# Patient Record
Sex: Female | Born: 1955 | Race: White | Hispanic: No | Marital: Married | Smoking: Current every day smoker
Health system: Southern US, Community
[De-identification: ages and names within clinical notes are randomized; demographics above are authoritative.]

## PROBLEM LIST (undated history)

## (undated) DIAGNOSIS — E860 Dehydration: Secondary | ICD-10-CM

## (undated) DIAGNOSIS — I10 Essential (primary) hypertension: Secondary | ICD-10-CM

## (undated) DIAGNOSIS — M5126 Other intervertebral disc displacement, lumbar region: Secondary | ICD-10-CM

## (undated) DIAGNOSIS — F32A Depression, unspecified: Secondary | ICD-10-CM

## (undated) DIAGNOSIS — G894 Chronic pain syndrome: Secondary | ICD-10-CM

## (undated) DIAGNOSIS — M545 Low back pain, unspecified: Secondary | ICD-10-CM

## (undated) DIAGNOSIS — I82409 Acute embolism and thrombosis of unspecified deep veins of unspecified lower extremity: Secondary | ICD-10-CM

## (undated) DIAGNOSIS — R252 Cramp and spasm: Secondary | ICD-10-CM

## (undated) DIAGNOSIS — F419 Anxiety disorder, unspecified: Secondary | ICD-10-CM

## (undated) DIAGNOSIS — Z8719 Personal history of other diseases of the digestive system: Secondary | ICD-10-CM

## (undated) DIAGNOSIS — N183 Chronic kidney disease, stage 3 (moderate): Secondary | ICD-10-CM

## (undated) DIAGNOSIS — F329 Major depressive disorder, single episode, unspecified: Secondary | ICD-10-CM

## (undated) HISTORY — DX: Other intervertebral disc displacement, lumbar region: M51.26

## (undated) HISTORY — DX: Low back pain: M54.5

## (undated) HISTORY — DX: Low back pain, unspecified: M54.50

## (undated) HISTORY — DX: Major depressive disorder, single episode, unspecified: F32.9

## (undated) HISTORY — DX: Chronic kidney disease, stage 3 (moderate): N18.3

## (undated) HISTORY — DX: Depression, unspecified: F32.A

## (undated) HISTORY — PX: LUMBAR LAMINECTOMY: SHX95

## (undated) HISTORY — DX: Personal history of other diseases of the digestive system: Z87.19

## (undated) HISTORY — DX: Chronic pain syndrome: G89.4

## (undated) HISTORY — DX: Cramp and spasm: R25.2

## (undated) HISTORY — DX: Anxiety disorder, unspecified: F41.9

## (undated) HISTORY — DX: Essential (primary) hypertension: I10

## (undated) HISTORY — DX: Acute embolism and thrombosis of unspecified deep veins of unspecified lower extremity: I82.409

## (undated) HISTORY — DX: Dehydration: E86.0

---

## 1999-06-15 ENCOUNTER — Ambulatory Visit (HOSPITAL_COMMUNITY): Admission: RE | Admit: 1999-06-15 | Discharge: 1999-06-15 | Payer: Self-pay | Admitting: Neurosurgery

## 1999-06-15 ENCOUNTER — Encounter: Payer: Self-pay | Admitting: Neurosurgery

## 1999-08-12 ENCOUNTER — Encounter: Payer: Self-pay | Admitting: Neurosurgery

## 1999-08-14 ENCOUNTER — Encounter: Payer: Self-pay | Admitting: Neurosurgery

## 1999-08-14 ENCOUNTER — Inpatient Hospital Stay (HOSPITAL_COMMUNITY): Admission: RE | Admit: 1999-08-14 | Discharge: 1999-08-17 | Payer: Self-pay | Admitting: Neurosurgery

## 1999-12-02 ENCOUNTER — Encounter: Payer: Self-pay | Admitting: Neurosurgery

## 1999-12-02 ENCOUNTER — Encounter: Admission: RE | Admit: 1999-12-02 | Discharge: 1999-12-02 | Payer: Self-pay | Admitting: Neurosurgery

## 2000-02-01 ENCOUNTER — Encounter: Admission: RE | Admit: 2000-02-01 | Discharge: 2000-02-01 | Payer: Self-pay | Admitting: Neurosurgery

## 2000-02-01 ENCOUNTER — Encounter: Payer: Self-pay | Admitting: Neurosurgery

## 2000-02-28 ENCOUNTER — Encounter: Payer: Self-pay | Admitting: Neurosurgery

## 2000-02-28 ENCOUNTER — Ambulatory Visit (HOSPITAL_COMMUNITY): Admission: RE | Admit: 2000-02-28 | Discharge: 2000-02-28 | Payer: Self-pay | Admitting: Neurosurgery

## 2000-06-17 ENCOUNTER — Encounter: Admission: RE | Admit: 2000-06-17 | Discharge: 2000-09-15 | Payer: Self-pay | Admitting: Anesthesiology

## 2000-10-06 ENCOUNTER — Encounter: Admission: RE | Admit: 2000-10-06 | Discharge: 2000-12-24 | Payer: Self-pay | Admitting: Anesthesiology

## 2004-10-30 ENCOUNTER — Emergency Department (HOSPITAL_COMMUNITY): Admission: EM | Admit: 2004-10-30 | Discharge: 2004-10-30 | Payer: Self-pay | Admitting: Family Medicine

## 2005-05-11 ENCOUNTER — Emergency Department (HOSPITAL_COMMUNITY): Admission: EM | Admit: 2005-05-11 | Discharge: 2005-05-11 | Payer: Self-pay | Admitting: Family Medicine

## 2005-08-17 ENCOUNTER — Emergency Department (HOSPITAL_COMMUNITY): Admission: EM | Admit: 2005-08-17 | Discharge: 2005-08-17 | Payer: Self-pay | Admitting: Family Medicine

## 2005-09-02 ENCOUNTER — Emergency Department (HOSPITAL_COMMUNITY): Admission: EM | Admit: 2005-09-02 | Discharge: 2005-09-02 | Payer: Self-pay | Admitting: Family Medicine

## 2005-09-03 ENCOUNTER — Emergency Department (HOSPITAL_COMMUNITY): Admission: EM | Admit: 2005-09-03 | Discharge: 2005-09-03 | Payer: Self-pay | Admitting: Family Medicine

## 2005-09-21 ENCOUNTER — Ambulatory Visit: Payer: Self-pay | Admitting: Internal Medicine

## 2005-09-23 ENCOUNTER — Ambulatory Visit (HOSPITAL_COMMUNITY): Admission: RE | Admit: 2005-09-23 | Discharge: 2005-09-23 | Payer: Self-pay | Admitting: Internal Medicine

## 2005-11-24 ENCOUNTER — Emergency Department (HOSPITAL_COMMUNITY): Admission: EM | Admit: 2005-11-24 | Discharge: 2005-11-24 | Payer: Self-pay | Admitting: Family Medicine

## 2006-06-17 ENCOUNTER — Emergency Department (HOSPITAL_COMMUNITY): Admission: EM | Admit: 2006-06-17 | Discharge: 2006-06-17 | Payer: Self-pay | Admitting: Family Medicine

## 2006-08-05 ENCOUNTER — Emergency Department (HOSPITAL_COMMUNITY): Admission: EM | Admit: 2006-08-05 | Discharge: 2006-08-05 | Payer: Self-pay | Admitting: Emergency Medicine

## 2007-10-27 ENCOUNTER — Emergency Department (HOSPITAL_COMMUNITY): Admission: EM | Admit: 2007-10-27 | Discharge: 2007-10-27 | Payer: Self-pay | Admitting: Emergency Medicine

## 2009-06-03 ENCOUNTER — Emergency Department (HOSPITAL_COMMUNITY): Admission: EM | Admit: 2009-06-03 | Discharge: 2009-06-03 | Payer: Self-pay | Admitting: Family Medicine

## 2009-07-16 ENCOUNTER — Encounter: Payer: Self-pay | Admitting: Internal Medicine

## 2009-07-16 ENCOUNTER — Ambulatory Visit: Payer: Self-pay | Admitting: Internal Medicine

## 2009-07-16 ENCOUNTER — Telehealth: Payer: Self-pay | Admitting: Internal Medicine

## 2009-07-16 DIAGNOSIS — I10 Essential (primary) hypertension: Secondary | ICD-10-CM | POA: Insufficient documentation

## 2009-07-16 DIAGNOSIS — M5126 Other intervertebral disc displacement, lumbar region: Secondary | ICD-10-CM

## 2009-07-16 DIAGNOSIS — F418 Other specified anxiety disorders: Secondary | ICD-10-CM

## 2009-07-16 DIAGNOSIS — F329 Major depressive disorder, single episode, unspecified: Secondary | ICD-10-CM

## 2009-07-16 DIAGNOSIS — N189 Chronic kidney disease, unspecified: Secondary | ICD-10-CM

## 2009-07-16 DIAGNOSIS — F32A Depression, unspecified: Secondary | ICD-10-CM | POA: Insufficient documentation

## 2009-07-16 HISTORY — DX: Other intervertebral disc displacement, lumbar region: M51.26

## 2009-07-17 ENCOUNTER — Telehealth: Payer: Self-pay | Admitting: Internal Medicine

## 2009-07-17 LAB — CONVERTED CEMR LAB
ALT: 10 units/L (ref 0–35)
AST: 11 units/L (ref 0–37)
Barbiturate Quant, Ur: NEGATIVE
Benzodiazepines.: POSITIVE — AB
Chloride: 102 meq/L (ref 96–112)
Creatinine,U: 41.5 mg/dL
Glucose, Bld: 124 mg/dL — ABNORMAL HIGH (ref 70–99)
Methadone: NEGATIVE
Opiates: POSITIVE — AB
Sodium: 137 meq/L (ref 135–145)

## 2009-10-07 ENCOUNTER — Ambulatory Visit (HOSPITAL_COMMUNITY): Admission: RE | Admit: 2009-10-07 | Discharge: 2009-10-07 | Payer: Self-pay | Admitting: Internal Medicine

## 2009-10-07 ENCOUNTER — Ambulatory Visit: Payer: Self-pay | Admitting: Internal Medicine

## 2009-10-16 ENCOUNTER — Ambulatory Visit (HOSPITAL_COMMUNITY): Admission: RE | Admit: 2009-10-16 | Discharge: 2009-10-16 | Payer: Self-pay | Admitting: Internal Medicine

## 2009-10-17 ENCOUNTER — Emergency Department (HOSPITAL_COMMUNITY): Admission: EM | Admit: 2009-10-17 | Discharge: 2009-10-17 | Payer: Self-pay | Admitting: Family Medicine

## 2009-10-22 ENCOUNTER — Telehealth: Payer: Self-pay | Admitting: *Deleted

## 2009-10-28 ENCOUNTER — Telehealth: Payer: Self-pay | Admitting: *Deleted

## 2009-11-06 ENCOUNTER — Ambulatory Visit: Payer: Self-pay | Admitting: Internal Medicine

## 2009-11-10 ENCOUNTER — Encounter: Payer: Self-pay | Admitting: Internal Medicine

## 2009-11-13 ENCOUNTER — Telehealth: Payer: Self-pay | Admitting: *Deleted

## 2009-11-14 ENCOUNTER — Encounter: Payer: Self-pay | Admitting: Internal Medicine

## 2009-11-21 ENCOUNTER — Telehealth: Payer: Self-pay | Admitting: *Deleted

## 2009-11-27 ENCOUNTER — Telehealth: Payer: Self-pay | Admitting: Internal Medicine

## 2009-12-03 ENCOUNTER — Telehealth: Payer: Self-pay | Admitting: *Deleted

## 2010-01-08 ENCOUNTER — Telehealth: Payer: Self-pay | Admitting: Internal Medicine

## 2010-01-29 ENCOUNTER — Telehealth: Payer: Self-pay | Admitting: Internal Medicine

## 2010-02-09 ENCOUNTER — Ambulatory Visit: Payer: Self-pay | Admitting: Internal Medicine

## 2010-02-09 DIAGNOSIS — F17200 Nicotine dependence, unspecified, uncomplicated: Secondary | ICD-10-CM

## 2010-02-18 ENCOUNTER — Encounter
Admission: RE | Admit: 2010-02-18 | Discharge: 2010-03-02 | Payer: Self-pay | Source: Home / Self Care | Attending: Physical Medicine & Rehabilitation | Admitting: Physical Medicine & Rehabilitation

## 2010-02-20 ENCOUNTER — Telehealth: Payer: Self-pay | Admitting: Licensed Clinical Social Worker

## 2010-02-23 ENCOUNTER — Encounter: Payer: Self-pay | Admitting: Internal Medicine

## 2010-02-23 ENCOUNTER — Telehealth (INDEPENDENT_AMBULATORY_CARE_PROVIDER_SITE_OTHER): Payer: Self-pay | Admitting: *Deleted

## 2010-02-23 ENCOUNTER — Ambulatory Visit: Payer: Self-pay | Admitting: Internal Medicine

## 2010-02-23 ENCOUNTER — Ambulatory Visit (HOSPITAL_COMMUNITY): Admission: RE | Admit: 2010-02-23 | Discharge: 2010-02-23 | Payer: Self-pay | Admitting: Internal Medicine

## 2010-02-23 LAB — CONVERTED CEMR LAB
CO2: 25 meq/L (ref 19–32)
Chloride: 102 meq/L (ref 96–112)
Glucose, Bld: 113 mg/dL — ABNORMAL HIGH (ref 70–99)
MCHC: 33.5 g/dL (ref 30.0–36.0)
MCV: 90.4 fL (ref >=78.0)
Potassium: 4.1 meq/L (ref 3.5–5.3)
RDW: 12.5 % (ref 11.5–15.5)
Sodium: 136 meq/L (ref 135–145)
Total Bilirubin: 0.4 mg/dL (ref 0.3–1.2)
VLDL: 24 mg/dL (ref 0–40)
WBC: 9.5 10*3/uL (ref 4.0–10.5)

## 2010-03-02 ENCOUNTER — Ambulatory Visit: Payer: Self-pay | Admitting: Physical Medicine & Rehabilitation

## 2010-03-06 ENCOUNTER — Telehealth: Payer: Self-pay | Admitting: Internal Medicine

## 2010-03-12 ENCOUNTER — Telehealth: Payer: Self-pay | Admitting: Internal Medicine

## 2010-03-16 ENCOUNTER — Encounter: Payer: Self-pay | Admitting: Internal Medicine

## 2010-03-16 ENCOUNTER — Ambulatory Visit: Payer: Self-pay | Admitting: Internal Medicine

## 2010-03-18 LAB — CONVERTED CEMR LAB
Amphetamine Screen, Ur: NEGATIVE
Benzodiazepines.: POSITIVE — AB
Methadone: NEGATIVE

## 2010-03-23 ENCOUNTER — Encounter: Payer: Self-pay | Admitting: Internal Medicine

## 2010-03-23 ENCOUNTER — Encounter: Payer: Self-pay | Admitting: Licensed Clinical Social Worker

## 2010-04-06 ENCOUNTER — Ambulatory Visit: Payer: Self-pay | Admitting: Family Medicine

## 2010-04-13 ENCOUNTER — Telehealth: Payer: Self-pay | Admitting: *Deleted

## 2010-04-15 ENCOUNTER — Encounter: Payer: Self-pay | Admitting: Internal Medicine

## 2010-04-15 ENCOUNTER — Ambulatory Visit: Payer: Self-pay | Admitting: Internal Medicine

## 2010-04-15 LAB — CONVERTED CEMR LAB
Amphetamine Screen, Ur: NEGATIVE
Barbiturate Quant, Ur: NEGATIVE
Benzodiazepines.: NEGATIVE
Cocaine Metabolites: NEGATIVE
Marijuana Metabolite: NEGATIVE
Opiates: POSITIVE — AB
Phencyclidine (PCP): NEGATIVE
Propoxyphene: NEGATIVE

## 2010-05-13 ENCOUNTER — Telehealth: Payer: Self-pay | Admitting: *Deleted

## 2010-05-13 ENCOUNTER — Encounter: Payer: Self-pay | Admitting: Internal Medicine

## 2010-05-13 ENCOUNTER — Telehealth: Payer: Self-pay | Admitting: Internal Medicine

## 2010-05-17 ENCOUNTER — Encounter: Payer: Self-pay | Admitting: Internal Medicine

## 2010-05-26 NOTE — Letter (Signed)
Summary: BACK PAIN [facility]  BACK PAIN [facility]   Imported By: Margie Billet 03/23/2010 15:42:57  _____________________________________________________________________  External Attachment:    Type:   Image     Comment:   External Document

## 2010-05-26 NOTE — Assessment & Plan Note (Signed)
Summary: EST-2 WEEK RECHECK/CH   Vital Signs:  Patient profile:   55 year old female Height:      66 inches (167.64 cm) Weight:      195.0 pounds (88.64 kg) BMI:     31.59 Temp:     9806 degrees F (5430 degrees C) oral Pulse rate:   71 / minute BP sitting:   124 / 83  (right arm) Cuff size:   regular  Vitals Entered By: Theotis Barrio NT II (November 06, 2009 4:08 PM) CC: COMPLAIN OF NECK-BACK-KNEES-LEFT ELBOW- PAIN /  2 WEEKK FOLLOW UP APPT,  Is Patient Diabetic? No Pain Assessment Patient in pain? yes     Location: NECK,BACK,KNEE, ELBOW Intensity:  8  /   7 Onset of pain  Chronic Nutritional Status BMI of > 30 = obese  Have you ever been in a relationship where you felt threatened, hurt or afraid?No   Does patient need assistance? Functional Status Self care Ambulation Normal   Primary Care Provider:  Lars Mage MD  CC:  COMPLAIN OF NECK-BACK-KNEES-LEFT ELBOW- PAIN /  2 WEEKK FOLLOW UP APPT and .  History of Present Illness: 55 year old woman with PMH of chronic back and neck pain, history of positive UDS with opioids and benzodiazepines without presciption, and insomnia presents for a visit with following complaints:  Neck pain: Shooting, stabbing pain in C7 to T2 region. Feels like ice picks on both sides. Has been worsened for the last 10 days to 2 weeks. Pain comes and goes (sharp pain). Pain in C7 has been constant for at least 2 months. Certain movements exacerbate the pain. Flexion of neck and lateral motion worsen the pain. Extension of neck helps the pain some, but does not take it away completely Heat and cold do not help it. Vibration does not help. At least 6/10, at worst 10/10. She take 2-3 tabs of Morphine 30mg  from her friend and also takes Benzos(Xanax)  occasionally again borrowed from her friend.  Went to urgent care center 3 weeks ago for left elbow pain and wearing brace today. She said that she was advised to go to an orthopedician.  Lower back pain s/p  lumbar laminectomy: R side near hip - sharp and burning pain. When not sharp and burning, aching, feels like a throbbing toothache like pain. Has had the pain evaluated in the past, in same area again today. Has been ongoing at more severe level of intensity for the 10-14 days. At least 6/10, at worst 10/10  Insomnia: Feels worn out - has been sleeping less and less. 3-4 hours of sleep per night. Insomnia has been ongoing for "quite a while now". Has gotten worse over the past 3-4 months. Has not tried anything. As for medications taken in the past, xanax seemed to help in the past. Had trouble after surgery in the past with sleep. Tried neurontin, topamax and seroquel - had problems with first two, but did well with seroquel. Agreed to try seroquel today.  Smokes 1/2 to 1 ppd and not reday to quit. Says that she quit 7 years ago and will try again. Does not need help for quitting.  Depression History:      The patient denies a depressed mood most of the day and a diminished interest in her usual daily activities.         Preventive Screening-Counseling & Management  Alcohol-Tobacco     Smoking Status: current     Smoking Cessation Counseling: yes  Packs/Day: 0.75  Caffeine-Diet-Exercise     Does Patient Exercise: yes     Type of exercise: WALKING DOGS 3  Problems Prior to Update: 1)  Insomnia Unspecified  (ICD-780.52) 2)  Chronic Kidney Disease Unspecified  (ICD-585.9) 3)  Low Back Pain  (ICD-724.2) 4)  Hypertension  (ICD-401.9) 5)  Depression  (ICD-311) 6)  Anxiety  (ICD-300.00) 7)  Abscess, Tooth  (ICD-522.5) 8)  Inadequate Material Resources  (ICD-V60.2) 9)  Neck Pain, Chronic  (ICD-723.1) 10)  Depression, Hx of  (ICD-V11.8) 11)  Back Pain, Chronic  (ICD-724.5)  Medications Prior to Update: 1)  Lisinopril 20 Mg Tabs (Lisinopril) .... Take 1 Tablet By Mouth Once A Day 2)  Trazodone Hcl 150 Mg Tabs (Trazodone Hcl) .... Take 1 Tab By Mouth At Bedtime As Needed For  Insomnia  Current Medications (verified): 1)  Lisinopril 20 Mg Tabs (Lisinopril) .... Take 1 Tablet By Mouth Once A Day 2)  Seroquel 100 Mg Tabs (Quetiapine Fumarate) .... Take 1 Tab By Mouth Once Daily 3)  Cyclobenzaprine Hcl 5 Mg Tabs (Cyclobenzaprine Hcl) .... .1-2 Tabs By Mouth Every 8-12 Hours As Needed For Back Pain.  Allergies (verified): 1)  ! Steroids 2)  ! Ambien (Zolpidem Tartrate) 3)  ! Sulfa 4)  ! Neurontin (Gabapentin) 5)  ! Amoxicillin 6)  ! Ultram  Past History:  Past Medical History: Last updated: 07/16/2009 Anxiety Depression Hypertension Low back pain  Past Surgical History: Last updated: 07/16/2009 Lumbar laminectomy  Social History: Last updated: 10/07/2009 Former hairdresser, currently unemployed. Live at home with husband and 3 dogs Smokes 1 ppd Does not drink alcohol Denies any other drug abuse  Risk Factors: Exercise: yes (11/06/2009)  Risk Factors: Smoking Status: current (11/06/2009) Packs/Day: 0.75 (11/06/2009)  Social History: Does Patient Exercise:  yes  Review of Systems      See HPI  Physical Exam  Additional Exam:  Gen: AOx3, in no acute distress Eyes: PERRL, EOMI ENT:MMM, No erythema noted in posterior pharynx Neck: No JVD, No LAP Chest: CTAB with  good respiratory effort CVS: regular rhythmic rate, NO M/R/G, S1 S2 normal Abdo: soft,ND, BS+x4, Non tender and No hepatosplenomegaly EXT: No odema noted Neuro: Non focal, using cane for assistance in walking Skin: no rashes noted.    Impression & Recommendations:  Problem # 1:  INSOMNIA UNSPECIFIED (ICD-780.52) Assessment Deteriorated I will prescribe her seroquel that she was on before and it use to help. She asidthat she used 100 mg tabs and cut them into quarters as its cheaper for her. I will prescribe her 30 tabs of 100 mg today. Discussed sleep hygiene.   Problem # 2:  HYPERTENSION (ICD-401.9) Assessment: Unchanged Well controlled on current meds. Bmet with  in last 6 months. Her updated medication list for this problem includes:    Lisinopril 20 Mg Tabs (Lisinopril) .Marland Kitchen... Take 1 tablet by mouth once a day  BP today: 124/83 Prior BP: 120/80 (10/07/2009)  Labs Reviewed: K+: 4.2 (07/16/2009) Creat: : 1.42 (07/16/2009)     Problem # 3:  DEPRESSION (ICD-311) Assessment: Comment Only She was prescribed Trazodone but she never refilled the presription because she thinks that it will not work with her. I will prescribe her seroquel today and will follow up. She is not suicidal or homicidal at this time. She will need a psych evaluation in future as I feel she will benefit from behaviour modification and psychotherapy.   The following medications were removed from the medication list:    Trazodone Hcl  150 Mg Tabs (Trazodone hcl) .Marland Kitchen... Take 1 tab by mouth at bedtime as needed for insomnia  Discussed treatment options, including trial of antidpressant medication. Will refer to behavioral health. Follow-up call in in 24-48 hours and recheck in 2 weeks, sooner as needed. Patient agrees to call if any worsening of symptoms or thoughts of doing harm arise. Verified that the patient has no suicidal ideation at this time.   Problem # 4:  NECK PAIN, CHRONIC (ICD-723.1) Assessment: Comment Only MRI reviewed and I discused the results with her. Will refer her to Pain clinic today. Discussed with Dr Aundria Rud. She is taking non prescribed narcotics and Xanax. I advised her to stop using non prescribed meds but she says that she need something to control her pains.  Her updated medication list for this problem includes:    Cyclobenzaprine Hcl 5 Mg Tabs (Cyclobenzaprine hcl) ..... .1-2 tabs by mouth every 8-12 hours as needed for back pain.  Orders: Pain Clinic Referral (Pain)  Discussed exercises and use of moist heat or cold and medication.   Problem # 5:  Preventive Health Care (ICD-V70.0) Assessment: Comment Only Tetanus and hemmoccults cards given  today.  Complete Medication List: 1)  Lisinopril 20 Mg Tabs (Lisinopril) .... Take 1 tablet by mouth once a day 2)  Seroquel 100 Mg Tabs (Quetiapine fumarate) .... Take 1 tab by mouth once daily 3)  Cyclobenzaprine Hcl 5 Mg Tabs (Cyclobenzaprine hcl) .... .1-2 tabs by mouth every 8-12 hours as needed for back pain.  Other Orders: T-Hemoccult Card-Multiple (take home) ([id number]) Tdap => 83yrs IM (19147) Prescriptions: CYCLOBENZAPRINE HCL 5 MG TABS (CYCLOBENZAPRINE HCL) .1-2 tabs by mouth every 8-12 hours as needed for back pain.  #60 x 0   Entered and Authorized by:   Lars Mage MD   Signed by:   Lars Mage MD on 11/06/2009   Method used:   Print then Give to Patient   RxID:   [id number] SEROQUEL 100 MG TABS (QUETIAPINE FUMARATE) take 1 tab by mouth once daily  #30 x 0   Entered and Authorized by:   Lars Mage MD   Signed by:   Lars Mage MD on 11/06/2009   Method used:   Print then Give to Patient   RxID:   [id number]   Prevention & Chronic Care Immunizations   Influenza vaccine: Not documented   Influenza vaccine deferral: Deferred  (11/06/2009)    Tetanus booster: 11/06/2009: Tdap   Td booster deferral: Deferred  (07/16/2009)    Pneumococcal vaccine: Not documented  Colorectal Screening   Hemoccult: Not documented   Hemoccult action/deferral: Ordered  (11/06/2009)    Colonoscopy: Not documented   Colonoscopy action/deferral: Refused  (11/06/2009)  Other Screening   Pap smear: Not documented   Pap smear action/deferral: GYN Referral  (10/07/2009)    Mammogram: Not documented   Mammogram action/deferral: Ordered  (10/07/2009)   Smoking status: current  (11/06/2009)   Smoking cessation counseling: yes  (11/06/2009)  Lipids   Total Cholesterol: Not documented   Lipid panel action/deferral: Deferred   LDL: Not documented   LDL Direct: Not documented   HDL: Not documented   Triglycerides: Not documented  Hypertension   Last Blood Pressure: 124 / 83   (11/06/2009)   Serum creatinine: 1.42  (07/16/2009)   Serum potassium 4.2  (07/16/2009)    Hypertension flowsheet reviewed?: Yes   Progress toward BP goal: At goal  Self-Management Support :   Personal Goals (by the next clinic visit) :  Personal blood pressure goal: 140/90  (07/16/2009)   Patient will work on the following items until the next clinic visit to reach self-care goals:     Medications and monitoring: take my medicines every day, bring all of my medications to every visit  (11/06/2009)     Eating: drink diet soda or water instead of juice or soda, eat more vegetables, use fresh or frozen vegetables, eat foods that are low in salt, eat baked foods instead of fried foods, limit or avoid alcohol  (11/06/2009)    Hypertension self-management support: Written self-care plan, Resources for patients handout  (11/06/2009)   Hypertension self-care plan printed.    Self-management comments: PATIET STATES SHE WALKS THE DOGS A SHORT DISTANCE      Resource handout printed.   Nursing Instructions: Give tetanus booster today Provide Hemoccult cards with instructions (see order)     Tetanus/Td Vaccine    Vaccine Type: Tdap    Site: left deltoid    Mfr: [employer]    Dose: 0.5 ml    Route: IM    Given by: Stanton Kidney Ditzler RN    Exp. Date: 10/24/2011    Lot #: [id number]    VIS given: 03/14/07 version given November 06, 2009.

## 2010-05-26 NOTE — Assessment & Plan Note (Signed)
Summary: NEW PT TO CLINIC;INFO IN ECHART/DS/HTN   Vital Signs:  Patient profile:   55 year old female Height:      66 inches (167.64 cm) Weight:      197.8 pounds (89.91 kg) BMI:     32.04 Temp:     99.3 degrees F (37.39 degrees C) oral Pulse rate:   77 / minute BP sitting:   107 / 73  (right arm) Cuff size:   large  Vitals Entered By: [name]) (July 16, 2009 10:42 AM) CC: new to clinic, c/o tension HAs, back pain x "years" Is Patient Diabetic? No Pain Assessment Patient in pain? 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 Functional Status Self care Ambulation Impaired:Risk for fall Comments cane   Primary Care Provider:  Lars Mage MD  CC:  new to clinic, c/o tension HAs, and back pain x "years".  History of Present Illness: Marie Willis previously known as Marie Willis is here today to [facility] care with [place]. She has past medicical history most notable for severe back pain 2/2 herniated lumbar disc s/p op in 2001 followed by Dr Ricka Burdock for a while,. apparaently there were complications to the procedure and she had to be bed bound alomost 6-8 months. She was chronic pain meds untill 2007 is oxycintin 40 mg bid  and stopped following with them due to lack of resources.  She was following with Dr Jorge Ny at [facility] who was the last orthopedics who saw her in 2008.   She comes in today with this neck pain, she says that she has ahd that for almost 15 years now and it comes and goes in bouts. The latest bout started about 2 months ago when she started having pain in the neck region, sharp, radiating to left shoulder, 8/10 at its worse, no aggravating factors and relieved by taking narcotics borrowed  from her friends. No tingling or numbness in her left arm. The pain has been persistent and progressively getting worse.  Smokes 1/2 a pack a day and not ready to quit at this time.  Preventive Screening-Counseling & Management  Alcohol-Tobacco     Smoking Status: current     Smoking Cessation Counseling: yes     Packs/Day: 0.75  Problems Prior to Update: None  Medications Prior to Update: 1)  None  Current Medications (verified): 1)  Lisinopril 20 Mg Tabs (Lisinopril) .... Take 1 Tablet By Mouth Once A Day 2)  Cyclobenzaprine Hcl 5 Mg Tabs (Cyclobenzaprine Hcl) .... .take 1-2 Tabs Every 8 Hours For Muscle Relaxation As Needed 3)  Clindamycin Hcl 300 Mg Caps (Clindamycin Hcl) .... Take 2 Tabs Every 8 Hours For 7 Days 4)  Tramadol Hcl 50 Mg Tabs (Tramadol Hcl) .... Take 1 Tab Every 6 Hours As Needed For Pain Control  Allergies (verified): 1)  ! Steroids 2)  ! Ambien (Zolpidem Tartrate) 3)  ! Sulfa 4)  ! Neurontin (Gabapentin) 5)  ! Amoxicillin  Past History:  Past Medical History: Anxiety Depression Hypertension Low back pain  Past Surgical History: Lumbar laminectomy  Social History: Smoking Status:  current Packs/Day:  0.75  Review of Systems      See HPI  Physical Exam  Additional Exam:  Gen: AOx3, in some acute distress, uses cane for walking Eyes: PERRL, EOMI ENT: MMM, No erythema noted in posterior pharynx Neck: No JVD, No LAP,  Chest: CTAB with  good respiratory effort CVS: regular rhythmic rate, NO M/R/G, S1 S2 normal Abdo: soft,ND, BS+x4, Non tender and No hepatosplenomegaly EXT: No odema noted Neuro: Non focal, gait is altered due to back pain Skin: no rashes noted.  back: She has lower lumbar tenderness along with paravertebral muscle tightness                        Cervical tenderness along with paraspinal muscle tenderness.   Impression & Recommendations:  Problem # 1:  LOW BACK PAIN (ICD-724.2) Assessment Unchanged Patient ahs had a  very stormy course of illness and her back pain has been a big issue in her life during last 10 years. 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 She was on oxycontin 40mg  Bid for a long period of time untill she ran out of money and could not afford pain Clinic. I have asked for records from pain clinic, ortho office, neurology clinic and headache clinic today. She says that she has been taking narcotics pain meds from her meds every now and then for last 3 years.  Her updated medication list for this problem includes:    Cyclobenzaprine Hcl 5 Mg Tabs (Cyclobenzaprine hcl) ..... Marland Kitchentake 1-2 tabs every 8 hours for muscle relaxation as needed    Tramadol Hcl 50 Mg Tabs (Tramadol hcl) .Marland Kitchen... Take 1 tab every 6 hours as needed for pain control  Discussed use of moist heat or ice, modified activities, medications, and stretching/strengthening exercises. Back care instructions given. To be seen in 2 weeks if no improvement; sooner if worsening of symptoms.   Problem # 2:  NECK PAIN, CHRONIC (ICD-723.1) Assessment: Deteriorated We do not have any record of her neck pain and there are no images in the echart. I discussed that she may require an MRI for fresh evaluation of her neck. She does have objective evidence of tenderness in C7 area and muscle spasm/stiffness all around. I will prescribe her flexeril for 2-3 weaks along with some tramadol. I discussed warm compresses and hot messages as some of the things which might help with the spasm. The decision was made not to start any Narcotics at this time since we know very little about her.  We ran her name through Narcotic database and she did recieve few narcotics in Feb fro dental abscess but she hasnt been on any narcotics for atleast last 6 months and we would like to maintain her like that. We will get a UDS today. Her updated medication list for this problem includes:    Cyclobenzaprine Hcl 5 Mg Tabs  (Cyclobenzaprine hcl) ..... Marland Kitchentake 1-2 tabs every 8 hours for muscle relaxation as needed    Tramadol Hcl 50 Mg Tabs (Tramadol hcl) .Marland Kitchen... Take 1 tab every 6 hours as needed for pain control  Problem # 3:  HYPERTENSION (ICD-401.9) Assessment: Improved Well controlled. Will refill her meds and obtain Cmet for K and Crt. Her updated medication list for this problem includes:    Lisinopril 20 Mg Tabs (Lisinopril) .Marland Kitchen... Take 1 tablet by mouth once a day  BP today: 107/73  Problem # 4:  ABSCESS, TOOTH (ICD-522.5) Assessment: Deteriorated She does complain of soreness in her left lower tooth  region along with fevers. Her temp is 99.6. I will give her prescription of Clindamycin for 7 days at this time for treatment of possible abscess. Orders: T-HIV Antibody  (Reflex) ([id number])  Problem # 5:  INADEQUATE MATERIAL RESOURCES (ICD-V60.2) Assessment: Comment Only Patrecia Pace e debra hill for assistance.  Problem # 6:  Preventive Health Care (ICD-V70.0) Assessment: Comment Only Not discussed during this visit.  Complete Medication List: 1)  Lisinopril 20 Mg Tabs (Lisinopril) .... Take 1 tablet by mouth once a day 2)  Cyclobenzaprine Hcl 5 Mg Tabs (Cyclobenzaprine hcl) .... .take 1-2 tabs every 8 hours for muscle relaxation as needed 3)  Clindamycin Hcl 300 Mg Caps (Clindamycin hcl) .... Take 2 tabs every 8 hours for 7 days 4)  Tramadol Hcl 50 Mg Tabs (Tramadol hcl) .... Take 1 tab every 6 hours as needed for pain control  Other Orders: T-Comprehensive Metabolic Panel ([id number]) T-Drug Screen-Urine, (single) ([id number])  Patient Instructions: 1)  Please schedule a follow-up appointment as needed. 2)  Limit your Sodium (Salt). 3)  Tobacco is very bad for your health and your loved ones! You Should stop smoking!. 4)  Stop Smoking Tips: Choose a Quit date. Cut down before the Quit date. decide what you will do as a substitute when you feel the urge to smoke(gum,toothpick,exercise). 5)  You  need to lose weight. Consider a lower calorie diet and regular exercise.  6)  Schedule your mammogram. 7)  Schedule a colonoscopy/sigmoidoscopy to help detect colon cancer. 8)  You need to have a Pap Smear to prevent cervical cancer. 9)  Check your Blood Pressure regularly. If it is above: you should make an appointment. 10)  Take your antibiotic as prescribed until ALL of it is gone, but stop if you develop a rash or swelling and contact our office as soon as possible. Prescriptions: CLINDAMYCIN HCL 300 MG CAPS (CLINDAMYCIN HCL) take 2 tabs every 6 hours for 7 days  #42 x 0   Entered and Authorized by:   Lars Mage MD   Signed by:   Lars Mage MD on 07/16/2009   Method used:   Print then Give to Patient   RxID:   [id number] TRAMADOL HCL 50 MG TABS (TRAMADOL HCL) take 1 tab every 6 hours as needed for pain control  #60 x 0   Entered and Authorized by:   Lars Mage MD   Signed by:   Lars Mage MD on 07/16/2009   Method used:   Print then Give to Patient   RxID:   [id number] CLINDAMYCIN HCL 300 MG CAPS (CLINDAMYCIN HCL) take 2 tabs every 6 hours for 7 days  #56 x 0   Entered and Authorized by:   Lars Mage MD   Signed by:   Lars Mage MD on 07/16/2009   Method used:   Print then Give to Patient   RxID:   [id number] CYCLOBENZAPRINE HCL 5 MG TABS (CYCLOBENZAPRINE HCL) .take 1-2 tabs every 8 hours for muscle relaxation as needed  #60 x 1   Entered and Authorized by:   Lars Mage MD   Signed by:   Lars Mage MD on 07/16/2009   Method used:   Print then Give to Patient   RxID:   [id number] LISINOPRIL 20 MG TABS (LISINOPRIL) Take 1 tablet by mouth once a day  #30 x 11   Entered and Authorized by:   Lars Mage MD   Signed by:   Lars Mage MD on 07/16/2009   Method used:  Print then Give to Patient   RxID:   [id number]  Process Orders Check Orders Results:     [facility]: ABN not required for this insurance Tests Sent for requisitioning (July 16, 2009 1:35 PM):     07/16/2009: [facility] -- T-Comprehensive Metabolic Panel [[id number]] (signed)     07/16/2009: [facility] -- T-HIV Antibody  (Reflex) [[id number]] (signed)     07/16/2009: [facility] -- T-Drug Screen-Urine, (single) [[id number]] (signed)    Prevention & Chronic Care Immunizations   Influenza vaccine: Not documented   Influenza vaccine deferral: Deferred  (07/16/2009)    Tetanus booster: Not documented   Td booster deferral: Deferred  (07/16/2009)    Pneumococcal vaccine: Not documented  Colorectal Screening   Hemoccult: Not documented   Hemoccult action/deferral: Deferred  (07/16/2009)    Colonoscopy: Not documented   Colonoscopy action/deferral: Deferred  (07/16/2009)  Other Screening   Pap smear: Not documented   Pap smear action/deferral: Deferred  (07/16/2009)    Mammogram: Not documented   Mammogram action/deferral: Deferred  (07/16/2009)   Smoking status: current  (07/16/2009)   Smoking cessation counseling: yes  (07/16/2009)  Lipids   Total Cholesterol: Not documented   Lipid panel action/deferral: Deferred   LDL: Not documented   LDL Direct: Not documented   HDL: Not documented   Triglycerides: Not documented  Hypertension   Last Blood Pressure: 107 / 73  (07/16/2009)   Serum creatinine: Not documented   Serum potassium Not documented CMP ordered     Hypertension flowsheet reviewed?: Yes   Progress toward BP goal: At goal  Self-Management Support :   Personal Goals (by the next clinic visit) :      Personal blood pressure goal: 140/90  (07/16/2009)   Patient will work on the following items until the next clinic visit to reach self-care goals:     Medications and monitoring: take my medicines every day  (07/16/2009)     Eating: eat foods that are low in salt, eat baked foods instead of fried foods  (07/16/2009)    Hypertension self-management support: Pre-printed  educational material, Education handout, Resources for patients handout  (07/16/2009)   Hypertension education handout printed      Resource handout printed.    Appended Document: Orders Update    Clinical Lists Changes  Problems: Added new problem of SPECIAL SCREENING FOR MALIGNANT NEOPLASMS COLON (ICD-V76.51) Added new problem of CHRONIC KIDNEY DISEASE UNSPECIFIED (ICD-585.9) Orders: Added new Test order of Mammogram (Screening) (Mammo) - Signed Added new Referral order of Gynecologic Referral (Gyn) - Signed Added new Referral order of Gastroenterology Referral (GI) - Signed Observations: Added new observation of MAMMRECACT: Ordered (07/16/2009 15:19) Added new observation of NURSEINSTR: GI referral for screening colonoscopy (see order) Schedule screening mammogram (see order)  (07/16/2009 15:19) Added new observation of COLONRECACT: GI referral (07/16/2009 15:19) Added new observation of DM PROGRESS: N/A (07/16/2009 15:19) Added new observation of DM FSREVIEW: N/A (07/16/2009 15:19) Added new observation of LIPID PROGRS: N/A (07/16/2009 15:19) Added new observation of LIPID FSREVW: N/A (07/16/2009 15:19)          Prevention & Chronic Care Immunizations   Influenza vaccine: Not documented   Influenza vaccine deferral: Deferred  (07/16/2009)    Tetanus booster: Not documented   Td booster deferral: Deferred  (07/16/2009)    Pneumococcal vaccine: Not documented  Colorectal Screening   Hemoccult: Not documented   Hemoccult action/deferral: Deferred  (07/16/2009)    Colonoscopy: Not documented  Colonoscopy action/deferral: GI referral  (07/16/2009)  Other Screening   Pap smear: Not documented   Pap smear action/deferral: Deferred  (07/16/2009)    Mammogram: Not documented   Mammogram action/deferral: Ordered  (07/16/2009)   Smoking status: current  (07/16/2009)   Smoking cessation counseling: yes  (07/16/2009)  Lipids   Total Cholesterol: Not  documented   Lipid panel action/deferral: Deferred   LDL: Not documented   LDL Direct: Not documented   HDL: Not documented   Triglycerides: Not documented  Hypertension   Last Blood Pressure: 107 / 73  (07/16/2009)   Serum creatinine: Not documented   Serum potassium Not documented  Self-Management Support :   Personal Goals (by the next clinic visit) :      Personal blood pressure goal: 140/90  (07/16/2009)   Hypertension self-management support: Pre-printed educational material, Education handout, Resources for patients handout  (07/16/2009)   Nursing Instructions: GI referral for screening colonoscopy (see order) Schedule screening mammogram (see order)

## 2010-05-26 NOTE — Assessment & Plan Note (Signed)
Summary: EST-1 MONTH F/U VISIT/CH   Vital Signs:  Patient profile:   55 year old female Height:      66 inches (167.64 cm) Weight:      205.9 pounds (93.59 kg) BMI:     33.35 Temp:     99.5 degrees F (37.50 degrees C) oral Pulse rate:   103 / minute BP sitting:   132 / 88  (right arm)  Vitals Entered By: [name] Duncan Dull) (March 16, 2010 2:45 PM) CC: f/u anxiety Is Patient Diabetic? No Pain Assessment Patient in pain? no      Nutritional Status BMI of > 30 = obese  Have you ever been in a relationship where you felt threatened, hurt or afraid?No   Does patient need assistance? Functional Status Self care Ambulation Normal   Primary Care Provider:  Lars Mage MD  CC:  f/u anxiety.  History of Present Illness: Patient is here today for a follow up appointment for back pain.  Back pain: She recently saw Dr Doroteo Bradford for pain management of her back and he discussed various treatment options.  She complains of severe back pain, worse with leaning forward and better with standing straight. Radiation to left leg and buttuck sometimes but never below knee joint. She takes morphine from her friend for pain control.  Smoking: 1 PPD and will try to quit.  Anxiety: Klonipin helped her and she is compliant with paxil.  Obesity: Gained 10 pounds over last 4 months. Doscissed weight loss options.   Shee denies any new sicknesses or hospitalizations, no chest pain episodes, no fevers, no chills, no abdominal or urinary concerns. No recent changes in appetite, weight.     Depression History:      The patient denies a depressed mood most of the day and a diminished interest in her usual daily activities.        Comments:  anxiety.   Preventive Screening-Counseling & Management  Alcohol-Tobacco     Smoking Status: current     Smoking Cessation Counseling: yes     Packs/Day: 1.0  Problems Prior to Update: 1)  Elbow Pain, Left  (ICD-719.42) 2)  Tobacco  User  (ICD-305.1) 3)  Insomnia Unspecified  (ICD-780.52) 4)  Chronic Kidney Disease Unspecified  (ICD-585.9) 5)  Low Back Pain  (ICD-724.2) 6)  Hypertension  (ICD-401.9) 7)  Depression  (ICD-311) 8)  Anxiety  (ICD-300.00) 9)  Abscess, Tooth  (ICD-522.5) 10)  Inadequate Material Resources  (ICD-V60.2) 11)  Neck Pain, Chronic  (ICD-723.1) 12)  Depression, Hx of  (ICD-V11.8) 13)  Back Pain, Chronic  (ICD-724.5)  Medications Prior to Update: 1)  Lisinopril 20 Mg Tabs (Lisinopril) .... Take 1 Tablet By Mouth Once A Day 2)  Cyclobenzaprine Hcl 5 Mg Tabs (Cyclobenzaprine Hcl) .... .1-2 Tabs By Mouth Every 8-12 Hours As Needed For Back Pain. 3)  Klonopin 0.5 Mg Tabs (Clonazepam) .... Take 1 Tablet By Mouth Two Times A Day As Needed For Anxiety. 4)  Paroxetine Hcl 20 Mg Tabs (Paroxetine Hcl) .... Take 2  Tablet By Mouth Once A Day  Current Medications (verified): 1)  Lisinopril 20 Mg Tabs (Lisinopril) .... Take 1 Tablet By Mouth Once A Day 2)  Paroxetine Hcl 20 Mg Tabs (Paroxetine Hcl) .... Take 2  Tablet By Mouth Once A Day At Bedtime 3)  Morphine Sulfate Cr 30 Mg Xr12h-Tab (Morphine Sulfate) .... Take 1 Tablet By Mouth Two Times A Day 4)  Morphine Sulfate 30 Mg Tabs (Morphine Sulfate) .Marland KitchenMarland KitchenMarland Kitchen  Take 1/2 Tab Every 6 Hours For Breakthrough Pain Relief  Allergies: 1)  ! Steroids 2)  ! Ambien (Zolpidem Tartrate) 3)  ! Sulfa 4)  ! Neurontin (Gabapentin) 5)  ! Amoxicillin 6)  ! Ultram  Past History:  Past Medical History: Last updated: 07/16/2009 Anxiety Depression Hypertension Low back pain  Past Surgical History: Last updated: 07/16/2009 Lumbar laminectomy  Social History: Last updated: 10/07/2009 Former hairdresser, currently unemployed. Live at home with husband and 3 dogs Smokes 1 ppd Does not drink alcohol Denies any other drug abuse  Risk Factors: Exercise: yes (02/23/2010)  Risk Factors: Smoking Status: current (03/16/2010) Packs/Day: 1.0 (03/16/2010)  Social  History: Reviewed history from 10/07/2009 and no changes required. Former Interior and spatial designer, currently unemployed. Live at home with husband and 3 dogs Smokes 1 ppd Does not drink alcohol Denies any other drug abuse  Review of Systems      See HPI  Physical Exam  Additional Exam:  Gen: AOx3, in no acute distress Eyes: PERRL, EOMI ENT:MMM, No erythema noted in posterior pharynx Neck: No JVD, No LAP Chest: CTAB with  good respiratory effort CVS: regular rhythmic rate, NO M/R/G, S1 S2 normal Abdo: soft,ND, BS+x4, Non tender and No hepatosplenomegaly EXT: No odema noted Neuro: Gait is normal MSK: Pain on bending forward or backward. Skin: no rashes noted.    Impression & Recommendations:  Problem # 1:  LOW BACK PAIN (ICD-724.2) Assessment Unchanged Given her chronic back pain >6 months and MRI c/w facet hypertrophy and disk buldging, I am inclined towarsd starting her on Narcotic meds. We have tried conservative management in last 1 year which hasnt helped and patient is still taking morphine 30mg  2-3 tabs from her friend.  The plan from here on will be: Follow up with Dr Doroteo Bradford for medial nerve blocks. Pain contract and UDS today for 90 tabs of MS IR 30mg . NO benzodiazepenes will be prescribed and she will stop taking them. Work on weight loss. Smoking cessation counselling. Follow up in a month.  There is a concern that she is using her husband's pain med supply and I will review that with him when I see him next.  The following medications were removed from the medication list:    Cyclobenzaprine Hcl 5 Mg Tabs (Cyclobenzaprine hcl) ..... .1-2 tabs by mouth every 8-12 hours as needed for back pain. Her updated medication list for this problem includes:    Morphine Sulfate Cr 30 Mg Xr12h-tab (Morphine sulfate) .Marland Kitchen... Take 1 tablet by mouth two times a day    Morphine Sulfate 30 Mg Tabs (Morphine sulfate) .Marland Kitchen... Take 1/2 tab every 6 hours for breakthrough pain  relief  Orders: T-Drug Screen-Urine, (single) [phone])  Problem # 2:  TOBACCO USER (ICD-305.1) Assessment: Unchanged Patient was counseled on smoking cessation strategies including medications and behavior modification options.  Orders: Social Work Referral (Social )  Problem # 3:  ANXIETY (ICD-300.00) Assessment: Improved Recently started on paxil 40 mg a day. Plan to avoid benzos completely. Go up on SSRI's dose as needed.  The following medications were removed from the medication list:    Klonopin 0.5 Mg Tabs (Clonazepam) .Marland Kitchen... Take 1 tablet by mouth two times a day as needed for anxiety. Her updated medication list for this problem includes:    Paroxetine Hcl 20 Mg Tabs (Paroxetine hcl) .Marland Kitchen... Take 2  tablet by mouth once a day at bedtime  Discussed medication use and relaxation techniques.   Problem # 4:  INADEQUATE MATERIAL RESOURCES (ICD-V60.2) Assessment:  Comment Only Patient talks to Lupita Leash regularly and will discuss about it as and when required.  Complete Medication List: 1)  Lisinopril 20 Mg Tabs (Lisinopril) .... Take 1 tablet by mouth once a day 2)  Paroxetine Hcl 20 Mg Tabs (Paroxetine hcl) .... Take 2  tablet by mouth once a day at bedtime 3)  Morphine Sulfate Cr 30 Mg Xr12h-tab (Morphine sulfate) .... Take 1 tablet by mouth two times a day 4)  Morphine Sulfate 30 Mg Tabs (Morphine sulfate) .... Take 1/2 tab every 6 hours for breakthrough pain relief  Patient Instructions: 1)  Please schedule a follow-up appointment in 1 month. 2)  Tobacco is very bad for your health and your loved ones! You Should stop smoking!. 3)  Stop Smoking Tips: Choose a Quit date. Cut down before the Quit date. decide what you will do as a substitute when you feel the urge to smoke(gum,toothpick,exercise). Prescriptions: LISINOPRIL 20 MG TABS (LISINOPRIL) Take 1 tablet by mouth once a day  #30 x 12   Entered and Authorized by:   Lars Mage MD   Signed by:   Lars Mage MD on  03/16/2010   Method used:   Print then Give to Patient   RxID:   [id number] MORPHINE SULFATE 30 MG TABS (MORPHINE SULFATE) take 1/2 tab every 6 hours for breakthrough pain relief  #15 x 0   Entered and Authorized by:   Lars Mage MD   Signed by:   Lars Mage MD on 03/16/2010   Method used:   Print then Give to Patient   RxID:   [id number] MORPHINE SULFATE CR 30 MG XR12H-TAB (MORPHINE SULFATE) Take 1 tablet by mouth two times a day  #60 x 0   Entered and Authorized by:   Lars Mage MD   Signed by:   Lars Mage MD on 03/16/2010   Method used:   Print then Give to Patient   RxID:   [id number] PAROXETINE HCL 20 MG TABS (PAROXETINE HCL) Take 2  tablet by mouth once a day at bedtime  #60 x 12   Entered and Authorized by:   Lars Mage MD   Signed by:   Lars Mage MD on 03/16/2010   Method used:   Electronically to        [facility]  [phone]* (retail)       [address]       [place], [place]  [address]       Ph: [phone] or [phone]       Fax: [phone]   RxID:   [id number]    Orders Added: 1)  Social Work Referral Lelon Mast ] 2)  Est. Patient Level IV [phone] 3)  T-Drug Screen-Urine, (single) [[id number]]   Process Orders Check Orders Results:     [facility]: ABN not required for this insurance Order queued for requisitioning for [facility]: March 16, 2010 3:54 PM Tests Sent for requisitioning (March 16, 2010 4:12 PM):     03/16/2010: [facility] -- T-Drug Screen-Urine, (single) [phone] (signed)     Prevention & Chronic Care Immunizations   Influenza vaccine: Fluvax 3+  (02/09/2010)   Influenza vaccine deferral: Deferred  (11/06/2009)    Tetanus booster: 11/06/2009: Tdap   Td booster deferral: Deferred  (07/16/2009)    Pneumococcal vaccine: Not documented  Colorectal Screening   Hemoccult: Not documented   Hemoccult action/deferral: Deferred   (02/09/2010)    Colonoscopy: Not documented  Colonoscopy action/deferral: Deferred  (02/09/2010)  Other Screening   Pap smear: Not documented   Pap smear action/deferral: Deferred  (02/09/2010)    Mammogram: Not documented   Mammogram action/deferral: Deferred  (02/09/2010)   Smoking status: current  (03/16/2010)   Smoking cessation counseling: yes  (03/16/2010)  Lipids   Total Cholesterol: 179  (02/23/2010)   Lipid panel action/deferral: Deferred   LDL: 117  (02/23/2010)   LDL Direct: Not documented   HDL: 38  (02/23/2010)   Triglycerides: 119  (02/23/2010)  Hypertension   Last Blood Pressure: 132 / 88  (03/16/2010)   Serum creatinine: 0.98  (02/23/2010)   Serum potassium 4.1  (02/23/2010)  Self-Management Support :   Personal Goals (by the next clinic visit) :      Personal blood pressure goal: 140/90  (07/16/2009)   Patient will work on the following items until the next clinic visit to reach self-care goals:     Medications and monitoring: take my medicines every day  (03/16/2010)     Eating: eat foods that are low in salt, eat baked foods instead of fried foods  (03/16/2010)     Activity: take a 30 minute walk every day  (02/23/2010)    Hypertension self-management support: Written self-care plan  (03/16/2010)   Hypertension self-care plan printed.   Process Orders Check Orders Results:     [facility]: ABN not required for this insurance Order queued for requisitioning for [facility]: March 16, 2010 3:54 PM Tests Sent for requisitioning (March 16, 2010 4:12 PM):     03/16/2010: [facility] -- T-Drug Screen-Urine, (single) [[id number]] (signed)

## 2010-05-26 NOTE — Progress Notes (Signed)
Summary: [facility]AT/ hla  Phone Note Call from Patient   Summary of Call: pt calls to say she brought [facility] application in to be completed by physician and it was faxed to [facility] with nothing but what she, the pt had filled out, she states this needs to be done ASAP or she will have to wait for services for several months. i called pt back, obtained [facility] worker's name and ph# and will call her fri 7/22. hilda  [phone], [phone] in at 0900 Initial call taken by: Marin Dutch RN,  November 13, 2009 5:11 PM  Follow-up for Phone Call        Letter completed and given to Lela along with the [facility] form. Follow-up by: Lars Mage MD,  November 14, 2009 3:47 PM

## 2010-05-26 NOTE — Letter (Signed)
Summary: Smoking Cessation  [facility]  [address]   [place], [place] [address]   Phone: [phone]  Fax: [phone]    03/23/2010     Marie Willis [address] [place], [place]  [address]  Dear Ms. Cislo,  Your doctor would like for you to participate in a session of smoking cessation counseling.   I tried reaching you by phone but was unable to make contact with you.  Please call me at your earliest convenience at [phone] so we can set up an appointment to discuss a plan and resources for quitting smoking.   Sincerely,    Dorothe Pea [facility] Smoking Cessation Leader

## 2010-05-26 NOTE — Letter (Signed)
Summary: [facility]  [facility]   Imported By: Shon Hough 11/28/2009 14:05:51  _____________________________________________________________________  External Attachment:    Type:   Image     Comment:   External Document

## 2010-05-26 NOTE — Progress Notes (Signed)
  Phone Note Outgoing Call   Call placed by: Theotis Barrio NT II,  October 22, 2009 4:36 PM Call placed to: Patient Details for Reason: TO HAVE LAB WORK Summary of Call: CALLED AND LEFT VOICE MESSAGE FOR PATIENT TO CALL SO WE CAN MAKE APPT FOR HER TO COME IN FOR LAB TO BE DRAWN. TO CALL [facility] AT [phone]. Guillermo Nehring NT II  October 22, 2009 4:40 PM

## 2010-05-26 NOTE — Progress Notes (Signed)
Summary: Reffill/gh  Phone Note Refill Request Message from:  Fax from Pharmacy on January 08, 2010 2:07 PM  Refills Requested: Medication #1:  CYCLOBENZAPRINE HCL 5 MG TABS .1-2 tabs by mouth every 8-12 hours as needed for back pain..   Dosage confirmed as above?Dosage Confirmed   Brand Name Necessary? No   Supply Requested: 3 months   Last Refilled: 12/06/2009  Method Requested: Electronic Initial call taken by: Angelina Ok RN,  January 08, 2010 2:07 PM  Follow-up for Phone Call        Refill approved-nurse to complete Follow-up by: Lars Mage MD,  January 10, 2010 11:32 AM    Prescriptions: CYCLOBENZAPRINE HCL 5 MG TABS (CYCLOBENZAPRINE HCL) .1-2 tabs by mouth every 8-12 hours as needed for back pain.  #60 x 1   Entered and Authorized by:   Lars Mage MD   Signed by:   Lars Mage MD on 01/10/2010   Method used:   Electronically to        [facility]  [phone]* (retail)       [address]       [place], [place]  [address]       Ph: [phone] or [phone]       Fax: [phone]   RxID:   [id number]

## 2010-05-26 NOTE — Progress Notes (Signed)
Summary: med refill/gp  Phone Note Refill Request Message from:  Patient on January 29, 2010 9:51 AM  Refills Requested: Medication #1:  CYCLOBENZAPRINE HCL 5 MG TABS .1-2 tabs by mouth every 8-12 hours as needed for back pain..   Dosage confirmed as above?Dosage Confirmed   Brand Name Necessary? No   Supply Requested: 1 month   Last Refilled: 01/10/2010 Pt. has an appt. Oct. 17 with you.   Method Requested: Electronic Initial call taken by: Chinita Pester RN,  January 29, 2010 9:52 AM  Follow-up for Phone Call        Refill approved-nurse to complete Follow-up by: Lars Mage MD,  January 30, 2010 12:29 PM  Additional Follow-up for Phone Call Additional follow up Details #1::        Pt. was called about Rx refil. Additional Follow-up by: Chinita Pester RN,  January 30, 2010 4:39 PM    Prescriptions: CYCLOBENZAPRINE HCL 5 MG TABS (CYCLOBENZAPRINE HCL) .1-2 tabs by mouth every 8-12 hours as needed for back pain.  #60 x 1   Entered and Authorized by:   Lars Mage MD   Signed by:   Lars Mage MD on 01/30/2010   Method used:   Electronically to        [facility]  [phone]* (retail)       [address]       [place], [place]  [address]       Ph: [phone] or [phone]       Fax: [phone]   RxID:   [id number]

## 2010-05-26 NOTE — Progress Notes (Signed)
  Phone Note Outgoing Call   Call placed by: Theotis Barrio NT II,  October 28, 2009 11:19 AM Call placed to: Patient Details for Reason: NEEDS APPT TO COME INL FOR LAB Summary of Call: CALLED PATIENT AND LEFT VOICE MESSAGE FOR HER TO CALL AND MAKE APPT FOR BLOOD WORK. NEEDS TO CALL [phone]. Lela Sturdivant NT II  October 28, 2009 11:20 AM

## 2010-05-26 NOTE — Progress Notes (Signed)
Summary: wak-in/gp  Phone Note Call from Patient   Action Taken: Patient advised to go to ER Summary of Call: Walk-in;here to see [facility]. c/o heart palpitations; pt. states hx. related to stress. Also states unable to "get a full   breath".  States no c/o chest pain. States palpitations started last night w/ sweats,but states hx  of night sweats. Took an ASA this morning. O2 sat 100. HR- 67. BP - 114/80. States it does not feel like reflux. I will tak to the Attending. Initial call taken by: Chinita Pester RN,  February 23, 2010 3:00 PM  Follow-up for Phone Call        Can be worked in to schedule today in Dr.Magick's or Dr. Rosemarie Ax open slots. Follow-up by: Mariea Stable MD,  February 23, 2010 3:18 PM

## 2010-05-26 NOTE — Letter (Signed)
Summary: PATIENT RX HISTORY REPORT  PATIENT RX HISTORY REPORT   Imported By: Margie Billet 07/17/2009 10:57:42  _____________________________________________________________________  External Attachment:    Type:   Image     Comment:   External Document

## 2010-05-26 NOTE — Progress Notes (Signed)
Summary: med refill/gp  Phone Note Refill Request Message from:  Fax from Pharmacy on November 27, 2009 11:23 AM  Refills Requested: Medication #1:  CYCLOBENZAPRINE HCL 5 MG TABS .1-2 tabs by mouth every 8-12 hours as needed for back pain..   Dosage confirmed as above?Dosage Confirmed   Brand Name Necessary? No   Supply Requested: 1 month   Last Refilled: 11/07/2009 Last appt. July 14.   Method Requested: Electronic Initial call taken by: Chinita Pester RN,  November 27, 2009 11:23 AM  Follow-up for Phone Call        Refill approved-nurse to complete Follow-up by: Lars Mage MD,  November 27, 2009 11:40 AM    Prescriptions: CYCLOBENZAPRINE HCL 5 MG TABS (CYCLOBENZAPRINE HCL) .1-2 tabs by mouth every 8-12 hours as needed for back pain.  #60 x 1   Entered and Authorized by:   Lars Mage MD   Signed by:   Lars Mage MD on 11/27/2009   Method used:   Electronically to        [facility]  [phone]* (retail)       [address]       [place], [place]  [address]       Ph: [phone] or [phone]       Fax: [phone]   RxID:   [id number]

## 2010-05-26 NOTE — Progress Notes (Signed)
Summary: ultram/ tramadol/ hla  Phone Note Call from Patient   Summary of Call: pt calls, states she and spouse realized that she is allergic to tramadol and ultram, could you please advise. in a side note pt was called and informed that we spoke and you would address 3/24, she said you rushed her so that she didn't feel she was adequately listened to. Initial call taken by: Marin Lacorte RN,  July 16, 2009 5:13 PM  Follow-up for Phone Call        Patient had various problems to discuss during the first visit including back pain, neck pain, anxiety attacks, insomnia, preventive care, headaches and did not bring her old records with her. Over 45 minutes in personal interview and 1.5 hours in total were spent with the patient. Me and Dr Sampson Goon realized during the encounter that she will be looking for chronic pain meds and were not comfortable starting narcotics at this visit. I will discuss about other measures of pain control during todays visit. Follow-up by: Lars Mage MD,  July 17, 2009 8:30 AM

## 2010-05-26 NOTE — Progress Notes (Signed)
  Phone Note Call from Patient   Caller: Patient Complaint: Breathing Problems Summary of Call: Call from pt requesting  Dr Eben Burow change her antibiotic to Erythromycin 250mg  tabs intead of the clindamycin 300mg  tabs he wrote for at last visit which costs a lot more.  Patient at pharmacy now,  I will speak to Dr Eben Burow and contact pt back.Marie Willis)  July 17, 2009 3:05 PM   Follow-up for Phone Call        D/W Dr Eben Burow, rx changed to Erythromycin 250mg  one tab four times a day for 10 days # 40 with no refills..Rx called into [facility] on [address]  Follow-up by: Marie Willis),  July 17, 2009 3:36 PM  Additional Follow-up for Phone Call Additional follow up Details #1::        Patient was prescribed Clindamycin but she could not afford it as it $80 at [facility] that [facility]. Marie Willis called up [facility] and reconfirmed that the clinda was 15 dollars which was initial reason for prescription. The patient was called back with information but did not have any vehicle to reach [facility]. She insisted on getting the Erythromycin prescription that she was prescribed the last time when she was in ED. I will change the med in the med list. Additional Follow-up by: Marie Willis,  July 17, 2009 3:49 PM    New/Updated Medications: ERYTHROMYCIN BASE 250 MG TABS (ERYTHROMYCIN BASE) Take 1 tablet by mouth four times a day Prescriptions: ERYTHROMYCIN BASE 250 MG TABS (ERYTHROMYCIN BASE) Take 1 tablet by mouth four times a day  #40 x 0   Entered and Authorized by:   Marie Willis   Signed by:   Marie Willis on 07/17/2009   Method used:   Telephoned to ...         RxID:   [id number]   Appended Document: Erythromycin rx//kg    Phone Note Call from Patient   Summary of Call: Received message on recorder from patient, rx for emycin not correct.  Attempted to contact pt back, message left on recorder for pt to contact clinic tomorrow regarding erythromycin rx.     Initial call  taken by: Marie Willis),  July 17, 2009 5:26 PM   New Allergies: ! ULTRAM New Allergies: ! Marie Willis

## 2010-05-26 NOTE — Assessment & Plan Note (Signed)
Summary: SHORTNESS BREATH/ PER DR. ALVAREZ/ SB   Vital Signs:  Patient profile:   55 year old female Height:      66 inches Weight:      204.5 pounds BMI:     33.13 O2 Sat:      100 % on Room air Pulse rate:   67 / minute BP sitting:   114 / 80  (right arm) Cuff size:   large  O2 Flow:  Room air CC: Depression and palpitation Is Patient Diabetic? No Pain Assessment Patient in pain? yes     Location: back Intensity: 7-8 Type: sharp Onset of pain  years Nutritional Status BMI of > 30 = obese Nutritional Status Detail appetite good  Have you ever been in a relationship where you felt threatened, hurt or afraid?husband   Does patient need assistance? Functional Status Self care Ambulation Impaired:Risk for fall Comments Uses a cane. Under inc stress with home situation and money for past 2-4 years.   Primary Care Provider:  Lars Mage MD  CC:  Depression and palpitation.  History of Present Illness: Marie Willis is here today for an acute visit due to palpitations and stres.   pt has had palpitation since she was young, however yesterday as she was walking her dog, she felt paliptations that lasted for 2 hours, pt was on xanex in the past for her anxiety. and her feeling of anxiety/palpitation has been worked up in the past. last night she took diazepam  also complains of flushing. 2/2 menopause.   pt is now under more stress, related to job, money, husband etc..   Still smoking 1 ppd.  Depression History:      The patient denies a depressed mood most of the day and a diminished interest in her usual daily activities.         Preventive Screening-Counseling & Management  Alcohol-Tobacco     Smoking Status: current     Smoking Cessation Counseling: yes     Packs/Day: 1.0  Caffeine-Diet-Exercise     Does Patient Exercise: yes     Type of exercise: WALKING DOGS 3  Current Medications (verified): 1)  Lisinopril 20 Mg Tabs (Lisinopril) .... Take 1 Tablet By  Mouth Once A Day 2)  Cyclobenzaprine Hcl 5 Mg Tabs (Cyclobenzaprine Hcl) .... .1-2 Tabs By Mouth Every 8-12 Hours As Needed For Back Pain. 3)  Klonopin 0.5 Mg Tabs (Clonazepam) .... Take 1 Tablet By Mouth Two Times A Day As Needed For Anxiety. 4)  Paroxetine Hcl 40 Mg Tabs (Paroxetine Hcl) .... Take 1 Tablet By Mouth Once A Day  Allergies: 1)  ! Steroids 2)  ! Ambien (Zolpidem Tartrate) 3)  ! Sulfa 4)  ! Neurontin (Gabapentin) 5)  ! Amoxicillin 6)  ! Ultram  Social History: Packs/Day:  1.0  Review of Systems       negative except per HPI  Physical Exam  General:  alert, well-developed, well-nourished, and well-hydrated.   Lungs:  normal respiratory effort, normal breath sounds, no crackles, and no wheezes.   Heart:  normal rate and regular rhythm.   Abdomen:  soft, non-tender, no guarding, and no rigidity.   Neurologic:  alert & oriented X3.   Psych:  moderately anxious.  moderately anxious.     Impression & Recommendations:  Problem # 1:  ANXIETY (ICD-300.00) Assessment Comment Only will increase paroxetine to 40mg  daily. and give klonopin as needed until SSRI starts working.  This is very unlikely to be cardiac in  origin, however will check EKG and other routine labs.   Her updated medication list for this problem includes:    Klonopin 0.5 Mg Tabs (Clonazepam) .Marland Kitchen... Take 1 tablet by mouth two times a day as needed for anxiety.    Paroxetine Hcl 40 Mg Tabs (Paroxetine hcl) .Marland Kitchen... Take 1 tablet by mouth once a day  Orders: T-CBC No Diff ([id number]) 12 Lead EKG (12 Lead EKG)  Discussed medication use and relaxation techniques.   Problem # 2:  HYPERTENSION (ICD-401.9) Assessment: Comment Only Well controlled on current treatment, No new changes made today, Will continue to monitor.   Her updated medication list for this problem includes:    Lisinopril 20 Mg Tabs (Lisinopril) .Marland Kitchen... Take 1 tablet by mouth once a day  Orders: T-Comprehensive Metabolic Panel  ([id number]) T-TSH [phone]) T-Lipid Profile ([id number])  BP today: 114/80 Prior BP: 142/86 (02/09/2010)  Labs Reviewed: K+: 4.2 (07/16/2009) Creat: : 1.42 (07/16/2009)     Problem # 3:  TOBACCO USER (ICD-305.1) Assessment: Comment Only Patient was counseled on smoking cessation strategies including medications and behavior modification options. Patient said she was not ready to stop smoking at this time.    Problem # 4:  CHRONIC KIDNEY DISEASE UNSPECIFIED (ICD-585.9) Assessment: Comment Only will check CMET to assess renal function.   Orders: T-Comprehensive Metabolic Panel ([id number])  Complete Medication List: 1)  Lisinopril 20 Mg Tabs (Lisinopril) .... Take 1 tablet by mouth once a day 2)  Cyclobenzaprine Hcl 5 Mg Tabs (Cyclobenzaprine hcl) .... .1-2 tabs by mouth every 8-12 hours as needed for back pain. 3)  Klonopin 0.5 Mg Tabs (Clonazepam) .... Take 1 tablet by mouth two times a day as needed for anxiety. 4)  Paroxetine Hcl 40 Mg Tabs (Paroxetine hcl) .... Take 1 tablet by mouth once a day  Patient Instructions: 1)  Please schedule a follow-up appointment as needed. Prescriptions: PAROXETINE HCL 40 MG TABS (PAROXETINE HCL) Take 1 tablet by mouth once a day  #30 x 3   Entered and Authorized by:   Darnelle Maffucci MD   Signed by:   Darnelle Maffucci MD on 02/23/2010   Method used:   Print then Give to Patient   RxID:   [id number] KLONOPIN 0.5 MG TABS (CLONAZEPAM) Take 1 tablet by mouth two times a day as needed for anxiety.  #30 x 0   Entered and Authorized by:   Darnelle Maffucci MD   Signed by:   Darnelle Maffucci MD on 02/23/2010   Method used:   Print then Give to Patient   RxID:   [id number]    Orders Added: 1)  T-CBC No Diff [[id number]] 2)  T-Comprehensive Metabolic Panel [[id number]] 3)  T-TSH [[id number]] 4)  T-Lipid Profile [[id number]] 5)  Est. Patient Level IV [57322] 6)  12 Lead EKG [12 Lead EKG]    Prevention & Chronic  Care Immunizations   Influenza vaccine: Fluvax 3+  (02/09/2010)   Influenza vaccine deferral: Deferred  (11/06/2009)    Tetanus booster: 11/06/2009: Tdap   Td booster deferral: Deferred  (07/16/2009)    Pneumococcal vaccine: Not documented  Colorectal Screening   Hemoccult: Not documented   Hemoccult action/deferral: Deferred  (02/09/2010)    Colonoscopy: Not documented   Colonoscopy action/deferral: Deferred  (02/09/2010)  Other Screening   Pap smear: Not documented   Pap smear action/deferral: Deferred  (02/09/2010)    Mammogram: Not documented   Mammogram action/deferral: Deferred  (02/09/2010)   Smoking status: current  (02/23/2010)   Smoking cessation counseling:  yes  (02/23/2010)  Lipids   Total Cholesterol: Not documented   Lipid panel action/deferral: Deferred   LDL: Not documented   LDL Direct: Not documented   HDL: Not documented   Triglycerides: Not documented  Hypertension   Last Blood Pressure: 114 / 80  (02/23/2010)   Serum creatinine: 1.42  (07/16/2009)   Serum potassium 4.2  (07/16/2009) CMP ordered   Self-Management Support :   Personal Goals (by the next clinic visit) :      Personal blood pressure goal: 140/90  (07/16/2009)   Patient will work on the following items until the next clinic visit to reach self-care goals:     Medications and monitoring: take my medicines every day, check my blood pressure  (02/23/2010)     Eating: eat more vegetables, use fresh or frozen vegetables, eat foods that are low in salt, eat fruit for snacks and desserts, limit or avoid alcohol  (02/23/2010)     Activity: take a 30 minute walk every day  (02/23/2010)    Hypertension self-management support: Written self-care plan, Education handout, Resources for patients handout  (02/23/2010)   Hypertension self-care plan printed.   Hypertension education handout printed      Resource handout printed.  Process Orders Check Orders Results:     [facility]: ABN not required for this insurance Tests Sent for requisitioning (February 23, 2010 4:48 PM):     02/23/2010: [facility] -- T-CBC No Diff [[id number]] (signed)     02/23/2010: [facility] -- T-Comprehensive Metabolic Panel [[id number]] (signed)     02/23/2010: [facility] -- T-TSH [phone] (signed)     02/23/2010: [facility] -- T-Lipid Profile [phone] (signed)

## 2010-05-26 NOTE — Progress Notes (Signed)
  Phone Note Outgoing Call   Call placed by: Theotis Barrio NT II,  November 21, 2009 1:57 PM Call placed to: Specialist Details for Reason: [facility] Summary of Call: CALLED TO FOLLOW UP ON RECORDS THAT I REQUESTED / DATE FAXED 7/15/011 WITH CONFIRMATION OF 16:02. LEFT MESSAGE FOR OFFICEL TO GIVE ME A CALL, THE OFFICE CLOSE EARLY ON FRIDAY.

## 2010-05-26 NOTE — Letter (Signed)
Summary: Generic Letter  [facility]  [address]   [place], [place] [address]   Phone: [phone]  Fax: [phone]    11/14/2009  ELESA GARMAN [address] [place], [place]  [address]  To whom it may concern,  Ms Regla Fitzgibbon 10/17/1955 is a patient in our clinic and was last seen 2 weeks ago.  She has these problems:  Anxiety Depression Hypertension Low back pain status post hemilaminectomy in the past. migraine chronic pain sec. 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   Sincerely,   Lars Mage MD

## 2010-05-26 NOTE — Progress Notes (Signed)
  Phone Note Outgoing Call   Summary of Call: Left message.   Follow-up for Phone Call        LEft another message for Ms. Marie Willis.  Dorothe Pea  March 09, 2010 10:50 AM

## 2010-05-26 NOTE — Progress Notes (Signed)
Summary: refill/gg  Phone Note Refill Request  on March 06, 2010 4:50 PM  Refills Requested: Medication #1:  PAROXETINE HCL 40 MG TABS Take 1 tablet by mouth once a day.   Dosage confirmed as above?Dosage Confirmed   Brand Name Necessary? No   Supply Requested: 1 year Pt states 40 mg paxil is not on the $4 list at [facility].  She would like to change to paxil 20mg  take 2 a day, so it's covered on the $4 list.   Method Requested: Telephone to Pharmacy Initial call taken by: Merrie Roof RN,  March 06, 2010 4:51 PM    New/Updated Medications: PAROXETINE HCL 20 MG TABS (PAROXETINE HCL) Take 2  tablet by mouth once a day Prescriptions: PAROXETINE HCL 20 MG TABS (PAROXETINE HCL) Take 2  tablet by mouth once a day  #60 x 12   Entered and Authorized by:   Lars Mage MD   Signed by:   Lars Mage MD on 03/09/2010   Method used:   Electronically to        [facility]  [phone]* (retail)       [address]       [place], [place]  [address]       Ph: [phone] or [phone]       Fax: [phone]   RxID:   [id number] PAROXETINE HCL 40 MG TABS (PAROXETINE HCL) Take 1 tablet by mouth once a day  #30 x 12   Entered and Authorized by:   Lars Mage MD   Signed by:   Lars Mage MD on 03/09/2010   Method used:   Electronically to        [facility]  [phone]* (retail)       [address]       [place], [place]  [address]       Ph: [phone] or [phone]       Fax: [phone]   RxID:   [id number]

## 2010-05-26 NOTE — Progress Notes (Signed)
  Phone Note Other Incoming   Caller: [employer] forces logistics/support/administrative officer / [employer]    [phone] Summary of Call: OFFICE RETURNED MY CALL, THE STORAGE IS UNABL TO LOCATE THE CHART.Marland Kitchen  FLAGGED DR Eben Burow CONCERNING THIS .

## 2010-05-26 NOTE — Assessment & Plan Note (Signed)
Summary: EST-CK/FU/MEDS/CFB   Vital Signs:  Patient profile:   55 year old female Height:      66 inches (167.64 cm) Weight:      209.9 pounds (95.41 kg) BMI:     34.00 Temp:     99.6 degrees F oral Pulse rate:   83 / minute BP sitting:   142 / 86  (right arm) Cuff size:   large  Vitals Entered By: [name] Duncan Dull) (February 09, 2010 2:13 PM) CC: med refill, bilateral knee pain, back pain, bronchitis Is Patient Diabetic? No Pain Assessment Patient in pain? no      Nutritional Status BMI of > 30 = obese  Have you ever been in a relationship where you felt threatened, hurt or afraid?No   Does patient need assistance? Functional Status Self care Ambulation Impaired:Risk for fall Comments cane   Primary Care Provider:  Lars Mage MD  CC:  med refill, bilateral knee pain, back pain, and bronchitis.  History of Present Illness: Marie Willis is here today for a regular follow up and flu shot.  Patient has been having cough since last few weeks but its resolved now. No fever, increased shortness of breath or change in colour of sputum.  She has pain in her lower back, upper back and left elbow. The back pain is similar to what she always has, flexeril does help a little. No tingling, numbness, urinary incontinence. The upper back pain is new and is associated with pain while movement of her arms. The elbow pain is on the left side and she says that it started suddenly 7-8/10, non radiating, persistent,  she does not remember any injury. No swelling or bruising noted. Increased with movement and decreased with pain meds.  She feels depressed and wants something for her anxiety. She says that she used to take xanax for it. She is tearful at times while talking to me.  Still smoking 1 ppd.  Depression History:      The patient denies a depressed mood most of the day and a diminished interest in her usual daily activities.        The patient denies that she has thought  about ending her life.        Comments:  stays anxious, depressed at times.   Preventive Screening-Counseling & Management  Alcohol-Tobacco     Smoking Status: current     Smoking Cessation Counseling: yes     Packs/Day: 0.75  Problems Prior to Update: 1)  Insomnia Unspecified  (ICD-780.52) 2)  Chronic Kidney Disease Unspecified  (ICD-585.9) 3)  Low Back Pain  (ICD-724.2) 4)  Hypertension  (ICD-401.9) 5)  Depression  (ICD-311) 6)  Anxiety  (ICD-300.00) 7)  Abscess, Tooth  (ICD-522.5) 8)  Inadequate Material Resources  (ICD-V60.2) 9)  Neck Pain, Chronic  (ICD-723.1) 10)  Depression, Hx of  (ICD-V11.8) 11)  Back Pain, Chronic  (ICD-724.5)  Medications Prior to Update: 1)  Lisinopril 20 Mg Tabs (Lisinopril) .... Take 1 Tablet By Mouth Once A Day 2)  Seroquel 100 Mg Tabs (Quetiapine Fumarate) .... Take 1 Tab By Mouth Once Daily 3)  Cyclobenzaprine Hcl 5 Mg Tabs (Cyclobenzaprine Hcl) .... .1-2 Tabs By Mouth Every 8-12 Hours As Needed For Back Pain.  Current Medications (verified): 1)  Lisinopril 20 Mg Tabs (Lisinopril) .... Take 1 Tablet By Mouth Once A Day 2)  Cyclobenzaprine Hcl 5 Mg Tabs (Cyclobenzaprine Hcl) .... .1-2 Tabs By Mouth Every 8-12 Hours As Needed For Back Pain. 3)  Diazepam 2 Mg Tabs (Diazepam) .... Take 1 Tab Once A Day At Bedtime For Muscle Relaxation and Anxiety For Next 15 Days As Needed 4)  Paroxetine Hcl 20 Mg Tabs (Paroxetine Hcl) .... Take 1 Tablet By Mouth Once A Day At Night  Allergies: 1)  ! Steroids 2)  ! Ambien (Zolpidem Tartrate) 3)  ! Sulfa 4)  ! Neurontin (Gabapentin) 5)  ! Amoxicillin 6)  ! Ultram  Past History:  Past Medical History: Last updated: 07/16/2009 Anxiety Depression Hypertension Low back pain  Past Surgical History: Last updated: 07/16/2009 Lumbar laminectomy  Social History: Last updated: 10/07/2009 Former hairdresser, currently unemployed. Live at home with husband and 3 dogs Smokes 1 ppd Does not drink  alcohol Denies any other drug abuse  Risk Factors: Exercise: yes (11/06/2009)  Risk Factors: Smoking Status: current (02/09/2010) Packs/Day: 0.75 (02/09/2010)  Review of Systems      See HPI  Physical Exam  Additional Exam:  Gen: AOx3, in no acute distress, tearful at times Eyes: PERRL, EOMI ENT:MMM, No erythema noted in posterior pharynx Neck: No JVD, No LAP Chest: CTAB with  good respiratory effort CVS: regular rhythmic rate, NO M/R/G, S1 S2 normal Abdo: soft,ND, BS+x4, Non tender and No hepatosplenomegaly EXT: No odema noted Neuro: Non focal, gait is normal Skin: no rashes noted.  Back: no spinal tenderness, paraspinal tenderness on the left upper back and bilateral lower back. Elbow: No external injury noted, no bruising, range of motion full but painful.   Impression & Recommendations:  Problem # 1:  ELBOW PAIN, LEFT (ICD-719.42) Assessment New Will refer to sports medicine for evaluation of left elbow pain, she does use cane with her left hand all the time which might explain elbow pain. She might benefit from an elbow splint. Orders: Sports Medicine (Sports Med)  Problem # 2:  TOBACCO USER (ICD-305.1) Patient was counseled on smoking cessation strategies including medications and behavior modification options.   Orders: Social Work Referral (Social )  Problem # 3:  INSOMNIA UNSPECIFIED (ICD-780.52) Assessment: Deteriorated Will prescribe diazepam and start het on SSRI for anxiety. No xanax as inmcreased potential of abuse. Plan to continue diazepam for 15 days untill ssri takes effect and go up on SSRI dosage.  Problem # 4:  LOW BACK PAIN (ICD-724.2) Continue cyclobenzaprine and start diazepam. No red flags at this time. Her updated medication list for this problem includes:    Cyclobenzaprine Hcl 5 Mg Tabs (Cyclobenzaprine hcl) ..... .1-2 tabs by mouth every 8-12 hours as needed for back pain.  Discussed use of moist heat or ice, modified activities,  medications, and stretching/strengthening exercises. Back care instructions given. To be seen in 2 weeks if no improvement; sooner if worsening of symptoms.   Problem # 5:  HYPERTENSION (ICD-401.9) She does describe pain which is most likely affecting her BP at this time. Will continue to monitor. Her updated medication list for this problem includes:    Lisinopril 20 Mg Tabs (Lisinopril) .Marland Kitchen... Take 1 tablet by mouth once a day  BP today: 142/86 Prior BP: 124/83 (11/06/2009)  Labs Reviewed: K+: 4.2 (07/16/2009) Creat: : 1.42 (07/16/2009)     Problem # 6:  ANXIETY (ICD-300.00) Assessment: Deteriorated  Her updated medication list for this problem includes:    Diazepam 2 Mg Tabs (Diazepam) .Marland Kitchen... Take 1 tab once a day at bedtime for muscle relaxation and anxiety for next 15 days as needed    Paroxetine Hcl 20 Mg Tabs (Paroxetine hcl) .Marland Kitchen... Take 1 tablet by  mouth once a day at night  Discussed medication use and relaxation techniques.   Problem # 7:  DEPRESSION (ICD-311)  Her updated medication list for this problem includes:    Diazepam 2 Mg Tabs (Diazepam) .Marland Kitchen... Take 1 tab once a day at bedtime for muscle relaxation and anxiety for next 15 days as needed    Paroxetine Hcl 20 Mg Tabs (Paroxetine hcl) .Marland Kitchen... Take 1 tablet by mouth once a day at night  Discussed treatment options, including trial of antidpressant medication. Will refer to behavioral health. Follow-up call in in 24-48 hours and recheck in 2 weeks, sooner as needed. Patient agrees to call if any worsening of symptoms or thoughts of doing harm arise. Verified that the patient has no suicidal ideation at this time.   Complete Medication List: 1)  Lisinopril 20 Mg Tabs (Lisinopril) .... Take 1 tablet by mouth once a day 2)  Cyclobenzaprine Hcl 5 Mg Tabs (Cyclobenzaprine hcl) .... .1-2 tabs by mouth every 8-12 hours as needed for back pain. 3)  Diazepam 2 Mg Tabs (Diazepam) .... Take 1 tab once a day at bedtime for muscle  relaxation and anxiety for next 15 days as needed 4)  Paroxetine Hcl 20 Mg Tabs (Paroxetine hcl) .... Take 1 tablet by mouth once a day at night  Other Orders: Admin 1st Vaccine (14782) Flu Vaccine 8yrs + (95621)  Patient Instructions: 1)  Please schedule a follow-up appointment in 1 month. 2)  Tobacco is very bad for your health and your loved ones! You Should stop smoking!. 3)  Stop Smoking Tips: Choose a Quit date. Cut down before the Quit date. decide what you will do as a substitute when you feel the urge to smoke(gum,toothpick,exercise). 4)  It is important that you exercise regularly at least 20 minutes 5 times a week. If you develop chest pain, have severe difficulty breathing, or feel very tired , stop exercising immediately and seek medical attention. 5)  You need to lose weight. Consider a lower calorie diet and regular exercise.  Prescriptions: PAROXETINE HCL 20 MG TABS (PAROXETINE HCL) Take 1 tablet by mouth once a day at night  #30 x 0   Entered and Authorized by:   Lars Mage MD   Signed by:   Lars Mage MD on 02/09/2010   Method used:   Print then Give to Patient   RxID:   [id number] DIAZEPAM 2 MG TABS (DIAZEPAM) take 1 tab once a day at bedtime for muscle relaxation and anxiety for next 15 days as needed  #15 x 0   Entered and Authorized by:   Lars Mage MD   Signed by:   Lars Mage MD on 02/09/2010   Method used:   Print then Give to Patient   RxID:   [id number]    Orders Added: 1)  Social Work Referral Lelon Mast ] 2)  Sports Medicine [Sports Med] 3)  Admin 1st Vaccine [90471] 4)  Flu Vaccine 76yrs + [44034] 5)  Est. Patient Level IV [74259]    Prevention & Chronic Care Immunizations   Influenza vaccine: Fluvax 3+  (02/09/2010)   Influenza vaccine deferral: Deferred  (11/06/2009)    Tetanus booster: 11/06/2009: Tdap   Td booster deferral: Deferred  (07/16/2009)    Pneumococcal vaccine: Not documented  Colorectal Screening   Hemoccult: Not  documented   Hemoccult action/deferral: Deferred  (02/09/2010)    Colonoscopy: Not documented   Colonoscopy action/deferral: Deferred  (02/09/2010)  Other Screening   Pap smear: Not documented  Pap smear action/deferral: Deferred  (02/09/2010)    Mammogram: Not documented   Mammogram action/deferral: Deferred  (02/09/2010)   Smoking status: current  (02/09/2010)   Smoking cessation counseling: yes  (02/09/2010)  Lipids   Total Cholesterol: Not documented   Lipid panel action/deferral: Deferred   LDL: Not documented   LDL Direct: Not documented   HDL: Not documented   Triglycerides: Not documented  Hypertension   Last Blood Pressure: 142 / 86  (02/09/2010)   Serum creatinine: 1.42  (07/16/2009)   Serum potassium 4.2  (07/16/2009)    Hypertension flowsheet reviewed?: Yes   Progress toward BP goal: At goal  Self-Management Support :   Personal Goals (by the next clinic visit) :      Personal blood pressure goal: 140/90  (07/16/2009)   Hypertension self-management support: Written self-care plan  (02/09/2010)   Hypertension self-care plan printed.   Nursing Instructions: Give Flu vaccine today  Flu Vaccine Consent Questions     Do you have a history of severe allergic reactions to this vaccine? no    Any prior history of allergic reactions to egg and/or gelatin? no    Do you have a sensitivity to the preservative Thimersol? no    Do you have a past history of Guillan-Barre Syndrome? no    Do you currently have an acute febrile illness? no    Have you ever had a severe reaction to latex? no    Vaccine information given and explained to patient? yes    Are you currently pregnant? no    Lot Number:[id number]   Exp Date:10/24/2010   Manufacturer: [employer]    Site Given  Left Deltoid IM.[name] [facility])  February 09, 2010 4:45 PM      Personal blood pressure goal: 140/90  (07/16/2009)   Hypertension self-management support: Written self-care plan   (02/09/2010)   Hypertension self-care plan printed.   Nursing Instructions: Give Flu vaccine today    .opcflu

## 2010-05-26 NOTE — Miscellaneous (Signed)
Summary: HIPPA/ RESTRICTION  HIPPA/ RESTRICTION   Imported By: Margie Billet 07/17/2009 10:56:09  _____________________________________________________________________  External Attachment:    Type:   Image     Comment:   External Document

## 2010-05-26 NOTE — Assessment & Plan Note (Signed)
Summary: ACUTE-RIGHT SIDE BACK PAIN/(GARG)/CFB   Vital Signs:  Patient profile:   55 year old female Height:      66 inches Weight:      195.0 pounds BMI:     31.59 Temp:     99.4 degrees F oral Pulse rate:   63 / minute BP sitting:   120 / 80  (right arm)  Vitals Entered By: Filomena Jungling NT II (October 07, 2009 2:32 PM) CC: LOWER BACK AND NECK/ , Depression Is Patient Diabetic? No Pain Assessment Patient in pain? yes     Location: neck and lower back Intensity: 10 Type: aching Onset of pain  worse in the last 10 days Nutritional Status BMI of > 30 = obese  Have you ever been in a relationship where you felt threatened, hurt or afraid?No   Does patient need assistance? Functional Status Self care Ambulation Normal   Primary Care Provider:  Lars Mage MD  CC:  LOWER BACK AND NECK/  and Depression.  History of Present Illness: 55 year old woman with PMH of chronic back and neck pain, history of positive UDS with opioids and benzodiazepines without presciption, and insomnia presents for acute visit with following complaints:  Neck pain: Shooting, stabbing pain in C7 to T2 region. Feels like ice picks on both sides. Has been worsened for the last 10 days to 2 weeks. Pain comes and goes (sharp pain). Pain in C7 has been constant for at least 2 months. Certain movements exacerbate the pain. Flexion of neck and lateral motion worsen the pain. Extension of neck helps the pain some, but does not take it away completely Heat and cold do not help it. Vibration does not help. At least 6/10, at worst 10/10.  Lower back pain s/p lumbar laminectomy: R side near hip - sharp and burning pain. When not sharp and burning, aching, feels like a throbbing tootache like pain. Has had the pain evaluated in the past, in same area again today. Has been ongoing at more severe level of intensity for the 10-14 days. At least 6/10, at worst 10/10  Insomnia: Feels worn out - has been sleeping less and less.  3-4 hours of sleep per night. Insomnia has been ongoing for "quite a while now". Has gotten worse over the past 3-4 months. Has not tried anything. As for medications taken in the past, xanax seemed to help in the past. Had trouble after surgery in the past with sleep. Tried neurontin, topamax and seroquel - had problems with first two, but did well with seroquel.  Depression: Patient has dog with current medical problems and states, tearfully, that this has been causing her a great deal of stress and mental angst. States that her husband isn't empathetic to this issue and it bothers her.  Depression History:      The patient denies a depressed mood most of the day and a diminished interest in her usual daily activities.         Preventive Screening-Counseling & Management  Alcohol-Tobacco     Smoking Status: current     Smoking Cessation Counseling: yes     Packs/Day: 0.75  Current Medications (verified): 1)  Lisinopril 20 Mg Tabs (Lisinopril) .... Take 1 Tablet By Mouth Once A Day  Allergies (verified): 1)  ! Steroids 2)  ! Ambien (Zolpidem Tartrate) 3)  ! Sulfa 4)  ! Neurontin (Gabapentin) 5)  ! Amoxicillin 6)  ! 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 Live at home with husband and 3 dogs Smokes 1 ppd Does not drink alcohol Denies any other drug abuse  Review of Systems      See HPI  Physical Exam  General:  alert, well-developed, well-nourished, and well-hydrated.   Head:  normocephalic and atraumatic.   Eyes:  vision grossly intact.   Ears:  no external deformities.     Nose:  no external deformity, no external erythema, and no nasal discharge.   Neck:  good ROM but states there is pain with flexion and lateral motion. Patient flinches with even the slightest touch over C7 (even without any presure over the joint).   Lungs:  normal respiratory effort, normal breath sounds, no crackles, and no wheezes.   Heart:  normal rate and regular rhythm.   Abdomen:  soft, non-tender, no guarding, and no rigidity.   Msk:  Tenderness over C7 and around T2-3, no overlying warmth, edema, or erythema. Also, pain with palpation and surgical scar in midline over lower lumbar spine. More pain that at midline in the paraspinal area just to the right of the lumbar spine near levels L4-S1.  Neurologic:  alert & oriented X3, cranial nerves II-XII intact, strength normal in all extremities, and sensation intact to light touch.  Uses cane on right side  to walk. Gait appears antalgic. Patellar, biceps and brachioradialis DTRs symmetric. No clonus in feet. Skin:  turgor normal, color normal, and no rashes.   Psych:  Oriented X3, memory intact for recent and remote, normally interactive, and depressed affect with tearful description of recent events relating to her dog, mother death years ago and her somewhat strained relationship with her spouse.     Impression & Recommendations:  Problem # 1:  LOW BACK PAIN (ICD-724.2) Chronic, likely progression of prior disease. Patient states that she has already had surgery in the past. No signs or symptoms of cord compression today. Will obtain films and f/u will be based on those results. May need referral to neurosurgery if findings are significant.  The following medications were removed from the medication list:    Cyclobenzaprine Hcl 5 Mg Tabs (Cyclobenzaprine hcl) ..... Marland Kitchentake 1-2 tabs every 8 hours for muscle relaxation as needed  Orders: T-DG Lumbar Spine Complete (04540)  Problem # 2:  NECK PAIN, CHRONIC (ICD-723.1) Chronic condition as  well, will order imaging and proceed with further workup based on results. No symptoms of neurologic compromise or strength deficits.  The following medications were removed from the medication list:    Cyclobenzaprine Hcl 5 Mg Tabs (Cyclobenzaprine hcl) ..... Marland Kitchentake 1-2 tabs every 8 hours for muscle relaxation as needed  Orders: T-DG Cervical Spine Complete ([id number])  Problem # 3:  INSOMNIA UNSPECIFIED (ICD-780.52) Started on trazodone given complaints. Will reassess at followup appointment. Patient has had this issue for years, may be related to depression as well. Given chronicity, this may be difficult to control. Did not have time to discuss better sleep hygiene. Will discuss at next visit if patient is still suffering from insomnia.  Problem # 4:  HYPERTENSION (ICD-401.9) Blood pressure well controlled today. No changes in her medication necessary.  Her updated medication list for this problem includes:  Lisinopril 20 Mg Tabs (Lisinopril) .Marland Kitchen... Take 1 tablet by mouth once a day  BP today: 120/80 Prior BP: 107/73 (07/16/2009)  Labs Reviewed: K+: 4.2 (07/16/2009) Creat: : 1.42 (07/16/2009)     Problem # 5:  DEPRESSION (ICD-311) Patient has tried several medications in the past. Right now does not want to start any anti-depressant medications, but patient informed that better sleep may help. Depression may be playing some role in the patient's pain symptoms as well. May benefit from referral to [facility] in the future if depression still seems to be affecting the patient.  Her updated medication list for this problem includes:    Trazodone Hcl 150 Mg Tabs (Trazodone hcl) .Marland Kitchen... Take 1 tab by mouth at bedtime as needed for insomnia  Complete Medication List: 1)  Lisinopril 20 Mg Tabs (Lisinopril) .... Take 1 tablet by mouth once a day 2)  Trazodone Hcl 150 Mg Tabs (Trazodone hcl) .... Take 1 tab by mouth at bedtime as needed for insomnia  Other Orders: Mammogram  (Screening) (Mammo) Gynecologic Referral (Gyn)  Patient Instructions: 1)  Please schedule a follow-up appointment in 2-3 weeks. 2)  Tobacco is very bad for your health and your loved ones! You Should stop smoking!. 3)  Stop Smoking Tips: Choose a Quit date. Cut down before the Quit date. decide what you will do as a substitute when you feel the urge to smoke(gum,toothpick,exercise). 4)  Schedule your mammogram. 5)  Check your Blood Pressure regularly. If it is above 160/100 you should make an appointment. 6)  Most patients (90%) with low back pain will improve with time (2-6 weeks). Keep active but avoid activities that are painful. Apply moist heat and/or ice to lower back several times a day. 7)  Take 650-1000mg  of Tylenol every 4-6 hours as needed for relief of pain or comfort of fever AVOID taking more than 4000mg   in a 24 hour period (can cause liver damage in higher doses). Prescriptions: LISINOPRIL 20 MG TABS (LISINOPRIL) Take 1 tablet by mouth once a day  #30 x 1   Entered and Authorized by:   Nilda Riggs MD   Signed by:   Nilda Riggs MD on 10/07/2009   Method used:   Print then Give to Patient   RxID:   [id number] TRAZODONE HCL 150 MG TABS (TRAZODONE HCL) Take 1 tab by mouth at bedtime as needed for insomnia  #30 x 1   Entered and Authorized by:   Nilda Riggs MD   Signed by:   Nilda Riggs MD on 10/07/2009   Method used:   Print then Give to Patient   RxID:   [id number]   Prevention & Chronic Care Immunizations   Influenza vaccine: Not documented   Influenza vaccine deferral: Deferred  (07/16/2009)    Tetanus booster: Not documented   Td booster deferral: Deferred  (07/16/2009)    Pneumococcal vaccine: Not documented  Colorectal Screening   Hemoccult: Not documented   Hemoccult action/deferral: Deferred  (07/16/2009)    Colonoscopy: Not documented   Colonoscopy action/deferral: Refused  (10/07/2009)  Other Screening   Pap smear: Not documented    Pap smear action/deferral: GYN Referral  (10/07/2009)    Mammogram: Not documented   Mammogram action/deferral: Ordered  (10/07/2009)   Smoking status: current  (10/07/2009)   Smoking cessation counseling: yes  (10/07/2009)  Lipids   Total Cholesterol: Not documented   Lipid panel action/deferral: Deferred   LDL: Not documented   LDL Direct: Not documented  HDL: Not documented   Triglycerides: Not documented  Hypertension   Last Blood Pressure: 120 / 80  (10/07/2009)   Serum creatinine: 1.42  (07/16/2009)   Serum potassium 4.2  (07/16/2009)    Hypertension flowsheet reviewed?: Yes   Progress toward BP goal: Deteriorated  Self-Management Support :   Personal Goals (by the next clinic visit) :      Personal blood pressure goal: 140/90  (07/16/2009)   Patient will work on the following items until the next clinic visit to reach self-care goals:     Medications and monitoring: take my medicines every day, check my blood pressure  (10/07/2009)     Eating: eat more vegetables, use fresh or frozen vegetables, eat foods that are low in salt, eat baked foods instead of fried foods, eat fruit for snacks and desserts  (10/07/2009)    Hypertension self-management support: Education handout, Resources for patients handout, Written self-care plan  (10/07/2009)   Hypertension self-care plan printed.   Hypertension education handout printed      Resource handout printed.   Nursing Instructions: Schedule screening mammogram (see order) Gyn referral for screening Pap (see order)

## 2010-05-26 NOTE — Progress Notes (Signed)
  Phone Note Outgoing Call   Call placed by: Theotis Barrio NT II,  December 03, 2009 10:59 AM Call placed to: Specialist Details for Reason: PAIN CLINIC Summary of Call: SPOKE WITH APRIL AT THE [facility] / THE REFERRAL IS IN REVIEW FOR THE INDEGENT PATIENT. OFFICE WILL CONTACT [facility] WITH APPT.      Appended Document:  Received faxed confirmation of appt scheduled fro Nov 1st at 11am

## 2010-05-26 NOTE — Letter (Addendum)
Summary: [facility] RECORDS  [facility]   Imported By: Shon Hough 11/25/2009 14:56:44  _____________________________________________________________________  External Attachments:     1. Type:   Image          Comment:   External Document    2. Type:   Image          Comment:   External Document

## 2010-05-26 NOTE — Medication Information (Signed)
Summary: CONTROLLED MEDICATION TREATMENT   CONTROLLED MEDICATION TREATMENT   Imported By: Margie Billet 03/23/2010 09:20:10  _____________________________________________________________________  External Attachment:    Type:   Image     Comment:   External Document

## 2010-05-26 NOTE — Progress Notes (Signed)
Summary: refill/ hla  Phone Note Refill Request Message from:  Patient on March 12, 2010 2:57 PM  Refills Requested: Medication #1:  KLONOPIN 0.5 MG TABS Take 1 tablet by mouth two times a day as needed for anxiety.   Dosage confirmed as above?Dosage Confirmed   Last Refilled: 10/31 pt states she has been out for several days that she was instructed to take it twice daily every day  Initial call taken by: Marin Yeley RN,  March 12, 2010 2:58 PM  Follow-up for Phone Call        Patient was instruected to take it on as needed basis until the level of SSRI's build up in the system. Refill will be denied at this time.  Thanks Follow-up by: Lars Mage MD,  March 16, 2010 1:28 PM  Additional Follow-up for Phone Call Additional follow up Details #1::        Rx denial called/faxed to pharmacy Additional Follow-up by: Angelina Ok RN,  March 16, 2010 3:59 PM

## 2010-05-28 NOTE — Progress Notes (Signed)
Summary: Refill/gh  Phone Note Refill Request Message from:  Patient on April 13, 2010 11:50 AM  Refills Requested: Medication #1:  MORPHINE SULFATE CR 30 MG XR12H-TAB Take 1 tablet by mouth two times a day Pt has been getting the Morphine Sulfate instant release.  Prefers this because the extended costs to much.  Last refillof the short release was on 03/16/2010 per the pharmacist.   Method Requested: Pick up at Office Initial call taken by: Angelina Ok RN,  April 13, 2010 11:50 AM  Follow-up for Phone Call        Please call patient's pharmacy and find out her refill history for morphine sulfate extended release and morphine sulfate IR; I need to clarify which was prescribed by Dr. Eben Burow. Follow-up by: Margarito Liner MD,  April 14, 2010 9:36 AM  Additional Follow-up for Phone Call Additional follow up Details #1::        Will check database and make a decision. Additional Follow-up by: Julaine Fusi  DO,  April 14, 2010 4:39 PM    Additional Follow-up for Phone Call Additional follow up Details #2::    What quantity and what size tablet of MSIR were dispensed on 11/21 by the pharmacy. Follow-up by: Margarito Liner MD,  April 15, 2010 9:04 AM  Additional Follow-up for Phone Call Additional follow up Details #3:: Details for Additional Follow-up Action Taken: Call to [facility] on [address].  Pt received Morphine Sulfate IR 30.  Pt got # 90.   Direction 1 every 6 to 8 hours for back pain.   Written by Dr. Eben Burow.  Paid cash. Call to pt to have her come in for an appiontment this afternoon for assessment of pain need.  No answer.  Mesg left on pt's answering machine to call as soon as possible for the appointment. Angelina Ok RN  April 15, 2010 2:51 PM  Additional Follow-up by: Angelina Ok RN,  April 15, 2010 9:25 AM

## 2010-05-28 NOTE — Medication Information (Signed)
Summary: MORPHINE  MORPHINE   Imported By: Margie Billet 04/22/2010 12:02:44  _____________________________________________________________________  External Attachment:    Type:   Image     Comment:   External Document

## 2010-05-28 NOTE — Assessment & Plan Note (Signed)
Summary: ACUTE-MEDS/CFB(GARG)JOINES   Allergies: 1)  ! Steroids 2)  ! Ambien (Zolpidem Tartrate) 3)  ! Sulfa 4)  ! Neurontin (Gabapentin) 5)  ! Amoxicillin 6)  ! Ultram   Complete Medication List: 1)  Lisinopril 20 Mg Tabs (Lisinopril) .... Take 1 tablet by mouth once a day 2)  Paroxetine Hcl 20 Mg Tabs (Paroxetine hcl) .... Take 2  tablet by mouth once a day at bedtime 3)  Morphine Sulfate 30 Mg Tabs (Morphine sulfate) .... Take 1 tab by mouth every 6-8 hours as needed for back pain, not to exceed 3 per day.

## 2010-05-28 NOTE — Progress Notes (Signed)
Summary: Refill/gh  Phone Note Refill Request Message from:  Patient on May 13, 2010 12:03 PM  Refills Requested: Medication #1:  MORPHINE SULFATE 30 MG TABS take 1 tab by mouth every 6-8 hours as needed for back pain Pt will be out of med on Friday.  Needs to set up a [employer] ride for pick up.  Last office vist was 04/15/2010.   Method Requested: Pick up at Office Initial call taken by: Angelina Ok RN,  May 13, 2010 12:03 PM    Prescriptions: MORPHINE SULFATE 30 MG TABS (MORPHINE SULFATE) take 1 tab by mouth every 6-8 hours as needed for back pain, not to exceed 3 per day.  #90 x 0   Entered and Authorized by:   Blanch Media MD   Signed by:   Blanch Media MD on 05/13/2010   Method used:   Historical   RxID:   [id number] MORPHINE SULFATE 30 MG TABS (MORPHINE SULFATE) take 1 tab by mouth every 6-8 hours as needed for back pain, not to exceed 3 per day.  #90 x 0   Entered and Authorized by:   Blanch Media MD   Signed by:   Blanch Media MD on 05/13/2010   Method used:   Print then Give to Patient   RxID:   [id number]

## 2010-05-28 NOTE — Medication Information (Signed)
Summary: MORPHINE-SULFATE  MORPHINE-SULFATE   Imported By: Margie Billet 05/19/2010 10:55:43  _____________________________________________________________________  External Attachment:    Type:   Image     Comment:   External Document

## 2010-05-28 NOTE — Assessment & Plan Note (Signed)
Vital Signs:  Patient profile:   55 year old female Height:      66 inches Weight:      207.3 pounds (94.23 kg) BMI:     33.58 Temp:     99.2 degrees F (37.33 degrees C) oral Pulse rate:   77 / minute BP sitting:   140 / 82  (right arm) Cuff size:   RIGHT  Vitals Entered By: Theotis Barrio NT II (April 15, 2010 4:15 PM) CC: PATIENT IS HERE ONLY FOR PAIN MEDICATION ONLY Is Patient Diabetic? No Pain Assessment Patient in pain? no       Have you ever been in a relationship where you felt threatened, hurt or afraid?No   Does patient need assistance? Functional Status Self care Ambulation Normal   Primary Care Marie Willis:  Lars Mage MD  CC:  PATIENT IS HERE ONLY FOR PAIN MEDICATION ONLY.  History of Present Illness: Marie Willis is a 55 year old woman with pmh significant for back pain, HTN, and depression who presents for follow up regarding back pain. Due to some discrepency with pain medication, patient was advised to follow up in clinic. Due to affordability, pt was not able to get oxycontin, and has been on morphine sulphate 30 mg tab, taking 3 daily. She returns today for refill.   Back pain: She has been followed by Dr Doroteo Bradford for pain management of her back in the past and he discussed various treatment options. She is still deciding if she wants to go back to him for further evaluation. Her back pain is described as severe back pain, worse with leaning forward and better with standing straight. Radiation to left leg and buttuck sometimes but never below knee joint.   She denies any new sicknesses or hospitalizations, no chest pain episodes, no fevers, no chills, no abdominal or urinary concerns. No recent changes in appetite, weight.     Current Medications (verified): 1)  Lisinopril 20 Mg Tabs (Lisinopril) .... Take 1 Tablet By Mouth Once A Day 2)  Paroxetine Hcl 20 Mg Tabs (Paroxetine Hcl) .... Take 2  Tablet By Mouth Once A Day At Bedtime 3)  Morphine Sulfate  30 Mg Tabs (Morphine Sulfate) .... Take 1 Tab By Mouth Every 6-8 Hours As Needed For Back Pain, Not To Exceed 3 Per Day.  Allergies (verified): 1)  ! Steroids 2)  ! Ambien (Zolpidem Tartrate) 3)  ! Sulfa 4)  ! Neurontin (Gabapentin) 5)  ! Amoxicillin 6)  ! 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  VS reviewed and BP wnl Head:  normocephalic and atraumatic.   Neck:  supple.   Lungs:  normal respiratory effort and normal breath sounds.   Heart:  normal rate and regular rhythm.   Abdomen:  soft and non-tender.   Msk:  walks with cane due to back pain  Extremities:  no edema noted  Neurologic:  nonfocal    Impression & Recommendations:  Problem # 1:  LOW BACK PAIN (ICD-724.2)  No signs of overdose or abuse. The initial confusion behind prescription was result of cost issues. Pt was initially prescribed long acting narcotics, however pt was unable to afford it. Before leaving the office during that time, she notified Dr. Eben Burow and he had changed it to morphine sulphate 30 mg three times a day as needed for back pain. However, it was not changed in EMR, so there was concern that she may be on too much narcotic medication. Pt brought her bottle to visit which is empty. She says sometimes she does take 4 tabs instead of 3, however is content with current prescription. Furthermore, she is leaning towards seeing Dr. Doroteo Bradford for possible nerve  block injection. For now, will obtain UDS, and refill prescription. Of note, prescription was hand written, since EMR was down.   Her updated medication list for this problem includes:    Morphine Sulfate 30 Mg Tabs (Morphine sulfate) .Marland Kitchen... Take 1 tab by mouth every 6-8 hours as needed for back pain, not to exceed 3 per day.  Orders: T-Drug Screen-Urine, (single) [id number])  Complete Medication List: 1)  Lisinopril 20 Mg Tabs (Lisinopril) .... Take 1 tablet by mouth once a day 2)  Paroxetine Hcl 20 Mg Tabs (Paroxetine hcl) .... Take 2  tablet by mouth once a day at bedtime 3)  Morphine Sulfate 30 Mg Tabs (Morphine sulfate) .... Take 1 tab by mouth every 6-8 hours as needed for back pain, not to exceed 3 per day.   Orders Added: 1)  T-Drug Screen-Urine, (single) [[id number]] 2)  Est. Patient Level [name] [72536]   Process Orders Check Orders Results:     [facility]: ABN not required for this insurance Tests Sent for requisitioning (April 15, 2010 4:35 PM):     04/15/2010: [facility] -- T-Drug Screen-Urine, (single) [[id number]] (signed)

## 2010-06-03 NOTE — Progress Notes (Signed)
Summary: issues with paxil//kg  Phone Note Call from Patient   Summary of Call: Pt states that the increased Paxil dose is making her sleep too much.  Klonopin was working better. Wants to know if MD would switch her to something different or back to the klonopin.  Paxil 20mg  didnt work for her and thats why she states md went up to 40mg , but now she's sleeping too much.  Pt advised that Dr Marie Willis is [place] the [place], but I will forward message to his desktop.  She has been on the increased dose now since 03/16/10.  Pt understands it may be next week until problem gets addressed and agreed with plan.Marie Willis [facility])  May 14, 2010 3:48 PM     Follow-up for Phone Call        patient was started on increased dose of Paxil for anxiety and we wanted to go down on her Klonipine dose. We may go down to the previous dose of 20 mg per day until she sees me in the clinic. Please inform patient about the new dose. Follow-up by: Lars Mage MD,  May 28, 2010 12:54 PM    New/Updated Medications: PAROXETINE HCL 20 MG TABS (PAROXETINE HCL) Take 1  tablet by mouth once a day at bedtime

## 2010-06-10 ENCOUNTER — Other Ambulatory Visit: Payer: Self-pay | Admitting: *Deleted

## 2010-06-10 MED ORDER — MORPHINE SULFATE 30 MG PO TABS: 30.0000 mg | ORAL_TABLET | Freq: Four times a day (QID) | ORAL | Status: DC | PRN

## 2010-06-10 NOTE — Telephone Encounter (Signed)
rx kept in sample room.

## 2010-06-10 NOTE — Telephone Encounter (Signed)
Rx kept in sample room. 

## 2010-06-16 ENCOUNTER — Encounter: Payer: Self-pay | Admitting: Family Medicine

## 2010-06-16 ENCOUNTER — Ambulatory Visit (INDEPENDENT_AMBULATORY_CARE_PROVIDER_SITE_OTHER): Payer: Self-pay | Admitting: Family Medicine

## 2010-06-16 DIAGNOSIS — M25529 Pain in unspecified elbow: Secondary | ICD-10-CM

## 2010-06-16 DIAGNOSIS — M25569 Pain in unspecified knee: Secondary | ICD-10-CM

## 2010-06-19 ENCOUNTER — Inpatient Hospital Stay (INDEPENDENT_AMBULATORY_CARE_PROVIDER_SITE_OTHER)
Admission: RE | Admit: 2010-06-19 | Discharge: 2010-06-19 | Disposition: A | Discharge status: Home / Self Care | Payer: Self-pay | Source: Ambulatory Visit | Attending: Family Medicine | Admitting: Family Medicine

## 2010-06-19 DIAGNOSIS — K047 Periapical abscess without sinus: Secondary | ICD-10-CM

## 2010-06-23 NOTE — Assessment & Plan Note (Signed)
Summary: NP,ELBOW/KNEE PAIN,MC   Vital Signs:  Patient profile:   55 year old female Height:      66 inches Weight:      200 pounds BP sitting:   121 / 82  Vitals Entered By: Rochele Pages RN (June 16, 2010 1:39 PM)  Primary Provider:  Lars Mage MD  CC:  L elbow pain and L knee pain.  History of Present Illness: Left Elbow - Patient has had pain on and off for >10 years. Patient describes the pain as shooting from her elbow to her lateral two fingers.  Occasional numbness in 4th & 5th fingers & ulnar aspect of forearm when she has flexion of the arm for extended period or when she wakes up in the morning. This pain occurs several times a week. Denies radiation of pain into upper arm.  The patient tries to not use her left arm as often. Occasionally wraps elbow in ace-wrap which is helpful.  Denies any weakness currently.  Denies injury or trauma.  Using ibuprofen & tylenol for knee, but does help elbow.  Left Knee - Dull ache for >1 year. Patient states her knee hurts 95% of the time. Pain does not radiate. Does not feel locking or catching or sensation of giving out. Patient walks 3-4 blocks 1-2 times per day. Pt. tries to wear supportive shoes most of the time.  Patient takes Ibuprofen or Tylenol with some relief of pain as needed.  Denies injury or trauma.  Patient seeing Dr. Vladimir Creeks - Pain Management for LBP S/P Double Discectomy L5-S1. Taking Morphine Sulfate IR 90mg  Q daily.  Morphine does not seem to help knee pain or elbow pain.  Allergies: 1)  ! Steroids 2)  ! Ambien (Zolpidem Tartrate) 3)  ! Sulfa 4)  ! Neurontin (Gabapentin) 5)  ! Amoxicillin 6)  ! Ultram  Past History:  Past Medical History: Last updated: 07/16/2009 Anxiety Depression Hypertension Low back pain  Past Surgical History: Last updated: 07/16/2009 Lumbar laminectomy  Social History: Last updated: 10/07/2009 Former hairdresser, currently unemployed. Live at home with husband and 3 dogs Smokes 1  ppd Does not drink alcohol Denies any other drug abuse  Review of Systems       per HPI, otherwise 10-pt ROS was negative  Physical Exam  General:  Well-developed,well-nourished,in no acute distress; alert,appropriate and cooperative throughout examination Head:  normocephalic and atraumatic.   Eyes:  vision grossly intact.   Mouth:  MM moist & pink Lungs:  normal respiratory effort.   Msk:  Lt upper ext:  FROM of the elbow without pain.  Tenderness to palpation - medial epicondyle, minimal TTP over lateral epicondyle.  Pain along medial epicondyle with resisted wrist flexion, no significant pain with resited wrist extension.  Tinel test at med epicondyle reproduces numbness/tingling in 4th/5th fingers in hand.  Normal grip strength. - R elbow & wrist with FROM without pain, swelling, tenderness, weakness.  L Knee - no swelling or effusion.  FROM without pain.  Mild tenderness with palpation anterior medial aspect of patella MS 5/5 knee flexion/extension - MS 4/5 hip flexion  Negative McMurray, Negative Anterior Drawer, Negative Lachman, neg varus/valgus stress testing. Neg patellar apprehension.    R knee- FROM without pain, swelling, tenderness, weakness, or instability  GAIT: no leg length difference.  antalgic gait.  Neurovascularly intact distally Skin:  no rashes or lesions   Impression & Recommendations:  Problem # 1:  ELBOW PAIN, LEFT (ICD-719.42) Assessment Unchanged  - Lt elbow pain  secondary to medical epicondylitis.  Counterforce brace given to use with activity. Patient given stretching & strengthening exercises for wrist/forearm.  Will follow up with patient in 4-6 weeks to see if any improvement in symptoms.  Orders: Upper Limb Ortosis (wrist loop, arm band, finger splint) (Z6109)  Problem # 2:  KNEE PAIN, LEFT (ICD-719.46) Assessment: New Pain likely secondary to arthritis.  Discussed tx options with tylenol/OTC NSAIDs, home exercises, corticosteroid  injection, topical NSAIDs.  Pt elected to use HEP (quad sets, straight leg raises, short-arch leg extensions, & hip exercises) & continue to use tylenol & ibuprofen.  Hx of adverse reaction to topical & oral steroids in the past.  Pt hesitant to try topical NSAID due to reaction to topical steroids in the past. Will follow up patient in 4-6 weeks to monitor.  Her updated medication list for this problem includes:    Morphine Sulfate 30 Mg Tabs (Morphine sulfate) .Marland Kitchen... Take 1 tab by mouth every 6-8 hours as needed for back pain, not to exceed 3 per day.  Complete Medication List: 1)  Lisinopril 20 Mg Tabs (Lisinopril) .... Take 1 tablet by mouth once a day 2)  Paroxetine Hcl 20 Mg Tabs (Paroxetine hcl) .... Take 1  tablet by mouth once a day at bedtime 3)  Morphine Sulfate 30 Mg Tabs (Morphine sulfate) .... Take 1 tab by mouth every 6-8 hours as needed for back pain, not to exceed 3 per day.   Orders Added: 1)  New Patient Level [name] [60454] 2)  Upper Limb Ortosis (wrist loop, arm band, finger splint) [L3999]

## 2010-06-28 ENCOUNTER — Encounter: Payer: Self-pay | Admitting: Internal Medicine

## 2010-07-08 ENCOUNTER — Other Ambulatory Visit: Payer: Self-pay | Admitting: *Deleted

## 2010-07-08 NOTE — Telephone Encounter (Signed)
Pt will pick up Friday; uses Transportation.

## 2010-07-10 MED ORDER — MORPHINE SULFATE 30 MG PO TABS: 30.0000 mg | ORAL_TABLET | Freq: Four times a day (QID) | ORAL | Status: DC | PRN

## 2010-07-13 LAB — CREATININE, SERUM
Creatinine, Ser: 1.14 mg/dL (ref 0.4–1.2)
GFR calc Af Amer: >60 mL/min (ref >60)
GFR calc non Af Amer: 50 mL/min — ABNORMAL LOW (ref >60)

## 2010-07-17 ENCOUNTER — Other Ambulatory Visit: Payer: Self-pay | Admitting: *Deleted

## 2010-07-17 MED ORDER — LISINOPRIL 20 MG PO TABS: 20.0000 mg | ORAL_TABLET | Freq: Every day | ORAL | Status: DC

## 2010-07-20 ENCOUNTER — Ambulatory Visit: Payer: Self-pay | Admitting: Internal Medicine

## 2010-07-21 ENCOUNTER — Ambulatory Visit: Payer: Self-pay | Admitting: Family Medicine

## 2010-07-27 ENCOUNTER — Encounter: Payer: Self-pay | Admitting: Internal Medicine

## 2010-08-06 ENCOUNTER — Other Ambulatory Visit: Payer: Self-pay | Admitting: *Deleted

## 2010-08-07 MED ORDER — MORPHINE SULFATE 30 MG PO TABS: 30.0000 mg | ORAL_TABLET | Freq: Four times a day (QID) | ORAL | Status: DC | PRN

## 2010-08-07 NOTE — Telephone Encounter (Signed)
Ms. Paschal no showed the last appointment with Dr. Eben Burow (07/27/2010).  I will refill this medication only if she schedules an appointment with Dr. Eben Burow before leaving the clinic today.  If she no shows that appointment I will recommend that narcotics are no longer prescribed until she is seen in clinic to assure continued morphine is appropriate and in her best interests.

## 2010-08-10 ENCOUNTER — Telehealth: Payer: Self-pay | Admitting: *Deleted

## 2010-08-10 NOTE — Telephone Encounter (Signed)
Pt's spouse calls and cancels appt for wed and appt is rescheduled for 4/23 @ 1045.

## 2010-08-12 ENCOUNTER — Ambulatory Visit: Payer: Self-pay | Admitting: Internal Medicine

## 2010-08-17 ENCOUNTER — Encounter: Payer: Self-pay | Admitting: Internal Medicine

## 2010-08-17 ENCOUNTER — Ambulatory Visit (INDEPENDENT_AMBULATORY_CARE_PROVIDER_SITE_OTHER): Payer: Self-pay | Admitting: Internal Medicine

## 2010-08-17 DIAGNOSIS — F411 Generalized anxiety disorder: Secondary | ICD-10-CM

## 2010-08-17 DIAGNOSIS — J0110 Acute frontal sinusitis, unspecified: Secondary | ICD-10-CM

## 2010-08-17 DIAGNOSIS — I10 Essential (primary) hypertension: Secondary | ICD-10-CM

## 2010-08-17 DIAGNOSIS — F17200 Nicotine dependence, unspecified, uncomplicated: Secondary | ICD-10-CM

## 2010-08-17 DIAGNOSIS — M25529 Pain in unspecified elbow: Secondary | ICD-10-CM

## 2010-08-17 MED ORDER — LISINOPRIL 20 MG PO TABS: 20.0000 mg | ORAL_TABLET | Freq: Every day | ORAL | Status: DC

## 2010-08-17 MED ORDER — AZITHROMYCIN 500 MG PO TABS: 500.0000 mg | ORAL_TABLET | Freq: Every day | ORAL | Status: AC

## 2010-08-17 NOTE — Patient Instructions (Signed)
Sinusitis Sinuses are air pockets within the bones of your face. The growth of bacteria within a sinus leads to infection. Infection keeps the sinuses from draining. This infection is called sinusitis. SYMPTOMS There will be different areas of pain depending on which sinuses have become infected.  The maxillary sinuses often produce pain beneath the eyes.   Frontal sinusitis may cause pain in the middle of the forehead and above the eyes.  Other problems (symptoms) include:  Toothaches.   Colored, pus-like (purulent) drainage from the nose.   Any swelling, warmth, or tenderness over the sinus areas may be signs of infection.  TREATMENT Sinusitis is most often determined by an exam and you may have x-rays taken. If x-rays have been taken, make sure you obtain your results. Or find out how you are to obtain them. Your caregiver may give you medications (antibiotics). These are medications that will help kill the infection. You may also be given a medication (decongestant) that helps to reduce sinus swelling.  HOME CARE INSTRUCTIONS  Only take over-the-counter or prescription medicines for pain, discomfort, or fever as directed by your caregiver.   Drink extra fluids. Fluids help thin the mucus so your sinuses can drain more easily.   Applying either moist heat or ice packs to the sinus areas may help relieve discomfort.   Use saline nasal sprays to help moisten your sinuses. The sprays can be found at your local drugstore.  SEEK IMMEDIATE MEDICAL CARE IF YOU DEVELOP:  High fever that is still present after two days of antibiotic treatment.   Increasing pain, severe headaches, or toothache.   Nausea, vomiting, or drowsiness.   Unusual swelling around the face or trouble seeing.  MAKE SURE YOU:   Understand these instructions.   Will watch your condition.   Will get help right away if you are not doing well or get worse.  Document Released: 04/12/2005 Document Re-Released:  03/25/2008 [facility] Patient Information 2011 [place], [place].  Follow up as needed. Steam therapy as discussed will surely help.

## 2010-08-17 NOTE — Progress Notes (Signed)
  Subjective:    Patient ID: Marie Willis, female    DOB: 09-01-1955, 55 y.o.   MRN: [id number]  HPI  Marie Willis is a 55 year old woman with past medical history most significant for headaches and low back pain for which she is on chronic narcotics.   Patient comes in today for a new onset of fever and a bandlike headache going across her forehead since last 5-6 days. Patient also complains of nasal discharge. Patient checked her fever yesterday which was at 101. Patient said that she usually has sinus symptoms in spring time. No complaints of chills, shortness of breath, ear discharge, difficulty in vision, weight loss. Patient is also complaining of cough with white sputum over the last 5-6 days but it has become better since she started taking over-the-counter cough syrup.   Patient complains that Paxil which was started for anxiety purposes isn't really working at current doses. She was advised to take 40 mg which she could not tolerate that and was sleeping most of the day.  No other complaints at this time  Review of Systems  Constitutional: Positive for fever and fatigue. Negative for chills, activity change and appetite change.  HENT: Positive for rhinorrhea and sneezing. Negative for sore throat.   Eyes: Positive for redness.  Respiratory: Negative for cough and shortness of breath.   Cardiovascular: Negative for chest pain and leg swelling.  Gastrointestinal: Negative for nausea, abdominal pain, diarrhea, constipation and abdominal distention.  Genitourinary: Negative for frequency, hematuria and difficulty urinating.  Neurological: Negative for dizziness and headaches.  Psychiatric/Behavioral: Negative for suicidal ideas and behavioral problems.       Objective:   Physical Exam  Constitutional: She is oriented to person, place, and time. She appears well-developed and well-nourished.  HENT:  Head: Normocephalic and atraumatic.         Frontal sinuses were tender to  palpation.  Eyes: Conjunctivae and EOM are normal. Pupils are equal, round, and reactive to light. No scleral icterus.  Neck: Normal range of motion. Neck supple. No JVD present. No thyromegaly present.  Cardiovascular: Normal rate, regular rhythm, normal heart sounds and intact distal pulses.  Exam reveals no gallop and no friction rub.   No murmur heard. Pulmonary/Chest: Effort normal and breath sounds normal. No respiratory distress. She has no wheezes. She has no rales.  Abdominal: Soft. Bowel sounds are normal. She exhibits no distension and no mass. There is no tenderness. There is no rebound and no guarding.  Musculoskeletal: Normal range of motion. She exhibits no edema and no tenderness.  Lymphadenopathy:    She has no cervical adenopathy.  Neurological: She is alert and oriented to person, place, and time.  Psychiatric: She has a normal mood and affect. Her behavior is normal.          Assessment & Plan:

## 2010-08-18 NOTE — Assessment & Plan Note (Signed)
Patient was sent to a sports medicine doctor who gave her an elbow strain which has been working correctly. Patient did not complain of any of the pain to me.

## 2010-08-18 NOTE — Assessment & Plan Note (Signed)
Well-controlled  at this time 

## 2010-08-18 NOTE — Assessment & Plan Note (Signed)
Patient is a down on smoking significantly and she plans to quit by herself.

## 2010-08-18 NOTE — Assessment & Plan Note (Signed)
Patient was advised to take her Paxil 20 mg every day and 40 mg every other day.

## 2010-08-20 ENCOUNTER — Telehealth: Payer: Self-pay | Admitting: *Deleted

## 2010-08-20 NOTE — Telephone Encounter (Signed)
Pt calls and states that dr garg instructed her to take 1 the first day and 1/2 there after daily, directions on med list state differently, please  Advise  Thank you, h.

## 2010-08-21 NOTE — Telephone Encounter (Signed)
She must take it as advised by me in the clinic. Patient was called and message was relayed.

## 2010-08-28 ENCOUNTER — Telehealth: Payer: Self-pay | Admitting: *Deleted

## 2010-08-28 NOTE — Telephone Encounter (Signed)
Agree with recs.

## 2010-08-28 NOTE — Telephone Encounter (Signed)
Pt called in today. She was seen on 4/23 on clinic for sinus infection. She has taken Z pac and is feeling better.  She finished meds last Saturday.  On Sunday fever back to 104, taking tylenol and using vaporizer.   Fever is now down to 99 or below.  Sleeping better and feeling much better.  Pt advised to continue fluids, rest and call if fever returns.

## 2010-09-02 ENCOUNTER — Other Ambulatory Visit: Payer: Self-pay | Admitting: *Deleted

## 2010-09-02 NOTE — Telephone Encounter (Signed)
Last refill 4/12 Will pick up Friday Phone # [phone]

## 2010-09-03 MED ORDER — MORPHINE SULFATE 30 MG PO TABS: 30.0000 mg | ORAL_TABLET | Freq: Four times a day (QID) | ORAL | Status: DC | PRN

## 2010-09-04 NOTE — Telephone Encounter (Signed)
Rx ready and pt informed 

## 2010-09-11 NOTE — H&P (Signed)
[facility]  Patient:    Marie Willis, Marie Willis                       MRN: [id number] Adm. Date:  [id number] Attending:  Thyra Breed CC:         Danae Orleans. Venetia Maxon, M.D.   History and Physical  HISTORY OF PRESENT ILLNESS:  Mushka comes in for followup evaluation of her chronic low-back pain on the basis of scarring around the L4 and L5 nerves with a chronic radiculopathy.  Since her last evaluation, she has had minimal back discomfort but continues to have left buttock and hip discomfort, which reoccurs.  It has a burning quality to it.  It is improved by medications, heat, and being off her feet and made worse by just overexertion.  She has recently been on an antibiotic for a bronchitis and is asking about a cough syrup that I am not familiar with and I advised her I could not prescribe it without knowing what was in it.  PHYSICAL EXAMINATION:  VITAL SIGNS:  Blood pressure 128/78, heart rate 78, respiratory rate 20.  O2 saturation 97%.  Pain level is 4/10.  NEUROLOGIC:  Straight leg raise signs are negative.  Deep tendon reflexes were symmetric in the lower extremities.  The patient is much better compensated than the last time.  IMPRESSION: 1. Chronic low-back pain with scarring around the L4 and L5 nerve roots with    chronic lumbar radiculopathy to the left. 2. Neck pain, improved on opiates. 3. Situational anxiety, improved.  DISPOSITION: 1. Continue on OxyContin 40 mg one p.o. b.i.d. #60, which she got last week    and does not need a refill. 2. Soma 350 mg one p.o. q.8-12h. #100 with two refills. 3. Continue other medications. 4. Follow up with me in 4-8 weeks. DD:  11/07/00 TD:  11/07/00 Job: [id number]

## 2010-09-11 NOTE — H&P (Signed)
[facility]  Patient:    Marie Willis, Marie Willis                       MRN: [id number] Adm. Date:  [id number] Attending:  Thyra Breed CC:         Danae Orleans. Venetia Maxon, M.D.   History and Physical  FOLLOWUP EVALUATION  HISTORY OF PRESENT ILLNESS:  Philicia comes in for follow-up evaluation of her chronic low back pain with scarring around the L5 and L4 nerves to the left with moderate spinal stenosis and small herniated disc.  The patient has had off and on left hip discomfort, but this seems to be coming under a little bit better control.  She notes that it has taken away the sharp quality, but she is left with a burning discomfort.  She recently lost her job and has been very upset about this, feels very depressed, and is asking about seeing a psychologist which I encouraged her to do.  She describes not new neurologic symptoms.  CURRENT MEDICATIONS: 1. Paxil. 2. Zyrtec 3. OxyContin 40 mg 2 x q.d. 4. Soma 1 2 x q.d. 5. Xanax p.r.n.  PHYSICAL EXAMINATION:  VITAL SIGNS:  Blood pressure 127/74, heart rate 91, respiratory rate 16, O2 saturation 98%.  Pain level 6/10.  NEUROLOGIC:  The patient demonstrates symmetric deep tendon reflexes at the knees.  Negative straight leg raise signs.  She has minimal changes in her gait.  She is teary eyed over the loss of her job.  IMPRESSION: 1. Chronic low back pain with scarring around L4/L5 nerves to the left with    underlying spinal stenosis and small herniated disc. 2. Neck pain which is minimal on current dose of opiates. 3. Situational anxiety due to loss of job. 4. Other medical problems per primary care physician.  DISPOSITION: 1. Stop OxyContin. 2. Duragesic Patch 50 mcg 1 q.3 days, #5.  She was given a coupon for this and    will see if she qualifies for [facility] Patient Assistance Program. 3. Continue other medications. 4. The patient was encouraged to reconsider speaking with psychologist here,    Dr.  Jerrye Beavers. 5. Followup with me in four weeks. DD:  09/08/00 TD:  09/09/00 Job: [id number]
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
## 2010-09-11 NOTE — Op Note (Signed)
[place]. [facility]  Patient:    LYRICA, MCCLARTY                       MRN: [id number] Proc. Date: 08/14/99 Adm. Date:  [id number] Attending:  Josie Saunders                           Operative Report  PREOPERATIVE DIAGNOSIS:  Herniated lumbar disk of L4-5 with radiculopathy on the left at L5 nerve root, spondylosis, and degenerative disk disease.  POSTOPERATIVE DIAGNOSIS:  Herniated lumbar disk of L4-5 with radiculopathy on the left of L5 nerve root, spondylosis, and degenerative disk disease.  PROCEDURE:  Bilateral hemi-semilaminectomy of L4-5 with left L4-5 microdiskectomy and microdissection.  SURGEON:  Danae Orleans. Venetia Maxon, M.D.  ASSISTANT:  Alanson Aly. Roxan Hockey, M.D.  ANESTHESIA:  General endotracheal.  ESTIMATED BLOOD LOSS:  Less than 100 cc.  COMPLICATIONS:  None.  DISPOSITION:  Recovery.  INDICATIONS:  The patient is a 55 year old woman with left L5 radiculopathy and  herniated central to left-sided herniated lumbar disk at L4-5 level.  It was elected to take her to surgery for lumbar microdiskectomy.  This was to comprise of bilateral laminotomies and unilateral diskectomy, L4-5, left.  DESCRIPTION OF PROCEDURE:  The patient was brought to the operating room. Following the satisfactory and uncomplicated induction of general endotracheal anesthesia and placement of intravenous lines, she was placed in a prone position on the Wilson frame.  Her low back was prepped and draped in the usual sterile fashion.  The area of planned incision was infiltrated with 0.25% Marcaine, 0.5% lidocaine, and 1:200,000 epinephrine.  An incision was made overlying the L4-5 interspace and carried through 2 inches of adipose tissue ________ was then incised bilaterally with electrocautery, preserving the posterior ligament. The subperiosteal dissection was then performed exposing the L4-5 interspace.  A self-retaining retractor was placed.   Intraoperative x-ray confirmed orientation at the L4-5 level.  The Midas Rex with the AM8 bur was then used to perform laminotomies of L4 bilaterally, and subsequently, this was completed using Kerrison rongeurs.  The ligamentum flavum was removed in a piecemeal fashion and the L5 nerve root was also decompressed initially on the left.  Subsequently, a similar decompression was performed on the right.  A smaller laminotomy was created on he right side.  The microscope was then brought into the field and the remainder of the procedure was performed using microdissection technique.  The thecal sac and L5 nerve root were retracted medially _______ lateral recesses further decompressed with Kerrison rongeurs.  There was a significant subligamentous disk herniation and epidural veins were cauterized with bipolar electrocautery.  The disk space was  then incised with the 15 blade and disk material was removed in a piecemeal fashion.   Epstein curets and ________ downgoing curets were used to further evacuate the disk space of residual disk material.  There were central osteophytes off the L5 body which was removed using curet and mallet.  The central canal and left side of the canal was then felt to be well-decompressed.  Lateral recess and foraminal area was also decompressed of disk material.  Following diskectomy, the annular edges were cauterized with bipolar electrocautery.  Hemostasis was assured with Gelfoam soaked in thrombin.  Attention was then turned to the right side where the thecal sac and L5 nerve root were retracted medially with the DErrico nerve root retractor.  The floor  of the canal was felt to be well-decompressed.  There was no significant disk bulging t this level.  Epidural veins were cauterized with bipolar electrocautery and it as elected to leave the annulus intact and not incise it.  It was felt that the thecal sac and L5 nerve root were  well-decompressed on the right.  Hemostasis was again assured with Gelfoam soaked in thrombin.  The disk space was then irrigated copiously with Bacitracin and saline.  Self-retaining retractor was removed after placing some subcutaneous fat in the epidural space overlying the thecal sac and nerve roots.  The lumbodorsal fascia was then reapproximated with 0 Vicryl suture.  The subcutaneous fat reapproximated with 2-0 Vicryl interrupted and inverted sutures, and the skin edges reapproximated with interrupted 2-0 and 3-0 Vicryl subcuticular stitch.  The wound was then dressed with Dermabond.  The patient tolerated the operation well and was taken to the recovery room in stable and satisfactory condition, having tolerated the operation well. DD:  08/14/99 TD:  08/14/99 Job: [id number]
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
## 2010-09-11 NOTE — H&P (Signed)
[facility]  Patient:    Marie Willis, Marie Willis                       MRN: [id number] Adm. Date:  [id number] Attending:  Thyra Breed CC:         Danae Orleans. Venetia Maxon, M.D.   History and Physical  FOLLOW UP EVALUATION:  Dion comes in for follow up evaluation today.  Since her last evaluation, she has noted that as long as she is taking 20 mg of OxyContin three times a day she has fairly significant control of her pain early in the week, but as the week wanes on due to demands at work, she has increasing discomfort.  She describes the pain as a sharp pain associated with burning predominantly over the hip region.  Standing and bending makes it worse.  She has been tried on tricyclics in the past with no improvement, Neurontin with no improvement and an anticonvulsant which she states starts with a K or C which has not been very helpful.  She is off the Topamax which made her hyper.  CURRENT MEDICATIONS: 1. OxyContin 20 mg one p.o. q.8h. 2. Soma 350 mg q.12h. 3. She does not list the Seroquel, the Xanax or the Paxil or Zyrtec from her    last visit for some reason.  PHYSICAL EXAMINATION:  VITAL SIGNS:  Blood pressure 115/92, heart rate 85, respiratory rate 20, O2 saturations 97%, pain level is 4 out of 10.  The patients spirits are good. She has intact gait.  Her neurologic exam is significant deep tendon reflexes.  IMPRESSION: 1. Chronic low back pain with scarring around the L4 and L5 nerves to the left    with underlying moderate spinal stenosis and small herniated disk. 2. Neck discomfort which seems to have resolved fairly significantly with the    opiates characterize this cervical degenerative disk disease. 3. Other medical problems per primary care physician.  DISPOSITION: 1. Consolidate the OxyContin to 40 mg one p.o. b.i.d. #60 with no refills.    She was advised that she would have to maintain this dose and not increase    it for the next four  weeks. 2. She is to keep a diary of her pain.  I advised her that she really does not    need to call us every time her pain flares up, but I would like to know the    character of her pain and the inciting events around any exacerbations of    her pain between now and her next appointment. 3. Follow up with me in four weeks. 4. Continue with Tens Unit. 5. The patient seems very uninterested in physical therapy or behavioral    medicine at this point, so we will hold off in pursuing this for the time    being.  She may benefit from a trial of Trileptal or Zanaflex at her next    visit if she is not improved on her current medical regimen when we see her    in follow up. DD:  07/18/00 TD:  07/18/00 Job: [id number]

## 2010-09-11 NOTE — Discharge Summary (Signed)
[place]. [facility]  Patient:    Marie Willis, Marie Willis                       MRN: [id number] Adm. Date:  [id number] Disc. Date: [id number] Attending:  Josie Saunders                           Discharge Summary  REASON FOR ADMISSION: 1. Lumbar disk herniation with lumbosacral spondylosis. 2. Lumbosacral disk degenerative disease.  FINAL DIAGNOSES: 1. Lumbar disk herniation with lumbosacral spondylosis. 2. Lumbosacral degenerative disk disease.  HISTORY OF PRESENT ILLNESS AND HOSPITAL COURSE:  Marie Willis is a 55 year old woman with herniated central to left-sided disk herniation at L4-5 causing L5 radiculopathy.  She was also complaining of significant low back pain.  It was recommended after Mrs. Moffit did not improve with conservative management that she undergo microdiskectomy.  This was performed on August 14, 1999.  She underwent bilateral hemisemilaminectomies at L4-5 with left L4-5 microdiskectomy.  She did well with this surgery, and had full strength in her lower extremities.  Postoperatively, she was having significant postoperative pain, was requiring intravenous analgesics as well as intravenous Toradol. She was slow to mobilize on August 16, 1999, and was felt to require continued parenteral narcotics as well as intravenous Toradol for another 24 hours.  On August 17, 1999, she was doing much better, with significant pain relief.  Was up and ambulating without difficulty, and was, therefore, discharged home.  DISCHARGE MEDICATIONS: 1. Percocet 1-2 every four hours as needed for pain. 2. Soma 350 mg every six hours as needed for spasm. 3. Regular medications.  DISCHARGE INSTRUCTIONS:  No lifting, bending, twisting, or driving.  To wear her brace when up.  Okay to shower but not to soak her incision.  FOLLOW-UP:  In three weeks with Dr. Venetia Maxon. DD:  08/28/99 TD:  08/31/99 Job: [id number]
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
## 2010-10-05 ENCOUNTER — Other Ambulatory Visit: Payer: Self-pay | Admitting: *Deleted

## 2010-10-05 NOTE — Telephone Encounter (Signed)
Last refill 5/9. Will like to get Wed. # [id number]

## 2010-10-07 MED ORDER — MORPHINE SULFATE 30 MG PO TABS: 30.0000 mg | ORAL_TABLET | Freq: Four times a day (QID) | ORAL | Status: DC | PRN

## 2010-10-07 NOTE — Telephone Encounter (Signed)
Rx written.

## 2010-11-03 ENCOUNTER — Other Ambulatory Visit: Payer: Self-pay | Admitting: *Deleted

## 2010-11-06 ENCOUNTER — Telehealth: Payer: Self-pay | Admitting: *Deleted

## 2010-11-06 MED ORDER — MORPHINE SULFATE 30 MG PO TABS: 30.0000 mg | ORAL_TABLET | Freq: Four times a day (QID) | ORAL | Status: DC | PRN

## 2010-11-06 NOTE — Telephone Encounter (Signed)
Checking dates

## 2010-12-02 ENCOUNTER — Other Ambulatory Visit: Payer: Self-pay | Admitting: *Deleted

## 2010-12-02 NOTE — Telephone Encounter (Signed)
Last refill 7/10

## 2010-12-03 MED ORDER — MORPHINE SULFATE 30 MG PO TABS: 30.0000 mg | ORAL_TABLET | Freq: Four times a day (QID) | ORAL | Status: DC | PRN

## 2010-12-03 NOTE — Telephone Encounter (Signed)
Rx ready - pt called and message left. 

## 2010-12-30 ENCOUNTER — Other Ambulatory Visit: Payer: Self-pay | Admitting: *Deleted

## 2011-01-01 MED ORDER — MORPHINE SULFATE 30 MG PO TABS: 30.0000 mg | ORAL_TABLET | Freq: Four times a day (QID) | ORAL | Status: DC | PRN

## 2011-01-06 ENCOUNTER — Ambulatory Visit: Payer: Self-pay | Admitting: Family Medicine

## 2011-01-08 ENCOUNTER — Ambulatory Visit: Payer: Self-pay | Admitting: Internal Medicine

## 2011-01-10 ENCOUNTER — Inpatient Hospital Stay (INDEPENDENT_AMBULATORY_CARE_PROVIDER_SITE_OTHER)
Admission: RE | Admit: 2011-01-10 | Discharge: 2011-01-10 | Disposition: A | Discharge status: Home / Self Care | Payer: Self-pay | Source: Ambulatory Visit | Attending: Family Medicine | Admitting: Family Medicine

## 2011-01-10 ENCOUNTER — Emergency Department (HOSPITAL_COMMUNITY)
Admission: EM | Admit: 2011-01-10 | Discharge: 2011-01-10 | Disposition: A | Discharge status: Home / Self Care | Payer: Self-pay | Attending: Emergency Medicine | Admitting: Emergency Medicine

## 2011-01-10 DIAGNOSIS — M79609 Pain in unspecified limb: Secondary | ICD-10-CM

## 2011-01-10 DIAGNOSIS — F411 Generalized anxiety disorder: Secondary | ICD-10-CM | POA: Insufficient documentation

## 2011-01-10 DIAGNOSIS — M7989 Other specified soft tissue disorders: Secondary | ICD-10-CM | POA: Insufficient documentation

## 2011-01-10 DIAGNOSIS — I10 Essential (primary) hypertension: Secondary | ICD-10-CM | POA: Insufficient documentation

## 2011-01-10 DIAGNOSIS — Z79899 Other long term (current) drug therapy: Secondary | ICD-10-CM | POA: Insufficient documentation

## 2011-01-10 LAB — POCT I-STAT, CHEM 8
BUN: 13 mg/dL (ref 6–23)
Creatinine, Ser: 1.3 mg/dL — ABNORMAL HIGH (ref 0.50–1.10)
HCT: 39 % (ref 36.0–46.0)
Potassium: 3.9 mEq/L (ref 3.5–5.1)

## 2011-01-11 ENCOUNTER — Ambulatory Visit (HOSPITAL_COMMUNITY)
Admission: RE | Admit: 2011-01-11 | Discharge: 2011-01-11 | Disposition: A | Discharge status: Home / Self Care | Payer: Self-pay | Source: Ambulatory Visit | Attending: Internal Medicine | Admitting: Internal Medicine

## 2011-01-11 ENCOUNTER — Emergency Department (HOSPITAL_COMMUNITY)
Admission: EM | Admit: 2011-01-11 | Discharge: 2011-01-11 | Disposition: A | Discharge status: Home / Self Care | Payer: Self-pay | Attending: Emergency Medicine | Admitting: Emergency Medicine

## 2011-01-11 ENCOUNTER — Encounter: Payer: Self-pay | Admitting: Internal Medicine

## 2011-01-11 DIAGNOSIS — M79609 Pain in unspecified limb: Secondary | ICD-10-CM | POA: Insufficient documentation

## 2011-01-11 DIAGNOSIS — M7989 Other specified soft tissue disorders: Secondary | ICD-10-CM

## 2011-01-11 DIAGNOSIS — I10 Essential (primary) hypertension: Secondary | ICD-10-CM | POA: Insufficient documentation

## 2011-01-11 DIAGNOSIS — R609 Edema, unspecified: Secondary | ICD-10-CM | POA: Insufficient documentation

## 2011-01-11 DIAGNOSIS — I82409 Acute embolism and thrombosis of unspecified deep veins of unspecified lower extremity: Secondary | ICD-10-CM | POA: Insufficient documentation

## 2011-01-11 NOTE — Progress Notes (Signed)
Patient was seen in the ED for a DVT seen on Doppler of LE; however, I did not see the results of the Doppler.  Dr. Rubin Payor agreed to send patient home with Lovenox and Warfarin and care management was involved to help with medication assistance.  He would like for patient to follow up in clinic for INR check.  She will follow up with Dr. Manson Passey 01/12/11 at [facility] and then will need to follow up with Dr. Alexandria Lodge thereafter.

## 2011-01-12 ENCOUNTER — Encounter: Payer: Self-pay | Admitting: Internal Medicine

## 2011-01-12 ENCOUNTER — Ambulatory Visit (INDEPENDENT_AMBULATORY_CARE_PROVIDER_SITE_OTHER): Payer: Self-pay | Admitting: Internal Medicine

## 2011-01-12 VITALS — BP 120/78 | HR 76 | Temp 98.5°F | Ht 66.0 in | Wt 211.5 lb

## 2011-01-12 DIAGNOSIS — I82409 Acute embolism and thrombosis of unspecified deep veins of unspecified lower extremity: Secondary | ICD-10-CM

## 2011-01-12 DIAGNOSIS — Z23 Encounter for immunization: Secondary | ICD-10-CM

## 2011-01-12 DIAGNOSIS — Z0000 Encounter for general adult medical examination without abnormal findings: Secondary | ICD-10-CM

## 2011-01-12 DIAGNOSIS — F329 Major depressive disorder, single episode, unspecified: Secondary | ICD-10-CM

## 2011-01-12 HISTORY — DX: Acute embolism and thrombosis of unspecified deep veins of unspecified lower extremity: I82.409

## 2011-01-12 LAB — PROTIME-INR: Prothrombin Time: 12.2 seconds (ref 11.6–15.2)

## 2011-01-12 MED ORDER — TRAZODONE 25 MG HALF TABLET: 25.0000 mg | ORAL_TABLET | Freq: Every day | ORAL | Status: DC

## 2011-01-12 MED ORDER — ENOXAPARIN SODIUM 150 MG/ML ~~LOC~~ SOLN: 150.0000 mg | Freq: Once | SUBCUTANEOUS | Status: DC

## 2011-01-12 NOTE — Patient Instructions (Addendum)
For your Deep Vein Thrombosis, continue taking Lovenox injections for 3 more days (counting today) -continue to take Warfarin 5 mg per day -follow-up with Dr. Alexandria Lodge in the Coumadin Clinic in 2-3 days  For your difficulty sleeping, stop taking Paxil, and start taking Trazodone 25 mg (1/2 tablet) at bedtime.  If you have no effect, you may take 1 tablets (ie 50 mg) at bedtime.  Follow-up with our clinic in about 2 months  Deep Vein Thrombosis A deep vein thrombosis (DVT) is a blood clot (thrombus) that develops in a deep vein. A DVT is a clot in the deep, larger veins of the leg, arm, or pelvis. These are more dangerous than clots that might form in veins on the surface of the body. Deep vein thrombosis can lead to complications if the clot breaks off and travels in the bloodstream to the lungs. CAUSES Blood clots form in a vein for different reasons. Usually several things cause blood clots. They include:  The flow of blood slows down.   The inside of the vein is damaged in some way.   The person has a condition that makes blood clot more easily. These conditions may include:   Older age (especially over 95 years old).   Having a history of blood clots.   Having major or lengthy surgery. Hip surgery is particularly high-risk.   Breaking a hip or leg.   Sitting or lying still for a long time.   Cancer or cancer treatment.   Having a long, thin tube (catheter) placed inside a vein during a medical procedure.   Being overweight (obese).   Pregnancy and childbirth.   Medicines with estrogen.   Smoking.   Other circulation or heart problems.  SYMPTOMS When a clot forms, it can either partially or totally block the blood flow in that vein. Symptoms of a DVT can include:  Swelling of the leg or arm, especially if one side is much worse.   Warmth and redness of the leg or arm, especially if one side is much worse.   Pain in an arm or leg. If the clot is in the leg, symptoms  may be more noticeable or worse when standing or walking.  If the blood clot travels to the lung, it may cause:  Shortness of breath.   Chest pain. The pain may be worsened by deep breaths.   Coughing up thick mucus (phlegm), possibly flecked with blood.  Anyone with these symptoms should get emergency medical treatment right away. Call your local emergency services (911 in [place] if you have these symptoms. DIAGNOSIS If a DVT is suspected, your caregiver will take a full medical history. He or she will also perform a physical exam. Tests that also may be required include:  Studies of the clotting properties of the blood.   An ultrasound scan.   X-rays to show the flow of blood when special dye is injected into the veins (venography).   Studies of your lungs if you have any chest symptoms.  PREVENTION  Exercise the legs regularly. Take a brisk 30 minute walk every day.   Maintain a weight that is appropriate for your height.   Avoid sitting or lying in bed for long periods of time without moving your legs.   Women, particularly those over the age of 71, should consider the risks and benefits of taking estrogen medicines, including birth control pills.   Do not smoke, especially if you take estrogen medicines.   Long-distance travel  can increase your risk. You should exercise your legs by walking or pumping the muscles every hour.   In hospital prevention:   Prevention may include medical and nonmedical measures.  TREATMENT  The most common treatment for DVT is blood thinning (anticoagulant) medicine, which reduces the blood's tendency to clot. Anticoagulants can stop new blood clots from forming and old ones from growing. They cannot dissolve existing clots. Your body does this by itself over time. Anticoagulants can be given by mouth, by intravenous (IV) access, or by injection. Your caregiver will determine the best program for you.   Less commonly, clot-dissolving drugs  (thrombolytics) are used to dissolve a DVT. They carry a high risk of bleeding, so they are used mainly in severe cases.   Very rarely, a blood clot in the leg needs to be removed surgically.   If you are unable to take anticoagulants, your caregiver may arrange for you to have a filter placed in a main vein in your belly (abdomen). This filter prevents clots from traveling to your lungs.  HOME CARE INSTRUCTIONS  Take all medicines prescribed by your caregiver. Follow the directions carefully.   You will most likely continue taking anticoagulants after you leave the hospital. Your caregiver will advise you on the length of treatment (usually 3 to 6 months, sometimes for life).   Taking too much or too little of an anticoagulant is dangerous. While taking this type of medicine, you will need to have regular blood tests to be sure the dose is correct. The dose can change for many reasons. It is critically important that you take this medicine exactly as prescribed, and that you have blood tests exactly as directed.   Many foods can interfere with anticoagulants. These include foods high in vitamin K, such as spinach, kale, broccoli, cabbage, collard and turnip greens, Brussels sprouts, peas, cauliflower, seaweed, parsley, beef and pork liver, green tea, and soybean oil. Your caregiver should discuss limits on these foods with you or you should arrange a visit with a dietician to answer your questions.   Many medicines can interfere with anticoagulants. You must tell your caregiver about any and all medicines you take. This includes all vitamins and supplements. Be especially cautious with aspirin and anti-inflammatory medicines. Ask your caregiver before taking these.   Anticoagulants can have side effects, mostly excessive bruising or bleeding. You will need to hold pressure over cuts for longer than usual. Avoid alcoholic drinks or consume only very small amounts while taking this medicine.   If  you are taking an anticoagulant:   Wear a medical alert bracelet.   Notify your dentist or other caregivers before procedures.   Avoid contact sports.   Ask your caregiver how soon you can go back to normal activities. Not being active can lead to new clots. Ask for a list of what you should and should not do.   Exercise your lower leg muscles. This is important while traveling.   You may need to wear compression stockings. These are tight elastic stockings that apply pressure to the lower legs. This can help keep the blood in the legs from clotting.   If you are a smoker, you should quit.   Learn as much as you can about DVT.  SEEK MEDICAL CARE IF:  You have unusual bruising or any bleeding problems.   The swelling or pain in your affected arm or leg is not gradually improving.   You anticipate surgery or long-distance travel. You should  get specific advice on DVT prevention.   You discover other family members with blood clots. This may require further testing for inherited diseases or conditions.  SEEK IMMEDIATE MEDICAL CARE IF:  You develop chest pain.   You develop severe shortness of breath.   You begin to cough up bloody mucus or phlegm (sputum).   You feel dizzy or faint.   You develop swelling or pain in the leg.   You have breathing problems after traveling.  MAKE SURE YOU:  Understand these instructions.   Will watch your condition.   Will get help right away if you are not doing well or get worse.  Document Released: 04/12/2005 Document Re-Released: 07/07/2009 [facility] Patient Information 2011 [place], [place].

## 2011-01-12 NOTE — Assessment & Plan Note (Addendum)
Patient notes a history of depression, previously treated with Paxil, though patient has discontinued this medication due to an episode of not being able to remember the proceeding 48 hours after taking this medication. -stop taking paxil -try taking Trazodone 25-50 mg qhs for difficulty sleeping -reassess with PCP at next visit

## 2011-01-12 NOTE — Assessment & Plan Note (Signed)
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  Patient will need treatment for 3-6 months -continue Lovenox x5 days -checking INR today -will schedule an appointment with coumadin clinic

## 2011-01-12 NOTE — Assessment & Plan Note (Signed)
-   flu shot given today 

## 2011-01-12 NOTE — Progress Notes (Signed)
HPI The patient is a 55 yo woman, history of HTN, chronic back pain, and depression, presenting for an ED follow-up for DVT.  4 days ago, the patient developed swelling and pain in her right leg.  She presented to the ED 2 days ago and was found to have a R peroneal vein DVT by LE doppler 01/11/11.  She was started on Lovenox and warfarin 2 days ago.  She reports that her symptoms are unchanged since her ED visit, and that she has been taking her lovenox injections and warfarin as prescribed.  She notes no dyspnea, cp, or SOB.  She notes no period of immobility preceding this episode, no prior confirmed DVT or PE, no prior miscarriages, and does not know her family history (adopted).  She does report an episode of a "knot" in her right upper leg >10 years ago which a friend suggested might be a DVT, and she was taken off of OCP's at that time, though no official work-up or diagnosis was given.  The patient notes no hemoptysis, hematemesis, hematochezia/melena, or other source of bleeding.  She also reports a history of depression.  She was previously treated with Paxil, though she notes that this medication caused an episode of 48 hours of memory loss, so the patient stopped taking this medication.  She notes that xanax has been helpful for her in the past for managing her anxiety.  The patient also has chronic pain, and reports signing a pain contract with her PCP for her morphine dose.  ROS: General: no fevers, chills, changes in weight, changes in appetite Skin: no rash HEENT: no blurry vision, hearing changes, sore throat Pulm: no dyspnea, coughing, wheezing CV: no chest pain, palpitations, shortness of breath Abd: no abdominal pain, nausea/vomiting, diarrhea/constipation GU: no dysuria, hematuria, polyuria Ext: see HPI Neuro: no weakness, numbness, or tingling  Filed Vitals:   01/12/11 1421  BP: 120/78  Pulse: 76  Temp: 98.5 F (36.9 C)    PEX General: alert, cooperative, and in no  apparent distress HEENT: pupils equal round and reactive to light, vision grossly intact, oropharynx clear and non-erythematous  Neck: supple, no lymphadenopathy, JVD, or carotid bruits Lungs: clear to ascultation bilaterally, normal work of respiration, no wheezes, rales, ronchi Heart: regular rate and rhythm, no murmurs, gallops, or rubs Abdomen: soft, non-tender, non-distended, normal bowel sounds Msk: no joint edema, warmth, or erythema Extremities: non-pitting edema noted in right leg to the level of the knee, mild tenderness to palpation of right knee, mild hyperpigmentation of right ankle Neurologic: alert & oriented X3, cranial nerves II-XII intact, strength grossly intact, sensation intact to light touch  Assessment/Plan:

## 2011-01-13 ENCOUNTER — Other Ambulatory Visit: Payer: Self-pay | Admitting: *Deleted

## 2011-01-13 DIAGNOSIS — O2230 Deep phlebothrombosis in pregnancy, unspecified trimester: Secondary | ICD-10-CM

## 2011-01-13 DIAGNOSIS — I82409 Acute embolism and thrombosis of unspecified deep veins of unspecified lower extremity: Secondary | ICD-10-CM

## 2011-01-13 MED ORDER — WARFARIN SODIUM 5 MG PO TABS: 5.0000 mg | ORAL_TABLET | Freq: Every day | ORAL | Status: DC

## 2011-01-13 NOTE — Telephone Encounter (Signed)
The patient was called regarding her lovenox and coumadin.  The patient was informed that she is to continue taking Lovenox injections daily until her appointment with Dr. Alexandria Lodge on Monday, 01/18/11.  She was informed that we have left enough doses of lovenox for her here at the clinic, which she can pick up at her earliest convenience.  The patient expressed understanding, and will come to clinic tomorrow to pick up the Lovenox medication.  She states that she has plenty of coumadin left to get her through Monday, when she will see Dr. Alexandria Lodge.  The patient has set an appointment for 9/24 at 3:00 pm.

## 2011-01-13 NOTE — Progress Notes (Signed)
Dr Manson Passey discussed Ms Mauro' care with me and I agree with his assessment and plan

## 2011-01-18 ENCOUNTER — Ambulatory Visit (INDEPENDENT_AMBULATORY_CARE_PROVIDER_SITE_OTHER): Payer: Self-pay | Admitting: Pharmacist

## 2011-01-18 ENCOUNTER — Telehealth: Payer: Self-pay | Admitting: Pharmacist

## 2011-01-18 ENCOUNTER — Ambulatory Visit: Payer: Self-pay | Admitting: Family Medicine

## 2011-01-18 ENCOUNTER — Ambulatory Visit: Payer: Self-pay

## 2011-01-18 DIAGNOSIS — I80299 Phlebitis and thrombophlebitis of other deep vessels of unspecified lower extremity: Secondary | ICD-10-CM

## 2011-01-18 DIAGNOSIS — I80201 Phlebitis and thrombophlebitis of unspecified deep vessels of right lower extremity: Secondary | ICD-10-CM

## 2011-01-18 DIAGNOSIS — Z7901 Long term (current) use of anticoagulants: Secondary | ICD-10-CM

## 2011-01-18 DIAGNOSIS — I82409 Acute embolism and thrombosis of unspecified deep veins of unspecified lower extremity: Secondary | ICD-10-CM

## 2011-01-18 MED ORDER — WARFARIN SODIUM 5 MG PO TABS: ORAL_TABLET | ORAL | Status: DC

## 2011-01-18 NOTE — Progress Notes (Signed)
Anti-Coagulation Progress Note  Marie Willis is a 55 y.o. female who is currently on an anti-coagulation regimen.    RECENT RESULTS: Recent results are below, the most recent result is correlated with a dose of 35 mg. per week: Lab Results  Component Value Date   INR 1.40 01/18/2011   INR 0.89 01/12/2011   INR 0.85 01/10/2011    ANTI-COAG DOSE:   Latest dosing instructions   Total Sun Mon Tue Wed Thu Fri Sat   40  10 mg 10 mg 10 mg 10 mg       (5 mg2) (5 mg2) (5 mg2) (5 mg2)           ANTICOAG SUMMARY: Anticoagulation Episode Summary              Current INR goal 2.0-3.0 Next INR check 01/22/2011   INR from last check 1.40! (01/18/2011)     Weekly max dose (mg)  Target end date 07/18/2011   Indications    INR check location [facility] Preferred lab    Send INR reminders to    Comments             ANTICOAG TODAY: Anticoagulation Summary as of 01/18/2011              INR goal 2.0-3.0     Selected INR 1.40! (01/18/2011) Next INR check 01/22/2011   Weekly max dose (mg)  Target end date 07/18/2011   Indications     Anticoagulation Episode Summary              INR check location [facility] Preferred lab    Send INR reminders to    Comments             PATIENT INSTRUCTIONS: Patient Instructions  Patient instructed to take medications as defined in the Anti-coagulation Track section of this encounter.  Patient instructed to take today's dose.  Patient verbalized understanding of these instructions.        FOLLOW-UP Return in 4 days (on 01/22/2011) for Follow up INR at 3PM on Friday.Hulen Luster, [name] Pharm.D., CACP

## 2011-01-18 NOTE — Patient Instructions (Signed)
Patient instructed to take medications as defined in the Anti-coagulation Track section of this encounter.  Patient instructed to take today's dose.  Patient verbalized understanding of these instructions.    

## 2011-01-19 ENCOUNTER — Ambulatory Visit: Payer: Self-pay | Admitting: Internal Medicine

## 2011-01-22 ENCOUNTER — Ambulatory Visit (INDEPENDENT_AMBULATORY_CARE_PROVIDER_SITE_OTHER): Payer: Self-pay | Admitting: Pharmacist

## 2011-01-22 DIAGNOSIS — Z7901 Long term (current) use of anticoagulants: Secondary | ICD-10-CM

## 2011-01-22 DIAGNOSIS — I80299 Phlebitis and thrombophlebitis of other deep vessels of unspecified lower extremity: Secondary | ICD-10-CM

## 2011-01-22 DIAGNOSIS — I80201 Phlebitis and thrombophlebitis of unspecified deep vessels of right lower extremity: Secondary | ICD-10-CM

## 2011-01-22 LAB — POCT INR: INR: 2.9

## 2011-01-22 NOTE — Progress Notes (Signed)
Anti-Coagulation Progress Note  Marie Willis is a 55 y.o. female who is currently on an anti-coagulation regimen.    RECENT RESULTS: Recent results are below, the most recent result is correlated with a dose of 10mg  qd since last visit. Lab Results  Component Value Date   INR 2.90 01/22/2011   INR 1.40 01/18/2011   INR 0.89 01/12/2011    ANTI-COAG DOSE:   Latest dosing instructions   Total Sun Mon Tue Wed Thu Fri Sat   32.5 7.5 mg 5 mg 7.5 mg   7.5 mg 5 mg    (5 mg1.5) (5 mg1) (5 mg1.5)   (5 mg1.5) (5 mg1)         ANTICOAG SUMMARY: Anticoagulation Episode Summary              Current INR goal 2.0-3.0 Next INR check 01/27/2011   INR from last check 2.90 (01/22/2011)     Weekly max dose (mg)  Target end date 07/18/2011   Indications    [facility]    Send INR reminders to    Comments             ANTICOAG TODAY: Anticoagulation Summary as of 01/22/2011              INR goal 2.0-3.0     Selected INR 2.90 (01/22/2011) Next INR check 01/27/2011   Weekly max dose (mg)  Target end date 07/18/2011   Indications     Anticoagulation Episode Summary              [facility] check location [facility]    Send INR reminders to    Comments             PATIENT INSTRUCTIONS: Patient Instructions  Patient instructed to take medications as defined in the Anti-coagulation Track section of this encounter.  Patient instructed to take today's dose.  Patient verbalized understanding of these instructions.        FOLLOW-UP Return in 5 days (on 01/27/2011) for Follow up INR at 2:30PM.  Hulen Luster, [name] Pharm.D., CACP

## 2011-01-22 NOTE — Patient Instructions (Signed)
Patient instructed to take medications as defined in the Anti-coagulation Track section of this encounter.  Patient instructed to take today's dose.  Patient verbalized understanding of these instructions.    

## 2011-01-25 ENCOUNTER — Ambulatory Visit (INDEPENDENT_AMBULATORY_CARE_PROVIDER_SITE_OTHER): Payer: Self-pay | Admitting: Family Medicine

## 2011-01-25 ENCOUNTER — Encounter: Payer: Self-pay | Admitting: Family Medicine

## 2011-01-25 VITALS — BP 109/74 | HR 65 | Ht 64.5 in | Wt 200.0 lb

## 2011-01-25 DIAGNOSIS — M7710 Lateral epicondylitis, unspecified elbow: Secondary | ICD-10-CM

## 2011-01-25 DIAGNOSIS — G5620 Lesion of ulnar nerve, unspecified upper limb: Secondary | ICD-10-CM

## 2011-01-25 DIAGNOSIS — M5412 Radiculopathy, cervical region: Secondary | ICD-10-CM

## 2011-01-25 DIAGNOSIS — M25579 Pain in unspecified ankle and joints of unspecified foot: Secondary | ICD-10-CM

## 2011-01-26 NOTE — Progress Notes (Signed)
  Subjective:    Patient ID: Marie Willis, female    DOB: January 10, 1956, 55 y.o.   MRN: [id number]  HPI  Continued left elbow and forearm pain. He improved her brace which she wears during the day hours. She participated in the exercise program for a few weeks but said it did not help. She complains of worsening numbness in her pinky finger on the left hand. Numbness is now constant. It is only in the pinky finger.  #2. Bilateral ankle pain left greater than right area and history of ankle fracture at age 49. She has no hardware in her ankle. 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 Right ankle is also bothering her. Pain in both with a lot of standing or walking. Pain is aching in nature, progressively getting worse over the last few months. She has used ibuprofen and morphine with mild to moderate relief. She has not noticed any ankle swelling.   PERTINENT  PMH / PSH: Hx DVT on anticoagulation  Review of Systems Denies fever, denies unusual weight change.    Objective:   Physical Exam  GENERAL: Well developed, well nourished, no acute distress  ELBOW: Right and left elbow full extension and flexion left elbow is tender to palpation along the cubital tunnel. Ear elbow has effusion, warmth or erythema. The left forearm is mildly tender to palpation in the middle of the finger extensor muscle mass. Tenderness to palpation at the lateral epicondyles. Distally she is neurovascularly intact including intact sensation to soft touch in the pinky finger. On the left. Grip strength is normal.  ANKLES: Left ankle moderately stiff to anterior drawer and there is not a good solid endpoint. The right ankle as a solid normal in point. Inversion and eversion bilaterally are normal but mildly painful. She cannot perform more than 2 he will raises in double stents and cannot perform a single heel raise and single leg stance on either foot. This increases her pain.  GAIT: Antalgic. She  walks with toes appointment outward. Mild amount of ankle valgus on both sides worse on the right. Short stride length.  Review of cervical spine CT reveals small leftward bulging disc at C6-7. There is mild foraminal narrowing of the nerve root.    Assessment & Plan:  #1. Lateral epicondylitis chronic. I would continue the counter force brace in her surgery start the home exercise program #2. Mild ulnar neuritis with some paresthesias in her PT finger. This may be coming from her neck problems. I don't have a solution for this at the time and she has tried gabapentin in the past and had significant visual problems while taking that medication so I don't think trying Lyrica would be beneficial. #3. Bilateral ankle pain. Will get x-rays. She definitely needs to do home exercise program for strengthening. We started her on a giving her handout, going over the exercises in detail and given her Theraband. I will see her in clinic after the x-rays.

## 2011-01-27 ENCOUNTER — Ambulatory Visit: Payer: Self-pay

## 2011-01-27 ENCOUNTER — Other Ambulatory Visit: Payer: Self-pay | Admitting: Family Medicine

## 2011-01-27 ENCOUNTER — Ambulatory Visit (HOSPITAL_COMMUNITY)
Admission: RE | Admit: 2011-01-27 | Discharge: 2011-01-27 | Disposition: A | Discharge status: Home / Self Care | Payer: Self-pay | Source: Ambulatory Visit | Attending: Family Medicine | Admitting: Family Medicine

## 2011-01-27 DIAGNOSIS — M25579 Pain in unspecified ankle and joints of unspecified foot: Secondary | ICD-10-CM

## 2011-01-27 DIAGNOSIS — M898X9 Other specified disorders of bone, unspecified site: Secondary | ICD-10-CM | POA: Insufficient documentation

## 2011-01-27 DIAGNOSIS — M19079 Primary osteoarthritis, unspecified ankle and foot: Secondary | ICD-10-CM | POA: Insufficient documentation

## 2011-01-28 ENCOUNTER — Encounter: Payer: Self-pay | Admitting: Family Medicine

## 2011-01-28 ENCOUNTER — Other Ambulatory Visit: Payer: Self-pay | Admitting: *Deleted

## 2011-01-28 ENCOUNTER — Telehealth: Payer: Self-pay | Admitting: Pharmacist

## 2011-01-28 NOTE — Telephone Encounter (Signed)
w

## 2011-01-28 NOTE — Telephone Encounter (Signed)
Patient seen in [facility]. INR = 2.3. Increased 50mg /wk warfarin as 7.5mg  qd x 6 days; 5mg  on remaining day. Next INR on Monday 15-OCT.

## 2011-01-29 MED ORDER — MORPHINE SULFATE 30 MG PO TABS: 30.0000 mg | ORAL_TABLET | Freq: Four times a day (QID) | ORAL | Status: DC | PRN

## 2011-02-01 ENCOUNTER — Encounter: Payer: Self-pay | Admitting: Family Medicine

## 2011-02-01 ENCOUNTER — Ambulatory Visit (INDEPENDENT_AMBULATORY_CARE_PROVIDER_SITE_OTHER): Payer: Self-pay | Admitting: Family Medicine

## 2011-02-01 VITALS — BP 126/91 | HR 82

## 2011-02-01 DIAGNOSIS — M545 Low back pain: Secondary | ICD-10-CM

## 2011-02-01 NOTE — Progress Notes (Signed)
Patient had to leave before being seen secondary to  transportation issues. She rescheduled.

## 2011-02-08 ENCOUNTER — Ambulatory Visit (INDEPENDENT_AMBULATORY_CARE_PROVIDER_SITE_OTHER): Payer: Self-pay | Admitting: Pharmacist

## 2011-02-08 ENCOUNTER — Ambulatory Visit: Payer: Self-pay | Admitting: Family Medicine

## 2011-02-08 DIAGNOSIS — I80299 Phlebitis and thrombophlebitis of other deep vessels of unspecified lower extremity: Secondary | ICD-10-CM

## 2011-02-08 DIAGNOSIS — I80201 Phlebitis and thrombophlebitis of unspecified deep vessels of right lower extremity: Secondary | ICD-10-CM

## 2011-02-08 DIAGNOSIS — Z7901 Long term (current) use of anticoagulants: Secondary | ICD-10-CM

## 2011-02-08 LAB — POCT INR: INR: 3.7

## 2011-02-08 NOTE — Patient Instructions (Signed)
Patient instructed to take medications as defined in the Anti-coagulation Track section of this encounter.  Patient instructed to take today's dose.  Patient verbalized understanding of these instructions.    

## 2011-02-08 NOTE — Progress Notes (Signed)
Anti-Coagulation Progress Note  Marie Willis is a 55 y.o. female who is currently on an anti-coagulation regimen.    RECENT RESULTS: Recent results are below, the most recent result is correlated with a dose of 47.5 mg. per week: Lab Results  Component Value Date   INR 3.70 02/08/2011   INR 2.90 01/22/2011   INR 1.40 01/18/2011    ANTI-COAG DOSE:   Latest dosing instructions   Total Sun Mon Tue Wed Thu Fri Sat   45 7.5 mg 5 mg 7.5 mg 5 mg 7.5 mg 5 mg 7.5 mg    (5 mg1.5) (5 mg1) (5 mg1.5) (5 mg1) (5 mg1.5) (5 mg1) (5 mg1.5)         ANTICOAG SUMMARY: Anticoagulation Episode Summary              Current INR goal 2.0-3.0 Next INR check 02/22/2011   INR from last check 3.70! (02/08/2011)     Weekly max dose (mg)  Target end date 07/18/2011   Indications    [facility]    Send INR reminders to    Comments             ANTICOAG TODAY: Anticoagulation Summary as of 02/08/2011              INR goal 2.0-3.0     Selected INR 3.70! (02/08/2011) Next INR check 02/22/2011   Weekly max dose (mg)  Target end date 07/18/2011   Indications     Anticoagulation Episode Summary              [facility]    Send INR reminders to    Comments             PATIENT INSTRUCTIONS: Patient Instructions  Patient instructed to take medications as defined in the Anti-coagulation Track section of this encounter.  Patient instructed to take today's dose.  Patient verbalized understanding of these instructions.        FOLLOW-UP Return in 2 weeks (on 02/22/2011) for Follow up INR.  Hulen Luster, [name] Pharm.D., CACP

## 2011-02-15 ENCOUNTER — Encounter: Payer: Self-pay | Admitting: Family Medicine

## 2011-02-15 ENCOUNTER — Ambulatory Visit (INDEPENDENT_AMBULATORY_CARE_PROVIDER_SITE_OTHER): Payer: Self-pay | Admitting: Family Medicine

## 2011-02-15 VITALS — BP 105/73 | HR 68

## 2011-02-15 DIAGNOSIS — M25529 Pain in unspecified elbow: Secondary | ICD-10-CM

## 2011-02-15 DIAGNOSIS — M25579 Pain in unspecified ankle and joints of unspecified foot: Secondary | ICD-10-CM

## 2011-02-15 DIAGNOSIS — M549 Dorsalgia, unspecified: Secondary | ICD-10-CM

## 2011-02-15 NOTE — Progress Notes (Signed)
  Subjective:    Patient ID: Marie Willis, female    DOB: 1955/10/06, 55 y.o.   MRN: [id number]  HPI  Followup bilateral ankle pain, left elbow pain. No change in her ankle pain. Continues to have left fifth finger numbness. Has intermittently used the counter force brace for her lateral epicondylitis.  #2. Mid back pain intermittently. This is improved with wearing different kind of. Pain is achy. Does not radiate. Review of Systems    Pertinent review of systems: negative for fever or unusual weight change.  Objective:   Physical Exam  Vital signs reviewed. GENERAL: Well-developed female no acute distress BACK: Nontender to palpation over the vertebra. Tenderness to palpation in the rhomboid muscle area. Strength 5 out of 5 bilateral upper extremity. Normal flexion and hips normal hyperextension hips. Straight leg raise negative bilaterally ANKLES: Strength 5 out of 5.       Assessment & Plan:  #1. Bilateral ankle pain. Reviewed her x-rays which showed minimal osteophytes. #2. Mid back pain. 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 Recommended pushups. Followup with her PCP.

## 2011-02-18 ENCOUNTER — Telehealth: Payer: Self-pay | Admitting: *Deleted

## 2011-02-18 NOTE — Telephone Encounter (Signed)
Pt calls stating she has appointment with Dr Alexandria Lodge on Monday 10/29 and she has concerns with her leg and wants to know if she needs an OV at the same time. C/o problems in leg where her blood clot was.  When she walks or stands on rt leg knots appear.  No know injury. Patient diagnosed 9/17 with R peroneal vein DVT, which appears to be an unprovoked 1st episode of DVT .  Would you like pt to be seen on Monday?

## 2011-02-18 NOTE — Telephone Encounter (Signed)
Pt given appointment for Monday at 2:45.  Will also see Dr Alexandria Lodge. Pt informed

## 2011-02-18 NOTE — Telephone Encounter (Signed)
yes

## 2011-02-22 ENCOUNTER — Encounter: Payer: Self-pay | Admitting: Internal Medicine

## 2011-02-22 ENCOUNTER — Ambulatory Visit (INDEPENDENT_AMBULATORY_CARE_PROVIDER_SITE_OTHER): Payer: Self-pay | Admitting: Pharmacist

## 2011-02-22 ENCOUNTER — Ambulatory Visit: Payer: Self-pay | Admitting: Internal Medicine

## 2011-02-22 DIAGNOSIS — Z7901 Long term (current) use of anticoagulants: Secondary | ICD-10-CM

## 2011-02-22 DIAGNOSIS — D6859 Other primary thrombophilia: Secondary | ICD-10-CM

## 2011-02-22 DIAGNOSIS — Z86718 Personal history of other venous thrombosis and embolism: Secondary | ICD-10-CM

## 2011-02-22 DIAGNOSIS — I80299 Phlebitis and thrombophlebitis of other deep vessels of unspecified lower extremity: Secondary | ICD-10-CM

## 2011-02-22 DIAGNOSIS — I82409 Acute embolism and thrombosis of unspecified deep veins of unspecified lower extremity: Secondary | ICD-10-CM

## 2011-02-22 DIAGNOSIS — I80201 Phlebitis and thrombophlebitis of unspecified deep vessels of right lower extremity: Secondary | ICD-10-CM

## 2011-02-22 LAB — POCT INR: INR: 2.9

## 2011-02-22 NOTE — Patient Instructions (Signed)
Patient instructed to take medications as defined in the Anti-coagulation Track section of this encounter.  Patient instructed to take today's dose.  Patient verbalized understanding of these instructions.    

## 2011-02-22 NOTE — Progress Notes (Signed)
Anti-Coagulation Progress Note  Marie Willis is a 55 y.o. female who is currently on an anti-coagulation regimen.    RECENT RESULTS: Recent results are below, the most recent result is correlated with a dose of 45 mg. per week: Lab Results  Component Value Date   INR 2.90 02/22/2011   INR 3.70 02/08/2011   INR 2.90 01/22/2011    ANTI-COAG DOSE:   Latest dosing instructions   Total Sun Mon Tue Wed Thu Fri Sat   45 7.5 mg 5 mg 7.5 mg 5 mg 7.5 mg 5 mg 7.5 mg    (5 mg1.5) (5 mg1) (5 mg1.5) (5 mg1) (5 mg1.5) (5 mg1) (5 mg1.5)         ANTICOAG SUMMARY: Anticoagulation Episode Summary              Current INR goal 2.0-3.0 Next INR check 03/15/2011   INR from last check 2.90 (02/22/2011)     Weekly max dose (mg)  Target end date 07/18/2011   Indications DVT (deep venous thrombosis), Encounter for long-term (current) use of anticoagulants, Primary hypercoagulable state, History of DVT (deep vein thrombosis)   [facility]    Send INR reminders to    Comments Commenced being seen in the [facility] 16-SEP-12 after an acute admission for DVT. 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            ANTICOAG TODAY: Anticoagulation Summary as of 02/22/2011              INR goal 2.0-3.0     Selected INR 2.90 (02/22/2011) Next INR check 03/15/2011   Weekly max dose (mg)  Target end date 07/18/2011   Indications DVT (deep venous thrombosis), Encounter for long-term (current) use of anticoagulants, Primary hypercoagulable state, History of DVT (deep vein thrombosis)    Anticoagulation Episode Summary              [facility] location [facility]    Send INR reminders to    Comments Commenced being seen in the [facility] 16-SEP-12 after an acute admission for DVT. She has history of DVT. Second unprovoked DVT would suggest for  continued indefinite anticoagulation with periodic reveiw and assessment for continued indication.            PATIENT INSTRUCTIONS: Patient Instructions  Patient instructed to take medications as defined in the Anti-coagulation Track section of this encounter.  Patient instructed to take today's dose.  Patient verbalized understanding of these instructions.        FOLLOW-UP Return for Follow up INR.  Hulen Luster, [name] Pharm.D., CACP

## 2011-02-24 ENCOUNTER — Encounter: Payer: Self-pay | Admitting: Internal Medicine

## 2011-02-24 ENCOUNTER — Ambulatory Visit (INDEPENDENT_AMBULATORY_CARE_PROVIDER_SITE_OTHER): Payer: Self-pay | Admitting: Internal Medicine

## 2011-02-24 VITALS — BP 109/73 | HR 88 | Temp 97.7°F | Ht 65.0 in | Wt 211.4 lb

## 2011-02-24 DIAGNOSIS — F411 Generalized anxiety disorder: Secondary | ICD-10-CM

## 2011-02-24 DIAGNOSIS — F419 Anxiety disorder, unspecified: Secondary | ICD-10-CM

## 2011-02-24 MED ORDER — DIAZEPAM 5 MG PO TABS: 5.0000 mg | ORAL_TABLET | Freq: Three times a day (TID) | ORAL | Status: DC | PRN

## 2011-02-24 NOTE — Patient Instructions (Signed)
Please, make an appointment with Georges Mouse for smoking cessation. Please, do not drive if you feel somnolent while taking your medications. Please, follow up with Dr. Eben Burow in 4 weeks and call with any questions.

## 2011-02-25 ENCOUNTER — Encounter: Payer: Self-pay | Admitting: Internal Medicine
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
## 2011-02-25 NOTE — Progress Notes (Deleted)
  Subjective:    Patient ID: Marie Willis, female    DOB: May 24, 1955, 55 y.o.   MRN: [id number]  HPI    Review of Systems     Objective:   Physical Exam        Assessment & Plan:

## 2011-02-26 ENCOUNTER — Other Ambulatory Visit: Payer: Self-pay | Admitting: Internal Medicine

## 2011-02-26 MED ORDER — MORPHINE SULFATE 30 MG PO TABS: 30.0000 mg | ORAL_TABLET | Freq: Four times a day (QID) | ORAL | Status: DC | PRN

## 2011-03-15 ENCOUNTER — Ambulatory Visit (INDEPENDENT_AMBULATORY_CARE_PROVIDER_SITE_OTHER): Payer: Self-pay | Admitting: Pharmacist

## 2011-03-15 DIAGNOSIS — D6859 Other primary thrombophilia: Secondary | ICD-10-CM

## 2011-03-15 DIAGNOSIS — I80299 Phlebitis and thrombophlebitis of other deep vessels of unspecified lower extremity: Secondary | ICD-10-CM

## 2011-03-15 DIAGNOSIS — I80201 Phlebitis and thrombophlebitis of unspecified deep vessels of right lower extremity: Secondary | ICD-10-CM

## 2011-03-15 DIAGNOSIS — Z7901 Long term (current) use of anticoagulants: Secondary | ICD-10-CM

## 2011-03-15 DIAGNOSIS — I82409 Acute embolism and thrombosis of unspecified deep veins of unspecified lower extremity: Secondary | ICD-10-CM

## 2011-03-15 DIAGNOSIS — Z86718 Personal history of other venous thrombosis and embolism: Secondary | ICD-10-CM

## 2011-03-15 LAB — POCT INR: INR: 3.8

## 2011-03-15 NOTE — Progress Notes (Signed)
Anti-Coagulation Progress Note  Marie Willis is a 55 y.o. female who is currently on an anti-coagulation regimen.    RECENT RESULTS: Recent results are below, the most recent result is correlated with a dose of 45 mg. per week: Lab Results  Component Value Date   INR 3.80 03/15/2011   INR 2.90 02/22/2011   INR 3.70 02/08/2011    ANTI-COAG DOSE:   Latest dosing instructions   Total Sun Mon Tue Wed Thu Fri Sat   40 7.5 mg 5 mg 5 mg 5 mg 7.5 mg 5 mg 5 mg    (5 mg1.5) (5 mg1) (5 mg1) (5 mg1) (5 mg1.5) (5 mg1) (5 mg1)         ANTICOAG SUMMARY: Anticoagulation Episode Summary              Current INR goal 2.0-3.0 Next INR check 04/12/2011   INR from last check 3.80! (03/15/2011)     Weekly max dose (mg)  Target end date 07/18/2011   Indications DVT (deep venous thrombosis), Encounter for long-term (current) use of anticoagulants, Primary hypercoagulable state, History of DVT (deep vein thrombosis)   [facility] location [facility]    Send INR reminders to    Comments Commenced being seen in the [facility] 16-SEP-12 after an acute admission for DVT. 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            ANTICOAG TODAY: Anticoagulation Summary as of 03/15/2011              INR goal 2.0-3.0     Selected INR 3.80! (03/15/2011) Next INR check 04/12/2011   Weekly max dose (mg)  Target end date 07/18/2011   Indications DVT (deep venous thrombosis), Encounter for long-term (current) use of anticoagulants, Primary hypercoagulable state, History of DVT (deep vein thrombosis)    Anticoagulation Episode Summary              [facility] check location [facility]    Send INR reminders to    Comments Commenced being seen in the [facility] 16-SEP-12 after an acute admission for DVT. She has history of DVT. Second unprovoked DVT would suggest for  continued indefinite anticoagulation with periodic reveiw and assessment for continued indication.            PATIENT INSTRUCTIONS: Patient Instructions  Patient instructed to take medications as defined in the Anti-coagulation Track section of this encounter.  Patient instructed to take today's dose.  Patient verbalized understanding of these instructions.        FOLLOW-UP Return for Follow up INR.  Hulen Luster, [name] Pharm.D., CACP

## 2011-03-15 NOTE — Patient Instructions (Signed)
Patient instructed to take medications as defined in the Anti-coagulation Track section of this encounter.  Patient instructed to take today's dose.  Patient verbalized understanding of these instructions.    

## 2011-03-29 ENCOUNTER — Other Ambulatory Visit: Payer: Self-pay | Admitting: *Deleted

## 2011-03-29 MED ORDER — MORPHINE SULFATE 30 MG PO TABS: 30.0000 mg | ORAL_TABLET | Freq: Four times a day (QID) | ORAL | Status: DC | PRN

## 2011-04-05 ENCOUNTER — Encounter: Payer: Self-pay | Admitting: Internal Medicine

## 2011-04-12 ENCOUNTER — Ambulatory Visit (INDEPENDENT_AMBULATORY_CARE_PROVIDER_SITE_OTHER): Payer: Self-pay | Admitting: Pharmacist

## 2011-04-12 DIAGNOSIS — Z86718 Personal history of other venous thrombosis and embolism: Secondary | ICD-10-CM

## 2011-04-12 DIAGNOSIS — I80299 Phlebitis and thrombophlebitis of other deep vessels of unspecified lower extremity: Secondary | ICD-10-CM

## 2011-04-12 DIAGNOSIS — I82409 Acute embolism and thrombosis of unspecified deep veins of unspecified lower extremity: Secondary | ICD-10-CM

## 2011-04-12 DIAGNOSIS — D6859 Other primary thrombophilia: Secondary | ICD-10-CM

## 2011-04-12 DIAGNOSIS — Z7901 Long term (current) use of anticoagulants: Secondary | ICD-10-CM

## 2011-04-12 DIAGNOSIS — I80201 Phlebitis and thrombophlebitis of unspecified deep vessels of right lower extremity: Secondary | ICD-10-CM

## 2011-04-12 LAB — POCT INR: INR: 3.3

## 2011-04-12 NOTE — Progress Notes (Signed)
Anti-Coagulation Progress Note  Marie Willis is a 55 y.o. female who is currently on an anti-coagulation regimen.    RECENT RESULTS: Recent results are below, the most recent result is correlated with a dose of 40 mg. per week: Lab Results  Component Value Date   INR 3.30 04/12/2011   INR 3.80 03/15/2011   INR 2.90 02/22/2011    ANTI-COAG DOSE:   Latest dosing instructions   Total Sun Mon Tue Wed Thu Fri Sat   35 5 mg 5 mg 5 mg 5 mg 5 mg 5 mg 5 mg    (5 mg1) (5 mg1) (5 mg1) (5 mg1) (5 mg1) (5 mg1) (5 mg1)         ANTICOAG SUMMARY: Anticoagulation Episode Summary              Current INR goal 2.0-3.0 Next INR check 05/10/2011   INR from last check 3.30! (04/12/2011)     Weekly max dose (mg)  Target end date 07/18/2011   Indications DVT (deep venous thrombosis), Encounter for long-term (current) use of anticoagulants, Primary hypercoagulable state, History of DVT (deep vein thrombosis)   [facility] location [facility]    Send INR reminders to    Comments Commenced being seen in the [facility] 16-SEP-12 after an acute admission for DVT. 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            ANTICOAG TODAY: Anticoagulation Summary as of 04/12/2011              INR goal 2.0-3.0     Selected INR 3.30! (04/12/2011) Next INR check 05/10/2011   Weekly max dose (mg)  Target end date 07/18/2011   Indications DVT (deep venous thrombosis), Encounter for long-term (current) use of anticoagulants, Primary hypercoagulable state, History of DVT (deep vein thrombosis)    Anticoagulation Episode Summary              [facility] check location [facility]    Send INR reminders to    Comments Commenced being seen in the [facility] 16-SEP-12 after an acute admission for DVT. She has history of DVT. Second unprovoked DVT would suggest for continued  indefinite anticoagulation with periodic reveiw and assessment for continued indication.            PATIENT INSTRUCTIONS: Patient Instructions  Patient instructed to take medications as defined in the Anti-coagulation Track section of this encounter.  Patient instructed to take today's dose.  Patient verbalized understanding of these instructions.        FOLLOW-UP Return in 4 weeks (on 05/10/2011) for Follow up INR.  Hulen Luster, [name] Pharm.D., CACP

## 2011-04-12 NOTE — Patient Instructions (Signed)
Patient instructed to take medications as defined in the Anti-coagulation Track section of this encounter.  Patient instructed to take today's dose.  Patient verbalized understanding of these instructions.    

## 2011-04-22 ENCOUNTER — Other Ambulatory Visit: Payer: Self-pay | Admitting: *Deleted

## 2011-04-22 MED ORDER — MORPHINE SULFATE 30 MG PO TABS: 30.0000 mg | ORAL_TABLET | Freq: Four times a day (QID) | ORAL | Status: DC | PRN

## 2011-04-22 NOTE — Telephone Encounter (Signed)
Pt wants to get on 1-3 as she will be oot. Can you fill and post date?

## 2011-05-10 ENCOUNTER — Ambulatory Visit (INDEPENDENT_AMBULATORY_CARE_PROVIDER_SITE_OTHER): Payer: Self-pay | Admitting: Pharmacist

## 2011-05-10 DIAGNOSIS — Z86718 Personal history of other venous thrombosis and embolism: Secondary | ICD-10-CM

## 2011-05-10 DIAGNOSIS — D6859 Other primary thrombophilia: Secondary | ICD-10-CM

## 2011-05-10 DIAGNOSIS — Z7901 Long term (current) use of anticoagulants: Secondary | ICD-10-CM

## 2011-05-10 DIAGNOSIS — I82409 Acute embolism and thrombosis of unspecified deep veins of unspecified lower extremity: Secondary | ICD-10-CM

## 2011-05-10 DIAGNOSIS — I80201 Phlebitis and thrombophlebitis of unspecified deep vessels of right lower extremity: Secondary | ICD-10-CM

## 2011-05-10 DIAGNOSIS — I80299 Phlebitis and thrombophlebitis of other deep vessels of unspecified lower extremity: Secondary | ICD-10-CM

## 2011-05-10 LAB — POCT INR: INR: 2

## 2011-05-10 NOTE — Progress Notes (Signed)
Anti-Coagulation Progress Note  Monya Kozakiewicz is a 56 y.o. female who is currently on an anti-coagulation regimen.    RECENT RESULTS: Recent results are below, the most recent result is correlated with a dose of 35 mg. per week: Lab Results  Component Value Date   INR 2.0 05/10/2011   INR 3.30 04/12/2011   INR 3.80 03/15/2011    ANTI-COAG DOSE:   Latest dosing instructions   Total Sun Mon Tue Wed Thu Fri Sat   42.5 5 mg 7.5 mg 5 mg 7.5 mg 5 mg 7.5 mg 5 mg    (5 mg1) (5 mg1.5) (5 mg1) (5 mg1.5) (5 mg1) (5 mg1.5) (5 mg1)         ANTICOAG SUMMARY: Anticoagulation Episode Summary              Current INR goal 2.0-3.0 Next INR check 05/31/2011   INR from last check 2.0 (05/10/2011)     Weekly max dose (mg)  Target end date 07/18/2011   Indications DVT (deep venous thrombosis), Encounter for long-term (current) use of anticoagulants, Primary hypercoagulable state, History of DVT (deep vein thrombosis)   [facility] location [facility]    Send INR reminders to    Comments Commenced being seen in the [facility] 16-SEP-12 after an acute admission for DVT. 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            ANTICOAG TODAY: Anticoagulation Summary as of 05/10/2011              INR goal 2.0-3.0     Selected INR 2.0 (05/10/2011) Next INR check 05/31/2011   Weekly max dose (mg)  Target end date 07/18/2011   Indications DVT (deep venous thrombosis), Encounter for long-term (current) use of anticoagulants, Primary hypercoagulable state, History of DVT (deep vein thrombosis)    Anticoagulation Episode Summary              INR check location [facility]    Send INR reminders to    Comments Commenced being seen in the [facility] 16-SEP-12 after an acute admission for DVT. She has history of DVT. Second unprovoked DVT would suggest for continued  indefinite anticoagulation with periodic reveiw and assessment for continued indication.            PATIENT INSTRUCTIONS: Patient Instructions  Patient instructed to take medications as defined in the Anti-coagulation Track section of this encounter.  Patient instructed to take today's dose.  Patient verbalized understanding of these instructions.        FOLLOW-UP Return in 3 weeks (on 05/31/2011) for Follow up INR.  Hulen Luster, [name] Pharm.D., CACP

## 2011-05-10 NOTE — Patient Instructions (Signed)
Patient instructed to take medications as defined in the Anti-coagulation Track section of this encounter.  Patient instructed to take today's dose.  Patient verbalized understanding of these instructions.    

## 2011-05-24 ENCOUNTER — Other Ambulatory Visit (INDEPENDENT_AMBULATORY_CARE_PROVIDER_SITE_OTHER): Payer: Self-pay | Admitting: Surgery

## 2011-05-24 ENCOUNTER — Other Ambulatory Visit: Payer: Self-pay | Admitting: *Deleted

## 2011-05-24 DIAGNOSIS — Z7901 Long term (current) use of anticoagulants: Secondary | ICD-10-CM

## 2011-05-24 DIAGNOSIS — I82409 Acute embolism and thrombosis of unspecified deep veins of unspecified lower extremity: Secondary | ICD-10-CM

## 2011-05-24 DIAGNOSIS — F419 Anxiety disorder, unspecified: Secondary | ICD-10-CM

## 2011-05-24 MED ORDER — DIAZEPAM 5 MG PO TABS: 5.0000 mg | ORAL_TABLET | Freq: Three times a day (TID) | ORAL | Status: DC | PRN

## 2011-05-24 MED ORDER — WARFARIN SODIUM 5 MG PO TABS: ORAL_TABLET | ORAL | Status: DC

## 2011-05-24 NOTE — Telephone Encounter (Signed)
Pt is having to refill valium every 10 days.  Can you give her a month supply at a time?  # 90

## 2011-05-26 ENCOUNTER — Other Ambulatory Visit: Payer: Self-pay | Admitting: *Deleted

## 2011-05-26 NOTE — Telephone Encounter (Signed)
Last refill 12/27 # 90 Also Dr Aundria Rud did refill for pt's valium but the way you have written the Rx pt needs to refill a every 10 days.  Can you rewrite for # 90?

## 2011-05-27 MED ORDER — MORPHINE SULFATE 30 MG PO TABS: 30.0000 mg | ORAL_TABLET | Freq: Four times a day (QID) | ORAL | Status: DC | PRN

## 2011-05-27 NOTE — Telephone Encounter (Signed)
Patient was started by Valium by Dr. Denton Meek for anxiety and help with sleep at night on 02/24/2011. I have not seen the patient since early 2012. I would like to cancel further refills for valium and have her come to the clinic if she is not agreeable.  Thank you  Issiac Jamar

## 2011-05-27 NOTE — Telephone Encounter (Signed)
The way I had written the prescription was because I wanted him to take just 1 a day and was trying to get him off valium and start him on SSRI (last clinic note). Thank you Bronco Mcgrory

## 2011-05-28 NOTE — Telephone Encounter (Signed)
Pt has a scheduled appointment with you on 2/4.  Please take with her about the valium.

## 2011-05-31 ENCOUNTER — Encounter: Payer: Self-pay | Admitting: Internal Medicine

## 2011-05-31 ENCOUNTER — Ambulatory Visit (INDEPENDENT_AMBULATORY_CARE_PROVIDER_SITE_OTHER): Payer: Self-pay | Admitting: Pharmacist

## 2011-05-31 ENCOUNTER — Ambulatory Visit (INDEPENDENT_AMBULATORY_CARE_PROVIDER_SITE_OTHER): Payer: Self-pay | Admitting: Internal Medicine

## 2011-05-31 VITALS — BP 109/69 | HR 75 | Temp 98.2°F | Wt 205.9 lb

## 2011-05-31 DIAGNOSIS — D6859 Other primary thrombophilia: Secondary | ICD-10-CM

## 2011-05-31 DIAGNOSIS — I80201 Phlebitis and thrombophlebitis of unspecified deep vessels of right lower extremity: Secondary | ICD-10-CM

## 2011-05-31 DIAGNOSIS — I82409 Acute embolism and thrombosis of unspecified deep veins of unspecified lower extremity: Secondary | ICD-10-CM

## 2011-05-31 DIAGNOSIS — Z7901 Long term (current) use of anticoagulants: Secondary | ICD-10-CM

## 2011-05-31 DIAGNOSIS — F419 Anxiety disorder, unspecified: Secondary | ICD-10-CM

## 2011-05-31 DIAGNOSIS — F17200 Nicotine dependence, unspecified, uncomplicated: Secondary | ICD-10-CM

## 2011-05-31 DIAGNOSIS — F411 Generalized anxiety disorder: Secondary | ICD-10-CM

## 2011-05-31 DIAGNOSIS — I80299 Phlebitis and thrombophlebitis of other deep vessels of unspecified lower extremity: Secondary | ICD-10-CM

## 2011-05-31 DIAGNOSIS — Z0000 Encounter for general adult medical examination without abnormal findings: Secondary | ICD-10-CM

## 2011-05-31 DIAGNOSIS — M549 Dorsalgia, unspecified: Secondary | ICD-10-CM

## 2011-05-31 DIAGNOSIS — Z86718 Personal history of other venous thrombosis and embolism: Secondary | ICD-10-CM

## 2011-05-31 MED ORDER — DIAZEPAM 5 MG PO TABS: 5.0000 mg | ORAL_TABLET | Freq: Every evening | ORAL | Status: DC | PRN

## 2011-05-31 MED ORDER — BUPROPION HCL ER (SR) 150 MG PO TB12: 150.0000 mg | ORAL_TABLET | Freq: Two times a day (BID) | ORAL | Status: DC

## 2011-05-31 MED ORDER — ASPIRIN EC 81 MG PO TBEC: 81.0000 mg | DELAYED_RELEASE_TABLET | Freq: Every day | ORAL | Status: AC

## 2011-05-31 NOTE — Patient Instructions (Signed)
Smoking Cessation This document explains the best ways for you to quit smoking and new treatments to help. It lists new medicines that can double or triple your chances of quitting and quitting for good. It also considers ways to avoid relapses and concerns you may have about quitting, including weight gain. NICOTINE: A POWERFUL ADDICTION If you have tried to quit smoking, you know how hard it can be. It is hard because nicotine is a very addictive drug. For some people, it can be as addictive as heroin or cocaine. Usually, people make 2 or 3 tries, or more, before finally being able to quit. Each time you try to quit, you can learn about what helps and what hurts. Quitting takes hard work and a lot of effort, but you can quit smoking. QUITTING SMOKING IS ONE OF THE MOST IMPORTANT THINGS YOU WILL EVER DO.  You will live longer, feel better, and live better.   The impact on your body of quitting smoking is felt almost immediately:   Within 20 minutes, blood pressure decreases. Pulse returns to its normal level.   After 8 hours, carbon monoxide levels in the blood return to normal. Oxygen level increases.   After 24 hours, chance of heart attack starts to decrease. Breath, hair, and body stop smelling like smoke.   After 48 hours, damaged nerve endings begin to recover. Sense of taste and smell improve.   After 72 hours, the body is virtually free of nicotine. Bronchial tubes relax and breathing becomes easier.   After 2 to 12 weeks, lungs can hold more air. Exercise becomes easier and circulation improves.   Quitting will reduce your risk of having a heart attack, stroke, cancer, or lung disease:   After 1 year, the risk of coronary heart disease is cut in half.   After 5 years, the risk of stroke falls to the same as a nonsmoker.   After 10 years, the risk of lung cancer is cut in half and the risk of other cancers decreases significantly.   After 15 years, the risk of coronary heart  disease drops, usually to the level of a nonsmoker.   If you are pregnant, quitting smoking will improve your chances of having a healthy baby.   The people you live with, especially your children, will be healthier.   You will have extra money to spend on things other than cigarettes.  FIVE KEYS TO QUITTING Studies have shown that these 5 steps will help you quit smoking and quit for good. You have the best chances of quitting if you use them together: 1. Get ready.  2. Get support and encouragement.  3. Learn new skills and behaviors.  4. Get medicine to reduce your nicotine addiction and use it correctly.  5. Be prepared for relapse or difficult situations. Be determined to continue trying to quit, even if you do not succeed at first.  1. GET READY  Set a quit date.   Change your environment.   Get rid of ALL cigarettes, ashtrays, matches, and lighters in your home, car, and place of work.   Do not let people smoke in your home.   Review your past attempts to quit. Think about what worked and what did not.   Once you quit, do not smoke. NOT EVEN A PUFF!  2. GET SUPPORT AND ENCOURAGEMENT Studies have shown that you have a better chance of being successful if you have help. You can get support in many ways.  Tell   your family, friends, and coworkers that you are going to quit and need their support. Ask them not to smoke around you.   Talk to your caregivers (doctor, dentist, nurse, pharmacist, psychologist, and/or smoking counselor).   Get individual, group, or telephone counseling and support. The more counseling you have, the better your chances are of quitting. Programs are available at local hospitals and health centers. Call your local health department for information about programs in your area.   Spiritual beliefs and practices may help some smokers quit.   Quit meters are small computer programs online or downloadable that keep track of quit statistics, such as amount  of "quit-time," cigarettes not smoked, and money saved.   Many smokers find one or more of the many self-help books available useful in helping them quit and stay off tobacco.  3. LEARN NEW SKILLS AND BEHAVIORS  Try to distract yourself from urges to smoke. Talk to someone, go for a walk, or occupy your time with a task.   When you first try to quit, change your routine. Take a different route to work. Drink tea instead of coffee. Eat breakfast in a different place.   Do something to reduce your stress. Take a hot bath, exercise, or read a book.   Plan something enjoyable to do every day. Reward yourself for not smoking.   Explore interactive web-based programs that specialize in helping you quit.  4. GET MEDICINE AND USE IT CORRECTLY Medicines can help you stop smoking and decrease the urge to smoke. Combining medicine with the above behavioral methods and support can quadruple your chances of successfully quitting smoking. The [place]. Food and Drug Administration (FDA) has approved 7 medicines to help you quit smoking. These medicines fall into 3 categories.  Nicotine replacement therapy (delivers nicotine to your body without the negative effects and risks of smoking):   Nicotine gum: Available over-the-counter.   Nicotine lozenges: Available over-the-counter.   Nicotine inhaler: Available by prescription.   Nicotine nasal spray: Available by prescription.   Nicotine skin patches (transdermal): Available by prescription and over-the-counter.   Antidepressant medicine (helps people abstain from smoking, but how this works is unknown):   Bupropion sustained-release (SR) tablets: Available by prescription.   Nicotinic receptor partial agonist (simulates the effect of nicotine in your brain):   Varenicline tartrate tablets: Available by prescription.   Ask your caregiver for advice about which medicines to use and how to use them. Carefully read the information on the package.    Everyone who is trying to quit may benefit from using a medicine. If you are pregnant or trying to become pregnant, nursing an infant, you are under age 18, or you smoke fewer than 10 cigarettes per day, talk to your caregiver before taking any nicotine replacement medicines.   You should stop using a nicotine replacement product and call your caregiver if you experience nausea, dizziness, weakness, vomiting, fast or irregular heartbeat, mouth problems with the lozenge or gum, or redness or swelling of the skin around the patch that does not go away.   Do not use any other product containing nicotine while using a nicotine replacement product.   Talk to your caregiver before using these products if you have diabetes, heart disease, asthma, stomach ulcers, you had a recent heart attack, you have high blood pressure that is not controlled with medicine, a history of irregular heartbeat, or you have been prescribed medicine to help you quit smoking.  5. BE PREPARED FOR RELAPSE OR   DIFFICULT SITUATIONS  Most relapses occur within the first 3 months after quitting. Do not be discouraged if you start smoking again. Remember, most people try several times before they finally quit.   You may have symptoms of withdrawal because your body is used to nicotine. You may crave cigarettes, be irritable, feel very hungry, cough often, get headaches, or have difficulty concentrating.   The withdrawal symptoms are only temporary. They are strongest when you first quit, but they will go away within 10 to 14 days.  Here are some difficult situations to watch for:  Alcohol. Avoid drinking alcohol. Drinking lowers your chances of successfully quitting.   Caffeine. Try to reduce the amount of caffeine you consume. It also lowers your chances of successfully quitting.   Other smokers. Being around smoking can make you want to smoke. Avoid smokers.   Weight gain. Many smokers will gain weight when they quit, usually  less than 10 pounds. Eat a healthy diet and stay active. Do not let weight gain distract you from your main goal, quitting smoking. Some medicines that help you quit smoking may also help delay weight gain. You can always lose the weight gained after you quit.   Bad mood or depression. There are a lot of ways to improve your mood other than smoking.  If you are having problems with any of these situations, talk to your caregiver. SPECIAL SITUATIONS AND CONDITIONS Studies suggest that everyone can quit smoking. Your situation or condition can give you a special reason to quit.  Pregnant women/new mothers: By quitting, you protect your baby's health and your own.   Hospitalized patients: By quitting, you reduce health problems and help healing.   Heart attack patients: By quitting, you reduce your risk of a second heart attack.   Lung, head, and neck cancer patients: By quitting, you reduce your chance of a second cancer.   Parents of children and adolescents: By quitting, you protect your children from illnesses caused by secondhand smoke.  QUESTIONS TO THINK ABOUT Think about the following questions before you try to stop smoking. You may want to talk about your answers with your caregiver.  Why do you want to quit?   If you tried to quit in the past, what helped and what did not?   What will be the most difficult situations for you after you quit? How will you plan to handle them?   Who can help you through the tough times? Your family? Friends? Caregiver?   What pleasures do you get from smoking? What ways can you still get pleasure if you quit?  Here are some questions to ask your caregiver:  How can you help me to be successful at quitting?   What medicine do you think would be best for me and how should I take it?   What should I do if I need more help?   What is smoking withdrawal like? How can I get information on withdrawal?  Quitting takes hard work and a lot of effort,  but you can quit smoking. FOR MORE INFORMATION  Smokefree.gov (http://www.smokefree.gov) provides free, accurate, evidence-based information and professional assistance to help support the immediate and long-term needs of people trying to quit smoking. Document Released: 04/06/2001 Document Revised: 12/23/2010 Document Reviewed: 01/27/2009 Exit[employer] Patient Information 2012 [employer]. 

## 2011-05-31 NOTE — Progress Notes (Signed)
Anti-Coagulation Progress Note  Marie Willis is a 56 y.o. female who is currently on an anti-coagulation regimen.    RECENT RESULTS: Recent results are below, the most recent result is correlated with a dose of 42.5 mg. per week: Lab Results  Component Value Date   INR 2.40 05/31/2011   INR 2.0 05/10/2011   INR 3.30 04/12/2011    ANTI-COAG DOSE:   Latest dosing instructions   Total Sun Mon Tue Wed Thu Fri Sat   42.5 5 mg 7.5 mg 5 mg 7.5 mg 5 mg 7.5 mg 5 mg    (5 mg1) (5 mg1.5) (5 mg1) (5 mg1.5) (5 mg1) (5 mg1.5) (5 mg1)         ANTICOAG SUMMARY: Anticoagulation Episode Summary              Current INR goal 2.0-3.0 Next INR check 05/31/2011   INR from last check 2.0 (05/10/2011) Most recent INR 2.40 (05/31/2011)   Weekly max dose (mg)  Target end date 07/18/2011   Indications DVT (deep venous thrombosis), Encounter for long-term (current) use of anticoagulants, Primary hypercoagulable state, History of DVT (deep vein thrombosis)   INR check location [facility] Preferred lab    Send INR reminders to    Comments Commenced being seen in the [facility] 16-SEP[facility]12 after an acute admission for DVT. 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            ANTICOAG TODAY: Anticoagulation Summary as of 05/31/2011              INR goal 2.0-3.0     Selected INR  Next INR check    Weekly max dose (mg)  Target end date 07/18/2011   Indications DVT (deep venous thrombosis), Encounter for long-term (current) use of anticoagulants, Primary hypercoagulable state, History of DVT (deep vein thrombosis)    Anticoagulation Episode Summary              INR check location [facility] Preferred lab    Send INR reminders to    Comments Commenced being seen in the [facility] 16[facility]SEP[facility]12 after an acute admission for DVT. She has history of DVT. Second unprovoked DVT would suggest for  continued indefinite anticoagulation with periodic reveiw and assessment for continued indication.            PATIENT INSTRUCTIONS: Patient Instructions  Patient instructed to take medications as defined in the Anti-coagulation Track section of this encounter.  Patient instructed to 42.5 today's dose.  Patient verbalized understanding of these instructions.        FOLLOW-UP Return in 3 weeks (on 06/21/2011) for Follow up INR .  Hulen Luster, [name] Pharm.D., CACP

## 2011-05-31 NOTE — Progress Notes (Signed)
  Subjective:    Patient ID: Marie Willis, female    DOB: 1955-10-03, 56 y.o.   MRN: [id number]  HPI  Marie Willis is a 56 year old female patient of mine who is here today to discuss her anxiety medications.  She has been taking Valium which was prescribed by Dr. Denton Meek back in September and takes one tablet every day as needed for anxiety. Patient states that her tobacco use is related to her anxiety medications. She usually goes up on smoking when anxiety medications are cut down.  She needs a Pap smear. She needs colonoscopy. She needs a mammogram. Patient was given appointment with GI physician and a mammogram appointment. Patient refused to undergo Pap smear.  No other complaints at this time.  The patient is on chronic pain medications for her back pain. She is under a pain contract.     Review of Systems  Constitutional: Negative for fever, activity change and appetite change.  HENT: Negative for sore throat.   Respiratory: Negative for cough and shortness of breath.   Cardiovascular: Negative for chest pain and leg swelling.  Gastrointestinal: Negative for nausea, abdominal pain, diarrhea, constipation and abdominal distention.  Genitourinary: Negative for frequency, hematuria and difficulty urinating.  Neurological: Negative for dizziness and headaches.  Psychiatric/Behavioral: Negative for suicidal ideas and behavioral problems.       Objective:   Physical Exam  Constitutional: She is oriented to person, place, and time. She appears well-developed and well-nourished.  HENT:  Head: Normocephalic and atraumatic.  Eyes: Conjunctivae and EOM are normal. Pupils are equal, round, and reactive to light. No scleral icterus.  Neck: Normal range of motion. Neck supple. No JVD present. No thyromegaly present.  Cardiovascular: Normal rate, regular rhythm, normal heart sounds and intact distal pulses.  Exam reveals no gallop and no friction rub.   No murmur  heard. Pulmonary/Chest: Effort normal and breath sounds normal. No respiratory distress. She has no wheezes. She has no rales.  Abdominal: Soft. Bowel sounds are normal. She exhibits no distension and no mass. There is no tenderness. There is no rebound and no guarding.  Musculoskeletal: Normal range of motion. She exhibits no edema and no tenderness.  Lymphadenopathy:    She has no cervical adenopathy.  Neurological: She is alert and oriented to person, place, and time.  Psychiatric: She has a normal mood and affect. Her behavior is normal.          Assessment & Plan:

## 2011-05-31 NOTE — Patient Instructions (Signed)
Patient instructed to take medications as defined in the Anti-coagulation Track section of this encounter.  Patient instructed to 42.5 today's dose.  Patient verbalized understanding of these instructions.

## 2011-05-31 NOTE — Assessment & Plan Note (Signed)
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 I will start her on aspirin 81 mg daily based on the latest article published in NEJM regarding long term benefit on these patients.

## 2011-06-01 NOTE — Assessment & Plan Note (Addendum)
We decided to try Wellbutrin 150 mg twice a day which has worked for her in the past. We have decided not to prescribe her any more Valium and will try to taper down in next one month. Patient followup in one month.

## 2011-06-03 NOTE — Assessment & Plan Note (Signed)
Patient counseled appropriately but she denies quitting at this time.

## 2011-06-03 NOTE — Assessment & Plan Note (Signed)
Please see history of present illness for details

## 2011-06-03 NOTE — Assessment & Plan Note (Signed)
Patient is currently taking morphine 30 mg IR one tablet 3 times a day

## 2011-06-21 ENCOUNTER — Ambulatory Visit: Payer: Self-pay

## 2011-06-23 ENCOUNTER — Other Ambulatory Visit: Payer: Self-pay | Admitting: *Deleted

## 2011-06-23 NOTE — Telephone Encounter (Signed)
Last refill (#130)

## 2011-06-25 ENCOUNTER — Telehealth: Payer: Self-pay | Admitting: *Deleted

## 2011-06-25 MED ORDER — MORPHINE SULFATE 30 MG PO TABS: 30.0000 mg | ORAL_TABLET | Freq: Four times a day (QID) | ORAL | Status: DC | PRN

## 2011-06-25 NOTE — Telephone Encounter (Signed)
Rx ready for pick up. 

## 2011-06-25 NOTE — Telephone Encounter (Signed)
Pt called and states she can not afford Wellbutrin.  The cheapest she can find is $160 a month. Can you change to something else ?

## 2011-06-25 NOTE — Telephone Encounter (Signed)
[facility] has 90 pills of bupropion hcl 150 mg 12hr ER tab for 12 bucks. Please call in the prescription at pharmacy of her choice. 1 tab BID.  Thank you,  Lequita Meadowcroft

## 2011-07-19 ENCOUNTER — Encounter: Payer: Self-pay | Admitting: Internal Medicine

## 2011-07-20 ENCOUNTER — Other Ambulatory Visit: Payer: Self-pay | Admitting: *Deleted

## 2011-07-20 MED ORDER — MORPHINE SULFATE 30 MG PO TABS: 30.0000 mg | ORAL_TABLET | Freq: Four times a day (QID) | ORAL | Status: DC | PRN

## 2011-07-21 NOTE — Telephone Encounter (Signed)
Message left this am- rx ready for pick up. 

## 2011-08-08 ENCOUNTER — Other Ambulatory Visit: Payer: Self-pay | Admitting: Internal Medicine

## 2011-08-19 ENCOUNTER — Telehealth: Payer: Self-pay | Admitting: *Deleted

## 2011-08-19 NOTE — Telephone Encounter (Signed)
No show letter sent.

## 2011-08-19 NOTE — Telephone Encounter (Signed)
Left message for pt to [facility] previsit by 5 PM today.  Otherwise I will send a No show letter and explained she would have to [facility] both the PV and [facility]

## 2011-08-20 ENCOUNTER — Other Ambulatory Visit: Payer: Self-pay | Admitting: *Deleted

## 2011-08-20 NOTE — Telephone Encounter (Signed)
Last refill 3/27 Pt will get on Tuesday

## 2011-08-23 MED ORDER — MORPHINE SULFATE 30 MG PO TABS: 30.0000 mg | ORAL_TABLET | Freq: Four times a day (QID) | ORAL | Status: DC | PRN

## 2011-09-02 ENCOUNTER — Encounter: Payer: Self-pay | Admitting: Internal Medicine

## 2011-09-02 NOTE — Progress Notes (Signed)
Addended by: Neomia Dear on: 09/02/2011 06:25 PM   Modules accepted: Orders

## 2011-09-21 ENCOUNTER — Other Ambulatory Visit: Payer: Self-pay | Admitting: *Deleted

## 2011-09-22 MED ORDER — MORPHINE SULFATE 30 MG PO TABS: 30.0000 mg | ORAL_TABLET | Freq: Four times a day (QID) | ORAL | Status: DC | PRN

## 2011-09-22 NOTE — Telephone Encounter (Signed)
Pt aware Rx ready for pick up 

## 2011-10-14 ENCOUNTER — Encounter: Payer: Self-pay | Admitting: Internal Medicine

## 2011-10-22 ENCOUNTER — Ambulatory Visit (INDEPENDENT_AMBULATORY_CARE_PROVIDER_SITE_OTHER): Payer: Self-pay | Admitting: Internal Medicine

## 2011-10-22 ENCOUNTER — Encounter: Payer: Self-pay | Admitting: Internal Medicine

## 2011-10-22 VITALS — BP 128/74 | HR 67 | Temp 98.9°F | Ht 66.0 in | Wt 213.2 lb

## 2011-10-22 DIAGNOSIS — G8929 Other chronic pain: Secondary | ICD-10-CM

## 2011-10-22 DIAGNOSIS — Z1211 Encounter for screening for malignant neoplasm of colon: Secondary | ICD-10-CM

## 2011-10-22 DIAGNOSIS — F411 Generalized anxiety disorder: Secondary | ICD-10-CM

## 2011-10-22 DIAGNOSIS — M549 Dorsalgia, unspecified: Secondary | ICD-10-CM

## 2011-10-22 DIAGNOSIS — R0602 Shortness of breath: Secondary | ICD-10-CM

## 2011-10-22 DIAGNOSIS — I82409 Acute embolism and thrombosis of unspecified deep veins of unspecified lower extremity: Secondary | ICD-10-CM

## 2011-10-22 MED ORDER — MORPHINE SULFATE 30 MG PO TABS: 30.0000 mg | ORAL_TABLET | Freq: Four times a day (QID) | ORAL | Status: DC | PRN

## 2011-10-22 NOTE — Progress Notes (Signed)
Hemoccult cards given/explained to pt.

## 2011-10-22 NOTE — Patient Instructions (Signed)
Please do steam inhalations 2-3 times a day. Salt water gargles 2-3 times a day. Please take zyrtec as instructed over the counter.

## 2011-10-23 LAB — PRESCRIPTION ABUSE MONITORING 15P, URINE
Cannabinoid Scrn, Ur: NEGATIVE ng/mL
Cocaine Metabolites: NEGATIVE ng/mL
Creatinine, Urine: 86.68 mg/dL (ref >20.0)
Fentanyl, Ur: NEGATIVE ng/mL
Methadone Screen, Urine: NEGATIVE ng/mL
Opiate Screen, Urine: POSITIVE ng/mL — ABNORMAL HIGH
Oxycodone Screen, Ur: NEGATIVE ng/mL
Propoxyphene: NEGATIVE ng/mL
Zolpidem, Urine: NEGATIVE ng/mL

## 2011-10-24 NOTE — Assessment & Plan Note (Signed)
This is most likely 2/2 allergic process as she has similar shortness of breathe very season. Other differentials include progressive obstructive disease. No chest pain noted at this time. Patient's physical exam does not elicit wheezing.  -avoid smoking and try to work on quitting, counseled today. -PFT's in future -Steam inhalation and OTC claritin -Follow up as needed.

## 2011-10-24 NOTE — Assessment & Plan Note (Signed)
Patient not on warfarin any more as she completed >6 months of anticoagulation.

## 2011-10-24 NOTE — Assessment & Plan Note (Signed)
Patient did not start wellbutrin which was prescribed at last office visit.  She does not want to start anything at this time.

## 2011-10-24 NOTE — Progress Notes (Signed)
  Subjective:    Patient ID: Marie Willis, female    DOB: 1955-12-01, 56 y.o.   MRN: [id number]  HPI Patient is a 56 year old women with PMH most significant for depression, anxiety, chronic back pain and hypertension.  Patient is here today for medication refill.  She complains of increased shortness of breath since last few weeks. No fever, chills noted. No cough noted. She still smokes about 1/3rd of a pack a day and does not think that smoking is associated with her progressive shortness of breath.  No other complaints.   Review of Systems  Constitutional: Negative for fever, activity change and appetite change.  HENT: Negative for sore throat.   Respiratory: Positive for shortness of breath. Negative for cough, wheezing and stridor.   Cardiovascular: Negative for chest pain and leg swelling.  Gastrointestinal: Negative for nausea, abdominal pain, diarrhea, constipation and abdominal distention.  Genitourinary: Negative for frequency, hematuria and difficulty urinating.  Neurological: Negative for dizziness and headaches.  Psychiatric/Behavioral: Negative for suicidal ideas and behavioral problems.       Objective:   Physical Exam  Constitutional: She is oriented to person, place, and time. She appears well-developed and well-nourished.  HENT:  Head: Normocephalic and atraumatic.  Eyes: Conjunctivae and EOM are normal. Pupils are equal, round, and reactive to light. No scleral icterus.  Neck: Normal range of motion. Neck supple. No JVD present. No thyromegaly present.  Cardiovascular: Normal rate, regular rhythm, normal heart sounds and intact distal pulses.  Exam reveals no gallop and no friction rub.   No murmur heard. Pulmonary/Chest: Effort normal and breath sounds normal. No respiratory distress. She has no wheezes. She has no rales.  Abdominal: Soft. Bowel sounds are normal. She exhibits no distension and no mass. There is no tenderness. There is no rebound and no  guarding.  Musculoskeletal: Normal range of motion. She exhibits no edema and no tenderness.  Lymphadenopathy:    She has no cervical adenopathy.  Neurological: She is alert and oriented to person, place, and time.  Psychiatric: She has a normal mood and affect. Her behavior is normal.          Assessment & Plan:

## 2011-10-24 NOTE — Assessment & Plan Note (Signed)
Morphine prescribed today. UDS done today.

## 2011-10-27 LAB — OPIATES/OPIOIDS (LC/MS-MS)
Heroin (6-AM), UR: NEGATIVE NG/ML
Hydrocodone: NEGATIVE NG/ML
Hydromorphone: 422 NG/ML — ABNORMAL HIGH
Oxymorphone: NEGATIVE NG/ML

## 2011-11-07 ENCOUNTER — Encounter (HOSPITAL_COMMUNITY): Payer: Self-pay | Admitting: *Deleted

## 2011-11-07 ENCOUNTER — Emergency Department (INDEPENDENT_AMBULATORY_CARE_PROVIDER_SITE_OTHER)
Admission: EM | Admit: 2011-11-07 | Discharge: 2011-11-07 | Disposition: A | Discharge status: Home / Self Care | Payer: Self-pay | Source: Home / Self Care | Attending: Emergency Medicine | Admitting: Emergency Medicine

## 2011-11-07 DIAGNOSIS — K047 Periapical abscess without sinus: Secondary | ICD-10-CM

## 2011-11-07 MED ORDER — DICLOFENAC SODIUM 75 MG PO TBEC: 75.0000 mg | DELAYED_RELEASE_TABLET | Freq: Two times a day (BID) | ORAL | Status: AC

## 2011-11-07 MED ORDER — METRONIDAZOLE 500 MG PO TABS: 500.0000 mg | ORAL_TABLET | Freq: Three times a day (TID) | ORAL | Status: AC

## 2011-11-07 NOTE — ED Notes (Signed)
Co left lower jaw, gum pain x 1 day, jaw has been swollen x 4-5 days

## 2011-11-07 NOTE — ED Provider Notes (Signed)
Chief Complaint  Patient presents with  . Dental Pain    History of Present Illness:   Marie Willis is a 56 year old female who has pain in her left lower jaw, a lump externally, swelling of the gums and tenderness the gingiva. It hurts to chew on that side. She's had an multiple dental problems for years. She denies any difficulty swallowing or breathing. No fever or chills. No headache, eye pain, or facial pain. No coughing, wheezing, or shortness of breath.  Review of Systems:  Other than noted above, the patient denies any of the following symptoms: Systemic:  No fever, chills, sweats or weight loss. ENT:  No headache, ear ache, sore throat, nasal congestion, facial pain, or swelling. Lymphatic:  No adenopathy. Lungs:  No coughing, wheezing or shortness of breath.  PMFSH:  Past medical history, family history, social history, meds, and allergies were reviewed.  Physical Exam:   Vital signs:  BP 107/70  Pulse 71  Temp 98.8 F (37.1 C) (Oral)  Resp 18  SpO2 99% General:  Alert, oriented, in no distress. ENT:  TMs and canals normal.  Nasal mucosa normal. Mouth exam:  Teeth are in extremely poor repair with all of her teeth decayed down to the ground level. There is some tenderness to palpation in the left lower dentition, although it's hard to say which of the teeth is giving her problems are also severely decayed. Also extremity is a little bit of swelling and tenderness to palpation as well. There is no swelling of the floor the mouth and the pharynx is clear. Neck:  No swelling or adenopathy. Lungs:  Breath sounds clear and equal bilaterally.  No wheezes, rales or rhonchi. Heart:  Regular rhythm.  No gallops or murmers. Skin:  Clear, warm and dry.  Assessment:  The encounter diagnosis was Dental abscess.  Plan:   1.  The following meds were prescribed:   New Prescriptions   DICLOFENAC (VOLTAREN) 75 MG EC TABLET    Take 1 tablet (75 mg total) by mouth 2 (two) times daily.   METRONIDAZOLE (FLAGYL) 500 MG TABLET    Take 1 tablet (500 mg total) by mouth 3 (three) times daily.   2.  The patient was instructed in symptomatic care and handouts were given. 3.  The patient was told to return if becoming worse in any way, if no better in 3 or 4 days, and given some red flag symptoms that would indicate earlier return, especially difficulty breathing. 4.  The patient was told to follow up with a dentist as soon as possible.    Reuben Likes, MD 11/07/11 [phone]

## 2011-11-19 ENCOUNTER — Other Ambulatory Visit: Payer: Self-pay | Admitting: *Deleted

## 2011-11-19 DIAGNOSIS — M549 Dorsalgia, unspecified: Secondary | ICD-10-CM

## 2011-11-19 MED ORDER — MORPHINE SULFATE 30 MG PO TABS: 30.0000 mg | ORAL_TABLET | Freq: Four times a day (QID) | ORAL | Status: DC | PRN

## 2011-11-19 NOTE — Telephone Encounter (Signed)
Pt stated she called Wed for refill.

## 2011-11-19 NOTE — Telephone Encounter (Signed)
Rx given to pt. 

## 2011-12-15 ENCOUNTER — Other Ambulatory Visit: Payer: Self-pay | Admitting: *Deleted

## 2011-12-15 DIAGNOSIS — M549 Dorsalgia, unspecified: Secondary | ICD-10-CM

## 2011-12-15 NOTE — Telephone Encounter (Signed)
Pt will be in clinic on 8/23 and wants to get Rx a little early.  Can you post date? meds last filled 7/26

## 2011-12-16 MED ORDER — MORPHINE SULFATE 30 MG PO TABS: 30.0000 mg | ORAL_TABLET | Freq: Four times a day (QID) | ORAL | Status: DC | PRN

## 2012-01-12 ENCOUNTER — Other Ambulatory Visit: Payer: Self-pay | Admitting: *Deleted

## 2012-01-12 DIAGNOSIS — M549 Dorsalgia, unspecified: Secondary | ICD-10-CM

## 2012-01-13 MED ORDER — MORPHINE SULFATE 30 MG PO TABS: 30.0000 mg | ORAL_TABLET | Freq: Four times a day (QID) | ORAL | Status: DC | PRN

## 2012-01-13 NOTE — Telephone Encounter (Signed)
Aware Rx is ready. 

## 2012-02-09 ENCOUNTER — Other Ambulatory Visit: Payer: Self-pay | Admitting: *Deleted

## 2012-02-09 DIAGNOSIS — M549 Dorsalgia, unspecified: Secondary | ICD-10-CM

## 2012-02-11 MED ORDER — MORPHINE SULFATE 30 MG PO TABS: 30.0000 mg | ORAL_TABLET | Freq: Four times a day (QID) | ORAL | Status: DC | PRN

## 2012-02-11 NOTE — Telephone Encounter (Signed)
Pt informed Rx is ready 

## 2012-03-10 ENCOUNTER — Other Ambulatory Visit: Payer: Self-pay | Admitting: *Deleted

## 2012-03-10 DIAGNOSIS — M549 Dorsalgia, unspecified: Secondary | ICD-10-CM

## 2012-03-10 MED ORDER — MORPHINE SULFATE 30 MG PO TABS: 30.0000 mg | ORAL_TABLET | Freq: Four times a day (QID) | ORAL | Status: DC | PRN

## 2012-03-10 NOTE — Telephone Encounter (Signed)
Rx ready to be picked up - pt called today.

## 2012-04-10 ENCOUNTER — Other Ambulatory Visit: Payer: Self-pay | Admitting: *Deleted

## 2012-04-10 DIAGNOSIS — M549 Dorsalgia, unspecified: Secondary | ICD-10-CM

## 2012-04-11 MED ORDER — MORPHINE SULFATE 30 MG PO TABS: 30.0000 mg | ORAL_TABLET | Freq: Four times a day (QID) | ORAL | Status: DC | PRN

## 2012-05-09 ENCOUNTER — Other Ambulatory Visit: Payer: Self-pay | Admitting: *Deleted

## 2012-05-09 DIAGNOSIS — M549 Dorsalgia, unspecified: Secondary | ICD-10-CM

## 2012-05-10 MED ORDER — MORPHINE SULFATE 30 MG PO TABS: 30.0000 mg | ORAL_TABLET | Freq: Four times a day (QID) | ORAL | Status: DC | PRN

## 2012-05-10 NOTE — Telephone Encounter (Signed)
Pt seen June and was to have 3 month f/U but no appt given. Pls sch appt ASAP with PCP. Will fill one month. LAst UDS last appt appropriate.

## 2012-06-07 ENCOUNTER — Other Ambulatory Visit: Payer: Self-pay | Admitting: *Deleted

## 2012-06-07 DIAGNOSIS — M549 Dorsalgia, unspecified: Secondary | ICD-10-CM

## 2012-06-07 MED ORDER — MORPHINE SULFATE 30 MG PO TABS: 30.0000 mg | ORAL_TABLET | Freq: Four times a day (QID) | ORAL | Status: DC | PRN

## 2012-06-07 NOTE — Telephone Encounter (Signed)
Message left on mobile phone Rx ready.

## 2012-07-05 ENCOUNTER — Other Ambulatory Visit: Payer: Self-pay | Admitting: *Deleted

## 2012-07-05 DIAGNOSIS — M549 Dorsalgia, unspecified: Secondary | ICD-10-CM

## 2012-07-05 NOTE — Telephone Encounter (Signed)
Last refill written 2/14  Pt will get on Monday 2/17

## 2012-07-06 MED ORDER — MORPHINE SULFATE 30 MG PO TABS: 30.0000 mg | ORAL_TABLET | Freq: Four times a day (QID) | ORAL | Status: DC | PRN

## 2012-08-07 ENCOUNTER — Other Ambulatory Visit: Payer: Self-pay | Admitting: *Deleted

## 2012-08-07 DIAGNOSIS — M549 Dorsalgia, unspecified: Secondary | ICD-10-CM

## 2012-08-09 MED ORDER — MORPHINE SULFATE 30 MG PO TABS: 30.0000 mg | ORAL_TABLET | Freq: Four times a day (QID) | ORAL | Status: DC | PRN

## 2012-09-06 ENCOUNTER — Other Ambulatory Visit: Payer: Self-pay | Admitting: *Deleted

## 2012-09-06 DIAGNOSIS — M549 Dorsalgia, unspecified: Secondary | ICD-10-CM

## 2012-09-06 NOTE — Telephone Encounter (Signed)
Pt would like to pick up rx Friday. Thanks

## 2012-09-07 MED ORDER — MORPHINE SULFATE 30 MG PO TABS: 30.0000 mg | ORAL_TABLET | Freq: Four times a day (QID) | ORAL | Status: DC | PRN

## 2012-09-07 NOTE — Telephone Encounter (Signed)
Morphine refilled until 12/08/2012

## 2012-09-07 NOTE — Telephone Encounter (Signed)
Rxs are ready for pt and husband - pt called and left message [phone]).

## 2012-10-05 ENCOUNTER — Telehealth: Payer: Self-pay | Admitting: *Deleted

## 2012-10-05 ENCOUNTER — Ambulatory Visit (INDEPENDENT_AMBULATORY_CARE_PROVIDER_SITE_OTHER): Payer: Self-pay | Admitting: Internal Medicine

## 2012-10-05 VITALS — BP 122/80 | HR 75 | Temp 98.4°F | Wt 221.1 lb

## 2012-10-05 DIAGNOSIS — M25569 Pain in unspecified knee: Secondary | ICD-10-CM | POA: Insufficient documentation

## 2012-10-05 DIAGNOSIS — K047 Periapical abscess without sinus: Secondary | ICD-10-CM

## 2012-10-05 DIAGNOSIS — Z8719 Personal history of other diseases of the digestive system: Secondary | ICD-10-CM

## 2012-10-05 DIAGNOSIS — K089 Disorder of teeth and supporting structures, unspecified: Secondary | ICD-10-CM

## 2012-10-05 DIAGNOSIS — K0889 Other specified disorders of teeth and supporting structures: Secondary | ICD-10-CM

## 2012-10-05 HISTORY — DX: Personal history of other diseases of the digestive system: Z87.19

## 2012-10-05 LAB — COMPLETE METABOLIC PANEL WITH GFR
AST: 17 U/L (ref 0–37)
Albumin: 4.6 g/dL (ref 3.5–5.2)
Alkaline Phosphatase: 99 U/L (ref 39–117)
BUN: 13 mg/dL (ref 6–23)
Potassium: 4.4 mEq/L (ref 3.5–5.3)
Sodium: 138 mEq/L (ref 135–145)

## 2012-10-05 MED ORDER — CLINDAMYCIN HCL 300 MG PO CAPS: 300.0000 mg | ORAL_CAPSULE | Freq: Three times a day (TID) | ORAL | Status: DC

## 2012-10-05 MED ORDER — METRONIDAZOLE 500 MG PO TABS: 500.0000 mg | ORAL_TABLET | Freq: Three times a day (TID) | ORAL | Status: AC

## 2012-10-05 NOTE — Progress Notes (Signed)
Subjective:     Patient ID: Marie Willis, female   DOB: Jan 21, 1956, 57 y.o.   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 Dental Pain - Right lower molar, started on Sunday, with appreciable swelling which has gone down some. She denies fever or chills, or headaches. She has history of extremely bad dentition with teeth fillings started as young as 57 years of age, recurrent abscesses with the last treatment in 2013, and right now, she has very few teeth standing.  2. Right leg pain - Since past 2 months approximately, dull, throbbing quality of pain, more on walking, getting better with rest. No appreciable swelling. The pain is similar to the DVT that she had before, however, milder. She had stopped taking coumadin per physician instruction in January 2014.  Denies shortness of breath, palpitations, cardiac symptoms.   Review of Systems As per HPI    Objective:   Physical Exam  Constitutional: She is oriented to person, place, and time. She appears well-developed and well-nourished. No distress.  Obese  HENT:  Head: Normocephalic and atraumatic.  Eyes: Conjunctivae and EOM are normal. Pupils are equal, round, and reactive to light.  Cardiovascular: Normal rate, regular rhythm and normal heart sounds.   No murmur heard. Pulmonary/Chest: Effort normal and breath sounds normal.  Musculoskeletal: She exhibits tenderness. She exhibits no edema.  Right leg circumference not significantly different from left leg. No skin changes on right leg. Tenderness over medial right calf area, a possible knot felt on the medial side of calf. Good dorsalis pedia pulses bilaterally. Warm overlying skin both extremities.  Lymphadenopathy:    She has no cervical adenopathy.  Neurological: She is alert and oriented to person, place, and time.  Skin: Skin is warm.       Assessment & Plan:     The acute  issues are as follows:   Dental Abscess - Clindamycin 300 mg TID and Metronidazole 500mg  TID prescribed for 10 days. NSAIDs for pain. Dentistry referral given, however, the patient does not have [employer], so it will not work. The patient asked to speed up her paperwork and get an orange card as soon as possible.   Right leg pain - Because of prior history, I will rule out DVT first. Duplex USG ordered. Patient already has morphine prescription for pain control. Patient advised to get the scan today, however, she wants to get it done tomorrow.   CMP done today to get baseline liver and kidney function.    Further course of action based on ultrasound results. Will inform patient.

## 2012-10-05 NOTE — Telephone Encounter (Signed)
Call from pt stating she pays out of pocket for meds and unable to afford the clindamycin we sent into her pharmacy.  She can afford the flagyl, but wanted to know if you can call in a cheaper antibiotic.  She also wants to know if we can send in the drops for her ear, will forward message to MD for review.  Please advise.Marie Spittle Cassady6/12/20143:25 PM

## 2012-10-05 NOTE — Assessment & Plan Note (Signed)
History of poor dentition and multiple dental abscesses. The patient last had a treated abscess in July 2013. Current pain and swelling is improving per patient, but exam and history are concerning for persistent infection. - Clindamycin and metronidazole for aerobic and anaerobic coverage of oral microbes. - Patient will continue ibuprofen for symptomatic relief. - Will obtain a BMP today to evaluate baseline renal function (last BMP in 2012).

## 2012-10-05 NOTE — Telephone Encounter (Signed)
Clindamycin 300 mg tabs costs [facility] $30 [facility] $76.27 [facility] $96.98 which goes down to $47.14 w/discount card  MD printed off discount card from online and

## 2012-10-05 NOTE — Patient Instructions (Addendum)
You were seen in clinic today for right leg pain and right jaw pain. For your right leg pain, we have order a doppler ultrasound that will allow [place] to see a picture of the veins in your leg. You have indicated that it is easier for you to get this study done tomorrow, so please make sure that you follow up with the appointment for this procedure. You will be contacted with the results of this procedure. For your right jaw and dental pain, we will prescribe you two antibiotics called clindamycin and metronidazole. Make sure that you complete the entire course of these antibiotics and do not stop them early, even if you start to feel better. You may continue to use ibuprofen to relieve the pain and swelling in your jaw.  If your jaw pain becomes worse or you have trouble swallowing or breathing, please seek medical attention immediately. Also seek immediately medical attention for worsening leg pain, developing redness in warmth in your leg, or sudden onset shortness of breath. If your problems do not appear to be getting better in a week, please make an appointment to come see [place] again.  Deep Vein Thrombosis A deep vein thrombosis (DVT) is a blood clot that develops in a deep vein. A DVT is a clot in the deep, larger veins of the leg, arm, or pelvis. These are more dangerous than clots that might form in veins near the surface of the body. A DVT can lead to complications if the clot breaks off and travels in the bloodstream to the lungs.  A DVT can damage the valves in your leg veins, so that instead of flowing upwards, the blood pools in the lower leg. This is called post-thrombotic syndrome, and can result in pain, swelling, discoloration, and sores on the leg. Once identified, a DVT can be treated. It can also be prevented in some circumstances. Once you have had a DVT, you may be at increased risk for a DVT in the future. CAUSES Blood clots form in a vein for different reasons. Usually several  things contribute to blood clots. Contributing factors include:  The flow of blood slows down.  The inside of the vein is damaged in some way.  The person has a condition that makes blood clot more easily. Some people are more likely than others to develop blood clots. That is because they have more factors that make clots likely. These are called risk factors. Risk factors include:   Older age, especially over 52 years old.  Having a history of blood clots. This means you have had one before. Or, it means that someone else in your family has had blood clots. You may have a genetic tendency to form clots.  Having major or lengthy surgery. This is especially true for surgery on the hip, knee, or belly (abdomen). Hip surgery is particularly high risk.  Breaking a hip or leg.  Sitting or lying still for a long time. This includes long distance travel, paralysis, or recovery from an illness or surgery.  Cancer, or cancer treatment.  Having a long, thin tube (catheter) placed inside a vein during a medical procedure.  Being overweight (obese).  Pregnancy and childbirth. Hormone changes make the blood clot more easily during pregnancy. The fetus puts pressure on the veins of the pelvis. There is also risk of injury to veins during delivery or a caesarean. The risk is at its highest just after childbirth.  Medicines with the female hormone estrogen. This includes  birth control pills and hormone replacement therapy.  Smoking.  Other circulation or heart problems. SYMPTOMS When a clot forms, it can either partially or totally block the blood flow in that vein. Symptoms of a DVT can include:  Swelling of the leg or arm, especially if one side is much worse.  Warmth and redness of the leg or arm, especially if one side is much worse.  Pain in an arm or leg. If the clot is in the leg, symptoms may be more noticeable or worse when standing or walking. The symptoms of a DVT that has  traveled to the lungs (pulmonary embolism, PE) usually start suddenly, and include:  Shortness of breath.  Coughing.  Coughing up blood or blood-tinged phlegm.  Chest pain. The chest pain is often worse with deep breaths.  Rapid heartbeat. Anyone with these symptoms should get emergency medical treatment right away. Call your local emergency services (911 in [place] if you have these symptoms. DIAGNOSIS If a DVT is suspected, your caregiver will take a full medical history and carry out a physical exam. Tests that also may be required include:  Blood tests, including studies of the clotting properties of the blood.  Ultrasonography to see if you have clots in your legs or lungs.  X-rays to show the flow of blood when dye is injected into the veins (venography).  Studies of your lungs, if you have any chest symptoms. PREVENTION  Exercise the legs regularly. Take a brisk 30 minute walk every day.  Maintain a weight that is appropriate for your height.  Avoid sitting or lying in bed for long periods of time without moving your legs.  Women, particularly those over the age of 66, should consider the risks and benefits of taking estrogen medicines, including birth control pills.  Do not smoke, especially if you take estrogen medicines.  Long distance travel can increase your risk of DVT. You should exercise your legs by walking or pumping the muscles every hour.  In-hospital prevention:  Many of the risk factors above relate to situations that exist with hospitalization, either for illness, injury, or elective surgery.  Your caregiver will assess you for the need for venous thromboembolism prophylaxis when you are admitted to the hospital. If you are having surgery, your surgeon will assess you the day of or day after surgery.  Prevention may include medical and nonmedical measures. TREATMENT Treatment for DVT helps prevent death and disability. The most common treatment for DVT  is blood thinning (anticoagulant) medicine, which reduces the blood's tendency to clot. Anticoagulants can stop new blood clots from forming and old ones from growing. They cannot dissolve existing clots. Your body does this by itself over time. Anticoagulants can be given by mouth, by intravenous (IV) access, or by injection. Your caregiver will determine the best program for you.  Heparin or related medicines (low molecular weight heparin) are usually the first treatment for a blood clot. They act quickly. However, they cannot be taken orally.  Heparin can cause a fall in a component of blood that stops bleeding and forms blood clots (platelets). You will be monitored with blood tests to be sure this does not occur.  Warfarin is an anticoagulant that can be swallowed (taken orally). It takes a few days to start working, so usually heparin or related medicines are used in combination. Once warfarin is working, heparin is usually stopped.  Less commonly, clot dissolving drugs (thrombolytics) are used to dissolve a DVT. They carry a high  risk of bleeding, so they are used mainly in severe cases, where a life or limb is threatened.  Very rarely, a blood clot in the leg needs to be removed surgically.  If you are unable to take anticoagulants, your caregiver may arrange for you to have a filter placed in a main vein in your belly (abdomen). This filter prevents clots from traveling to your lungs. HOME CARE INSTRUCTIONS  Take all medicines prescribed by your caregiver. Follow the directions carefully.  Warfarin. Most people will continue taking warfarin after hospital discharge. Your caregiver will advise you on the length of treatment (usually 3 6 months, sometimes lifelong).  Too much and too little warfarin are both dangerous. Too much warfarin increases the risk of bleeding. Too little warfarin continues to allow the risk for blood clots. While taking warfarin, you will need to have regular blood  tests to measure your blood clotting time. These blood tests usually include both the prothrombin time (PT) and international normalized ratio (INR) tests. The PT and INR results allow your caregiver to adjust your dose of warfarin. The dose can change for many reasons. It is critically important that you take warfarin exactly as prescribed, and that you have your PT and INR levels drawn exactly as directed.  Many foods, especially foods high in vitamin K can interfere with warfarin and affect the PT and INR results. Foods high in vitamin K include spinach, kale, broccoli, cabbage, collard and turnip greens, brussels sprouts, peas, cauliflower, seaweed, and parsley as well as beef and pork liver, green tea, and soybean oil. You should eat a consistent amount of foods high in vitamin K. Avoid major changes in your diet, or notify your caregiver before changing your diet. Arrange a visit with a dietitian to answer your questions.  Many medicines can interfere with warfarin and affect the PT and INR results. You must tell your caregiver about any and all medicines you take, this includes all vitamins and supplements. Be especially cautious with aspirin and anti-inflammatory medicines. Ask your caregiver before taking these. Do not take or discontinue any prescribed or over-the-counter medicine except on the advice of your caregiver or pharmacist.  Warfarin can have side effects, primarily excessive bruising or bleeding. You will need to hold pressure over cuts for longer than usual. Your caregiver or pharmacist will discuss other potential side effects.  Alcohol can change the body's ability to handle warfarin. It is best to avoid alcoholic drinks or consume only very small amounts while taking warfarin. Notify your caregiver if you change your alcohol intake.  Notify your dentist or other caregivers before procedures.  Activity. Ask your caregiver how soon you can go back to normal activities. It is  important to stay active to prevent blood clots. If you are on anticoagulant medicine, avoid contact sports.  Exercise. It is very important to exercise. This is especially important while traveling, sitting or standing for long periods of time. Exercise your legs by walking or by pumping the muscles frequently. Take frequent walks.  Compression stockings. These are tight elastic stockings that apply pressure to the lower legs. This pressure can help keep the blood in the legs from clotting. You may need to wear compressions stockings at home to help prevent a DVT.  Smoking. If you smoke, quit. Ask your caregiver for help with quitting smoking.  Learn as much as you can about DVT. Knowing more about the condition should help you keep it from coming back.  Wear a medical  alert bracelet or carry a medical alert card. SEEK MEDICAL CARE IF:  You notice a rapid heartbeat.  You feel weaker or more tired than usual.  You feel faint.  You notice increased bruising.  You feel your symptoms are not getting better in the time expected.  You believe you are having side effects of medicine. SEEK IMMEDIATE MEDICAL CARE IF:  You have chest pain.  You have trouble breathing.  You have new or increased swelling or pain in one leg.  You cough up blood.  You notice blood in vomit, in a bowel movement, or in urine. MAKE SURE YOU:  Understand these instructions.  Will watch your condition.  Will get help right away if you are not doing well or get worse. Document Released: 04/12/2005 Document Revised: 01/05/2012 Document Reviewed: 06/04/2010 [facility] Patient Information 2014 [place], [place].

## 2012-10-05 NOTE — Progress Notes (Signed)
This is a Psychologist, occupational Note.  The care of the patient was discussed with Dr. Dalphine Handing and the assessment and plan was formulated with their assistance.  Please see their note for official documentation of the patient encounter.   Subjective:   Patient ID: Marie Willis female   DOB: 1955-08-29 57 y.o.   MRN: [id number]  HPI: Ms. Marie Willis is a 57 y.o. 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 With respect the jaw pain, patient reports that pain in her right rear gums began on Sunday morning (4 days ago) and woke her from sleep. 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 She does not have a regular dentist and reports that in fact she has lost most of her molars with only pieces remaining. The pain is a throbbing pain that has diminished over the past 2 days. She has taken ibuprofen and the MSIR prescribed for her back for the pain, and they help some. Pain is so bad with eating that she is forced to chew only with her left side. She also notes swelling in her right mandible that is now almost completely resolved. Patient is also complains that she felt awful initially, but is feeling better now. Patient measured a temperature to a max of 99-100 F. She denies any drainage that she can tell or odd taste/odor. She denies difficulty with swallowing or breath or neck stiffness. Denies chills or sweats.  With respect to her right leg pain, the patient has been bothered by her right lower leg since the last week of April (approximately 7 weeks). She was taken off of coumadin which was given as prophylaxis after her first DVT in September 2012 in February 2013. Patient is sedentary for ~85% of the day due to her chronic back pain and does not regularly exercise. The pain in her right lower leg is present when walking during which time the throbbing pain  continually gets worse. Pain is alleviated by rest in seconds to minutes, though patient relates that increased activity requires more significant and longer rest to completely alleviate the pain. Patient does not relate pain at rest. Patient feels that there are now 3 "knots" present on her anterior medial shin right above the site of her previous DVT, and these sites are tender to the touch. She reports full ROM in her knee and ankle with minimal joint pain. She denies chest pain or shortness of breath.    Past Medical History  Diagnosis Date  . Anxiety   . Depression   . Hypertension   . Low back pain    Current Outpatient Prescriptions  Medication Sig Dispense Refill  . cetirizine (ZYRTEC) 10 MG tablet Take 10 mg by mouth daily.      . diclofenac (VOLTAREN) 75 MG EC tablet Take 1 tablet (75 mg total) by mouth 2 (two) times daily.  30 tablet  0  . lisinopril (PRINIVIL,ZESTRIL) 20 MG tablet take 1 tablet by mouth once daily  120 tablet  3  . [START ON 11/07/2012] morphine (MSIR) 30 MG tablet Take 1 tablet (30 mg total) by mouth every 6 (six) hours as needed for pain. Not to exceed 3 tablets daily  90 tablet  0   No current facility-administered medications for this visit.   No family history on file. History   Social History  .  Marital Status: Married    Spouse Name: N/A    Number of Children: N/A  . Years of Education: N/A   Occupational History  . former hairdresser    Social History Main Topics  . Smoking status: Current Some Day Smoker -- 0.50 packs/day    Types: Cigarettes  . Smokeless tobacco: Never Used     Comment: smokes 1 pack per week  . Alcohol Use: No  . Drug Use: No  . Sexually Active: None   Other Topics Concern  . None   Social History Narrative   Former Interior and spatial designer, currently unemployed. Lives at home with husband Vivica Dobosz and 3 dogs.      Financial assistance approved by Rudell Cobb for 100 discount at [facility] and has [facility] card, Nov 4th 2011.    Review of Systems: A comprehensive 12 point review of systems was performed and is negative except as stated above. Objective:  Physical Exam: Filed Vitals:   10/05/12 1020  BP: 122/80  Pulse: 75  Temp: 98.4 F (36.9 C)  TempSrc: Oral  Weight: 221 lb 1.6 oz (100.29 kg)  SpO2: 99%   General appearance: alert, cooperative, no distress and smells of smoke Head: Normocephalic, without obvious abnormality, atraumatic Ears: normal TM with abundant cerumen in both ears, R>L Throat: moist mucus membranes of oropharynx. Only bits and pieces of black and brown tooth present where the molars should be bilaterally. Increased erythema to right rear mandibular gums. Neck: no adenopathy, supple, symmetrical, trachea midline and thyroid not enlarged, symmetric, no tenderness/mass/nodules Lungs: clear to auscultation bilaterally and normal work of breathing Heart: regular rate and rhythm, S1, S2 normal, no murmur, click, rub or gallop Extremities: no cyanosis or edema noted. Tender to palpation in right anterior medial shin, nontender to palpation in right calf. Circumference R calf 42.6 cm and L calf 43.5 cm. Pulses: pulses 1+ and equal in bilaterally lower extremities Skin: Skin color and texture normal. No warmth or erythema to right calf. No enlarged superficial veins in lower extremities. Assessment & Plan:  [name] is a 57 y.o. female with a history of tobacco abuse, remote DVT, and multiple dental abscesses presenting for right leg and right jaw pain.  Pain in joint, lower leg 7-8 weeks of pain in the right lower leg, primarily the anterior medial aspect of the lower leg. Throbbing pain is worse with use and alleviated within seconds to minutes of rest. Patient notices 3 "knots" as skin changes. Worrisome for DVT in the presence of a positive history of DVT and tobacco abuse. - Lower extremity venous doppler ultrasound to evaluate for clot. Patient prefers this be done tomorrow for her  schedule, and we will accomodate this. - Would consider further work up for claudication if doppler ultrasound is negative for DVT.  Pain, dental History of poor dentition and multiple dental abscesses. The patient last had a treated abscess in July 2013. Current pain and swelling is improving per patient, but exam and history are concerning for persistent infection. - Clindamycin and metronidazole for aerobic and anaerobic coverage of oral microbes. - Patient will continue ibuprofen for symptomatic relief. - Will obtain a BMP today to evaluate baseline renal function (last BMP in 2012).   FOLLOW UP: Return if problem recurs,  worsens, or new problem develops.

## 2012-10-05 NOTE — Assessment & Plan Note (Addendum)
7-8 weeks of pain in the right lower leg, primarily the anterior medial aspect of the lower leg. Throbbing pain is worse with use and alleviated within seconds to minutes of rest. Patient notices 3 "knots" as skin changes. Worrisome for DVT in the presence of a positive history of DVT and tobacco abuse. - Lower extremity venous doppler ultrasound to evaluate for clot. Patient prefers this be done tomorrow for her schedule, and we will accomodate this. - Would consider further work up for claudication if doppler ultrasound is negative for DVT.

## 2012-10-06 ENCOUNTER — Ambulatory Visit (HOSPITAL_COMMUNITY)
Admission: RE | Admit: 2012-10-06 | Discharge: 2012-10-06 | Disposition: A | Discharge status: Home / Self Care | Payer: Self-pay | Source: Ambulatory Visit | Attending: Internal Medicine | Admitting: Internal Medicine

## 2012-10-06 DIAGNOSIS — M79609 Pain in unspecified limb: Secondary | ICD-10-CM | POA: Insufficient documentation

## 2012-10-06 DIAGNOSIS — M25569 Pain in unspecified knee: Secondary | ICD-10-CM

## 2012-10-06 DIAGNOSIS — M25561 Pain in right knee: Secondary | ICD-10-CM

## 2012-10-06 NOTE — Progress Notes (Signed)
*  Preliminary Results* Right lower extremity venous duplex completed. Right lower extremity is negative for deep vein thrombosis. There is no evidence of right Baker's cyst. Attempted to call results in to Dr.Bhardwaj's office, however there was no answer. Patient was discharged, and can be reached by phone if necessary.  10/06/2012 2:12 PM Gertie Fey, RVT, RDCS, RDMS

## 2012-10-09 ENCOUNTER — Telehealth: Payer: Self-pay | Admitting: Internal Medicine

## 2012-10-09 NOTE — Telephone Encounter (Signed)
I have tried to call the patient several times since the result of her Doppler LE (negative for DVT), however, I cannot reach her on the numbers listed in the system.

## 2012-12-05 ENCOUNTER — Other Ambulatory Visit: Payer: Self-pay | Admitting: Internal Medicine

## 2012-12-05 NOTE — Telephone Encounter (Signed)
Medication refill-lisinopril 

## 2012-12-07 ENCOUNTER — Other Ambulatory Visit: Payer: Self-pay | Admitting: *Deleted

## 2012-12-07 DIAGNOSIS — M549 Dorsalgia, unspecified: Secondary | ICD-10-CM

## 2012-12-07 MED ORDER — MORPHINE SULFATE 30 MG PO TABS: 30.0000 mg | ORAL_TABLET | Freq: Four times a day (QID) | ORAL | Status: DC | PRN

## 2012-12-07 NOTE — Telephone Encounter (Signed)
Refill printed and signed - nurse to complete. 

## 2012-12-07 NOTE — Telephone Encounter (Signed)
Last filled 7/15

## 2012-12-08 NOTE — Telephone Encounter (Signed)
Pt given to pt

## 2013-01-03 ENCOUNTER — Other Ambulatory Visit: Payer: Self-pay | Admitting: *Deleted

## 2013-01-03 DIAGNOSIS — M549 Dorsalgia, unspecified: Secondary | ICD-10-CM

## 2013-01-04 MED ORDER — MORPHINE SULFATE 30 MG PO TABS: 30.0000 mg | ORAL_TABLET | Freq: Four times a day (QID) | ORAL | Status: DC | PRN

## 2013-01-05 ENCOUNTER — Ambulatory Visit: Payer: Self-pay | Admitting: Internal Medicine

## 2013-02-02 ENCOUNTER — Other Ambulatory Visit: Payer: Self-pay | Admitting: *Deleted

## 2013-02-02 DIAGNOSIS — M549 Dorsalgia, unspecified: Secondary | ICD-10-CM

## 2013-02-02 NOTE — Telephone Encounter (Signed)
Last refill 9/10 Call when ready @ [phone]

## 2013-02-05 MED ORDER — MORPHINE SULFATE 30 MG PO TABS: 30.0000 mg | ORAL_TABLET | Freq: Four times a day (QID) | ORAL | Status: DC | PRN

## 2013-02-08 ENCOUNTER — Ambulatory Visit (INDEPENDENT_AMBULATORY_CARE_PROVIDER_SITE_OTHER): Payer: Self-pay | Admitting: Internal Medicine

## 2013-02-08 ENCOUNTER — Ambulatory Visit: Payer: Self-pay

## 2013-02-08 ENCOUNTER — Encounter: Payer: Self-pay | Admitting: Internal Medicine

## 2013-02-08 VITALS — BP 101/65 | HR 70 | Temp 97.7°F | Ht 64.5 in | Wt 215.1 lb

## 2013-02-08 DIAGNOSIS — Z0000 Encounter for general adult medical examination without abnormal findings: Secondary | ICD-10-CM

## 2013-02-08 DIAGNOSIS — I10 Essential (primary) hypertension: Secondary | ICD-10-CM

## 2013-02-08 DIAGNOSIS — Z8659 Personal history of other mental and behavioral disorders: Secondary | ICD-10-CM

## 2013-02-08 DIAGNOSIS — F411 Generalized anxiety disorder: Secondary | ICD-10-CM

## 2013-02-08 DIAGNOSIS — Z23 Encounter for immunization: Secondary | ICD-10-CM

## 2013-02-08 DIAGNOSIS — F17200 Nicotine dependence, unspecified, uncomplicated: Secondary | ICD-10-CM

## 2013-02-08 MED ORDER — CITALOPRAM HYDROBROMIDE 20 MG PO TABS: 20.0000 mg | ORAL_TABLET | Freq: Every day | ORAL | Status: DC

## 2013-02-08 NOTE — Progress Notes (Signed)
Patient ID: Marie Willis, female   DOB: [birth year], 57 y.o.   MRN: [id number]  Subjective:   Patient ID: Marie Willis female   DOB: 05/11/1955 57 y.o.   MRN: [id number]  HPI: [name] is a 57 y.o. F with PMH   C/o anxiety that has worsened over the past few months. She feels like she is "on edge" all the time, and this has caused her to increase the amount that she is smoking. She is currently smoking 1ppd since the age of 57yo. She did quit for 7 yeas in the past. Her husband smokes, which she states making quitting difficult  In regards to her anxiety, she states that this is a constant problem for her. She was on Xanax in the past, which she states did help. She was on Paxil which was ineffective at 20mg  and was over sedating at 40mg . She states that she was unable to afford the Wellbutrin.    Past Medical History  Diagnosis Date  . Anxiety   . Depression   . Hypertension   . Low back pain    Current Outpatient Prescriptions  Medication Sig Dispense Refill  . aspirin 325 MG tablet Take 325 mg by mouth daily.      . cetirizine (ZYRTEC) 10 MG tablet Take 10 mg by mouth daily.      Marland Kitchen lisinopril (PRINIVIL,ZESTRIL) 20 MG tablet take 1 tablet by mouth once daily  120 tablet  0  . morphine (MSIR) 30 MG tablet Take 1 tablet (30 mg total) by mouth every 6 (six) hours as needed for pain. Not to exceed 3 tablets daily  90 tablet  0  . citalopram (CELEXA) 20 MG tablet Take 1 tablet (20 mg total) by mouth daily.  90 tablet  3   No current facility-administered medications for this visit.   No family history on file. History   Social History  . Marital Status: Married    Spouse Name: N/A    Number of Children: N/A  . Years of Education: N/A   Occupational History  . former hairdresser    Social History Main Topics  . Smoking status: Current Some Day Smoker -- 0.75 packs/day    Types: Cigarettes  . Smokeless tobacco: Never Used     Comment: smokes 1 pack per week  . Alcohol  Use: No  . Drug Use: No  . Sexual Activity: None   Other Topics Concern  . None   Social History Narrative   Former Interior and spatial designer, currently unemployed. Lives at home with husband Marie Willis and 3 dogs.      Financial assistance approved by Rudell Cobb for 100 discount at [facility] and has [facility] card, Nov 4th 2011.   Review of Systems: Constitutional: Denies fever, chills, diaphoresis, appetite change and fatigue.  HEENT: Denies photophobia, eye pain, redness, hearing loss, ear pain, congestion, sore throat, rhinorrhea, sneezing, mouth sores, trouble swallowing, neck pain, neck stiffness and tinnitus.   Respiratory: Denies SOB, DOE, cough, chest tightness, and wheezing.   Cardiovascular: Denies chest pain, palpitations.  Gastrointestinal: Denies nausea, vomiting, abdominal pain, diarrhea, constipation, blood in stool and abdominal distention.  Genitourinary: Denies dysuria, urgency, frequency, hematuria, flank pain and difficulty urinating.  Endocrine: Denies: hot or cold intolerance, sweats, changes in hair or nails, polyuria, polydipsia. Musculoskeletal: Endorses occasional right leg pain and "knots in her leg", difficulty ambulating 2/2 back pain.   Skin: Denies pallor, rash and wound.  Neurological: Denies dizziness, seizures, syncope, weakness, light-headedness, numbness and headaches.  Psychiatric/Behavioral: Endorses worsening anxiety and "occasional depression."  Objective:  Physical Exam: Filed Vitals:   02/08/13 0956  BP: 101/65  Pulse: 70  Temp: 97.7 F (36.5 C)  TempSrc: Oral  Height: 5' 4.5" (1.638 m)  Weight: 215 lb 1.6 oz (97.569 kg)  SpO2: 96%   Constitutional: Vital signs reviewed.  Patient is a well-developed and well-nourished female in no acute distress and cooperative with exam.  Head: Normocephalic and atraumatic Mouth: no erythema or exudates, MMM Eyes: PERRL, EOMI, conjunctivae normal, No scleral icterus.  Neck: Supple, Trachea midline normal ROM, No JVD,  mass, thyromegaly Cardiovascular: RRR, S1 normal, S2 normal, no MRG, pulses symmetric and intact bilaterally Pulmonary/Chest: normal respiratory effort, CTAB, no wheezes, rales, or rhonchi Abdominal: Soft. Non-tender, non-distended, bowel sounds are normal, no masses, organomegaly, or guarding present.  Musculoskeletal: No joint deformities, erythema, ROM intact and nontender. Difficulty with ambulation. No extremity edema, BLE without nodularity  Hematology: No cervical adenopathy.  Neurological: A&O x3, Strength is normal and symmetric bilaterally in BU&LE, except in the hands, which is a 3-4/5 bilaterally, cranial nerve II-XII are grossly intact, no focal motor deficit, sensory intact to light touch bilaterally.  Skin: Warm, dry and intact. No rash, cyanosis, or clubbing.  Psychiatric: Pressured speech. Speech is tangential at times but redirectable.   Assessment & Plan:   Please refer to Problem List based Assessment and Plan

## 2013-02-08 NOTE — Assessment & Plan Note (Signed)
Have referred patient to [facility] for her Pap smear/pelvic exam. She's due for colonoscopy, but is trying to the orange card. She's working with Rudell Cobb for this. Will refer to GI which she obtains this.

## 2013-02-08 NOTE — Assessment & Plan Note (Signed)
  Assessment: Progress toward smoking cessation:  smoking more Barriers to progress toward smoking cessation:  other tobacco users at [employer] Comments: The get her anxiety under better control her smoking will also improve  Plan: Instruction/counseling given:  I counseled patient on the dangers of tobacco use, advised patient to stop smoking, and reviewed strategies to maximize success. Educational resources provided:  [employer]) brochure Self management tools provided:    Medications to assist with smoking cessation:  None Patient agreed to the following self-care plans for smoking cessation:    Other plans: She also talked about discussing quitting along with her husband who also smokes. She feels that if they can quit together that this will increase the likelihood that she will continue to stay away from cigarettes. She also feels it to get her anxiety under better control to help reduce the amount of stress she smoking.

## 2013-02-08 NOTE — Patient Instructions (Signed)

## 2013-02-08 NOTE — Assessment & Plan Note (Signed)
Starting Celexa 30 mg by mouth daily for her anxiety and possible depression.

## 2013-02-08 NOTE — Assessment & Plan Note (Signed)
BP Readings from Last 3 Encounters:  02/08/13 101/65  10/05/12 122/80  11/07/11 107/70    Lab Results  Component Value Date   NA 138 10/05/2012   K 4.4 10/05/2012   CREATININE 1.51* 10/05/2012    Assessment: Blood pressure control: controlled Progress toward BP goal:  at goal Comments: She is doing well her blood pressure control  Medications:  continue current medications of Lisinopril 20mg  po daily Educational resources provided: brochure;handout;video

## 2013-02-08 NOTE — Assessment & Plan Note (Signed)
Patient with significant anxiety, that she states has worsened the past few weeks, and has resulted in her increasing her smoking. 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 She states she's been on Paxil and Prozac in the past without good results. Wellbutrin was prescribed in the past as well, but the patient is unable to afford it. She states she has tried Xanax and states that worked really well, but I told her that I was not going to prescribe benzodiazepines for at this time. Will try Celexa, and see how she does in the next 4-8 weeks. I think if we can improve her anxiety but also improve her smoking. - Celexa 20 mg by mouth daily

## 2013-02-15 ENCOUNTER — Ambulatory Visit: Payer: Self-pay

## 2013-02-16 NOTE — Progress Notes (Signed)
Case discussed with Dr. Glenn soon after the resident saw the patient.  We reviewed the resident's history and exam and pertinent patient test results.  I agree with the assessment, diagnosis, and plan of care documented in the resident's note. 

## 2013-02-28 ENCOUNTER — Other Ambulatory Visit: Payer: Self-pay | Admitting: *Deleted

## 2013-02-28 DIAGNOSIS — M549 Dorsalgia, unspecified: Secondary | ICD-10-CM

## 2013-02-28 NOTE — Telephone Encounter (Signed)
Refill not due until 11/12,   Please post the date.

## 2013-03-02 MED ORDER — MORPHINE SULFATE 30 MG PO TABS: 30.0000 mg | ORAL_TABLET | Freq: Four times a day (QID) | ORAL | Status: DC | PRN

## 2013-03-02 NOTE — Telephone Encounter (Signed)
Pt informed Rx are ready 

## 2013-04-03 ENCOUNTER — Other Ambulatory Visit: Payer: Self-pay | Admitting: *Deleted

## 2013-04-03 DIAGNOSIS — M549 Dorsalgia, unspecified: Secondary | ICD-10-CM

## 2013-04-03 NOTE — Telephone Encounter (Signed)
Pt will pick up rx Friday; call when ready [phone].

## 2013-04-04 MED ORDER — LISINOPRIL 20 MG PO TABS: ORAL_TABLET | ORAL | Status: DC

## 2013-04-04 NOTE — Telephone Encounter (Signed)
Prescription waiting on the patient

## 2013-04-27 ENCOUNTER — Encounter (HOSPITAL_COMMUNITY): Payer: Self-pay | Admitting: Emergency Medicine

## 2013-04-27 ENCOUNTER — Emergency Department (HOSPITAL_COMMUNITY)
Admission: EM | Admit: 2013-04-27 | Discharge: 2013-04-27 | Disposition: A | Discharge status: Home / Self Care | Payer: Medicaid Other | Attending: Emergency Medicine | Admitting: Emergency Medicine

## 2013-04-27 DIAGNOSIS — M79609 Pain in unspecified limb: Secondary | ICD-10-CM

## 2013-04-27 DIAGNOSIS — F17200 Nicotine dependence, unspecified, uncomplicated: Secondary | ICD-10-CM | POA: Insufficient documentation

## 2013-04-27 DIAGNOSIS — I10 Essential (primary) hypertension: Secondary | ICD-10-CM | POA: Insufficient documentation

## 2013-04-27 DIAGNOSIS — M25569 Pain in unspecified knee: Secondary | ICD-10-CM | POA: Insufficient documentation

## 2013-04-27 DIAGNOSIS — I82401 Acute embolism and thrombosis of unspecified deep veins of right lower extremity: Secondary | ICD-10-CM

## 2013-04-27 DIAGNOSIS — Z79899 Other long term (current) drug therapy: Secondary | ICD-10-CM | POA: Insufficient documentation

## 2013-04-27 DIAGNOSIS — G8929 Other chronic pain: Secondary | ICD-10-CM | POA: Insufficient documentation

## 2013-04-27 DIAGNOSIS — M25561 Pain in right knee: Secondary | ICD-10-CM

## 2013-04-27 DIAGNOSIS — Z7982 Long term (current) use of aspirin: Secondary | ICD-10-CM | POA: Insufficient documentation

## 2013-04-27 DIAGNOSIS — I82409 Acute embolism and thrombosis of unspecified deep veins of unspecified lower extremity: Secondary | ICD-10-CM | POA: Insufficient documentation

## 2013-04-27 DIAGNOSIS — Z8659 Personal history of other mental and behavioral disorders: Secondary | ICD-10-CM | POA: Insufficient documentation

## 2013-04-27 DIAGNOSIS — R609 Edema, unspecified: Secondary | ICD-10-CM | POA: Insufficient documentation

## 2013-04-27 LAB — COMPREHENSIVE METABOLIC PANEL
ALT: 18 U/L (ref 0–35)
AST: 16 U/L (ref 0–37)
Albumin: 3.6 g/dL (ref 3.5–5.2)
Alkaline Phosphatase: 106 U/L (ref 39–117)
BILIRUBIN TOTAL: 0.2 mg/dL — AB (ref 0.3–1.2)
BUN: 19 mg/dL (ref 6–23)
CALCIUM: 8.7 mg/dL (ref 8.4–10.5)
CO2: 22 mEq/L (ref 19–32)
CREATININE: 1.42 mg/dL — AB (ref 0.50–1.10)
Chloride: 104 mEq/L (ref 96–112)
GFR, EST AFRICAN AMERICAN: 47 mL/min — AB (ref >90)
GFR, EST NON AFRICAN AMERICAN: 41 mL/min — AB (ref >90)
Glucose, Bld: 181 mg/dL — ABNORMAL HIGH (ref 70–99)
POTASSIUM: 4.2 meq/L (ref 3.7–5.3)
Sodium: 139 mEq/L (ref 137–147)
Total Protein: 6.9 g/dL (ref 6.0–8.3)

## 2013-04-27 MED ORDER — ENOXAPARIN SODIUM 100 MG/ML ~~LOC~~ SOLN: 100.0000 mg | Freq: Two times a day (BID) | SUBCUTANEOUS | Status: DC

## 2013-04-27 MED ORDER — WARFARIN SODIUM 5 MG PO TABS
5.0000 mg | ORAL_TABLET | Freq: Once | ORAL | Status: AC
  Administered 2013-04-27: 5 mg via ORAL
  Filled 2013-04-27 (×2): qty 1

## 2013-04-27 MED ORDER — WARFARIN - PHYSICIAN DOSING INPATIENT: Freq: Every day | Status: DC

## 2013-04-27 MED ORDER — ENOXAPARIN SODIUM 100 MG/ML ~~LOC~~ SOLN
100.0000 mg | Freq: Once | SUBCUTANEOUS | Status: AC
  Administered 2013-04-27: 100 mg via SUBCUTANEOUS
  Filled 2013-04-27: qty 1

## 2013-04-27 MED ORDER — WARFARIN SODIUM 5 MG PO TABS: 5.0000 mg | ORAL_TABLET | Freq: Every day | ORAL | Status: DC

## 2013-04-27 NOTE — Discharge Instructions (Signed)
Follow up with Coumadin Clinic on Monday to check INR. Take medications as directed. Return to Emergency department immediately should you experience progressively worsening leg swelling, pain, Chest pain, shortness of breath, or began coughing up blood.    Emergency Department Resource Guide 1) Find a Doctor and Pay Out of Pocket Although you won't have to find out who is covered by your insurance plan, it is a good idea to ask around and get recommendations. You will then need to call the office and see if the doctor you have chosen will accept you as a new patient and what types of options they offer for patients who are self-pay. Some doctors offer discounts or will set up payment plans for their patients who do not have insurance, but you will need to ask so you aren't surprised when you get to your appointment.  2) Contact Your Local Health Department Not all health departments have doctors that can see patients for sick visits, but many do, so it is worth a call to see if yours does. If you don't know where your local health department is, you can check in your phone book. The CDC also has a tool to help you locate your state's health department, and many state websites also have listings of all of their local health departments.  3) Find a Walk-in Clinic If your illness is not likely to be very severe or complicated, you may want to try a walk in clinic. These are popping up all over the country in pharmacies, drugstores, and shopping centers. They're usually staffed by nurse practitioners or physician assistants that have been trained to treat common illnesses and complaints. They're usually fairly quick and inexpensive. However, if you have serious medical issues or chronic medical problems, these are probably not your best option.  No Primary Care Doctor: - Call Health Connect at  [phone] - they can help you locate a primary care doctor that  accepts your insurance, provides certain  services, etc. - Physician Referral Service- [phone]  Chronic Pain Problems: Organization         Address  Phone   Notes  [facility]  [phone] Patients need to be referred by their primary care doctor.   Medication Assistance: Organization         Address  Phone   Notes  [facility] [address] [place], [place] [address] [phone] --Must be a resident of [facility] -- Must have NO insurance coverage whatsoever (no Medicaid/ Medicare, etc.) -- The pt. MUST have a primary care doctor that directs their care regularly and follows them in the community   MedAssist  [phone]   [employer]  [phone]    Agencies that provide inexpensive medical care: Organization         Address  Phone   Notes  [facility]  [phone]   [facility]    [phone]   [facility] [address] [place], [place] [address] [phone]   [facility] [address], [place] [phone]   Planned Parenthood    [phone]   [facility]    [phone]   Community [facility] and [facility]  [address], [place] Phone:  [phone], Fax:  [phone] Hours of Operation:  9 am - 6 pm, M-F.  Also accepts Medicaid/Medicare and self-pay.  [facility] for [place], [address], [place] Phone: [phone], Fax: [phone]. Hours of Operation:  8:30 am - 5:30 pm, M-F.  Also accepts Medicaid and self-pay.  [facility] [address] [place] Phone: [phone]   [place], [place], [place] [phone], Ext. [phone] Mondays & Thursdays: 7-9 AM.  First 15 patients are seen on a first come, first serve basis.    [place] Providers:  Organization         Address  Phone   Notes  [facility] [address], [place] [phone] Also accepts self-pay patients.  [facility] [address], [place]  [phone]   [place], [address], [place] [phone]   [facility] [address], [place] [phone]   Lucianne Lei [address], [place]   [phone] Only accepts [place] Access [place] patients after they have their name applied to their card.   Self-Pay (no insurance) in [facility]:  Organization         Address  Phone   Notes  Sickle Cell Patients, [facility] [place] [phone]   [facility] [place] [phone]   [facility] [place]  [place], [place],  [phone]   [facility]/Dr. Osei-Bonsu  [address] [place] or [address], [place] [phone] Phone number for both [place] and [place] locations is the same.  Urgent Medical and [facility] [address], [place] [phone]   [facility] [address] [place] or [address] [phone]   [facility] [address], [place] [phone], phone; [phone], fax Sees patients 1st and 3rd Saturday of every month.  Must not qualify for public or private insurance (i.e. Medicaid, Medicare, [place] Health Choice, Veterans' Benefits)  Household income should be no more than 200% of the poverty level The clinic cannot treat you if you are pregnant or think you are pregnant  Sexually transmitted diseases are not treated at the clinic.    Dental Care: Organization         Address  Phone  Notes  [facility] Department of [facility] [place] [phone] Accepts  children up to age 58 who are enrolled in [place] or [place]; pregnant women with a Medicaid card; and children who have applied for Medicaid or [place] Health Choice, but were declined, whose parents can pay a reduced fee at time of service.  [facility] Department of [facility]  [address], [place] [phone] Accepts children up to age 33 who are enrolled in [place] or [place]; pregnant women with a Medicaid card; and children who have applied for Medicaid or  Health Choice, but were declined, whose parents can pay a reduced fee at time of service.  [facility] Access PROGRAM  [place] [phone] Patients are seen by appointment only. Walk-ins are not accepted. [place] will see patients 26 years of age and older. Monday - Tuesday (8am-5pm) Most Wednesdays (8:30-5pm) $30 per  visit, cash only  [facility] Adult [employer] PROGRAM  [address], [facility] [phone] Patients are seen by appointment only. Walk-ins are not accepted. [place] will see patients 66 years of age and older. One Wednesday Evening (Monthly: Volunteer Based).  $30 per visit, cash only  [place]  [phone] for adults; Children under age 59, call Graduate Pediatric Dentistry at [phone]. Children aged 87-14, please call [phone] to request a pediatric application.  Dental services are provided in all areas of dental care including fillings, crowns and bridges, complete and partial dentures, implants, gum treatment, root canals, and extractions. Preventive care is also provided. Treatment is provided to both adults and children. Patients are selected via a lottery and there is often a waiting list.   [facility] [address] [place]  [phone] [email or web address]   [facility] [address] [place], [place] [phone], Ext. [phone] Second and  Fourth Thursday of each month, opens at 6:30 AM; Clinic ends at 9 AM.  Patients are seen on a first-come first-served basis, and a limited number are seen during each clinic.   [facility]  [address] Hillard Danker [place], [place] [phone]   Eligibility Requirements You must have lived in [place], [place], or [place] counties for at least the last three months.   You cannot be eligible for state or federal sponsored [employer], including [employer], [place], or [employer].   You generally cannot be eligible for healthcare insurance through your employer.    How to apply: Eligibility screenings are held every Tuesday and Wednesday afternoon from 1:00 pm until 4:00 pm. You do not need an appointment for the interview!  [facility] [address] [place], [place]   Maria Antonia  Zena Department  [place]  [phone]    Behavioral Health Resources in the Community: Intensive Outpatient Programs Organization         Address  Phone  Notes  [place]. [address] [place], [place] [phone]   [facility] Outpatient [address], [place], [place]   ADS: Alcohol & Drug Svcs [address] [place], [place]   [place] [address],  [place], [place] or [phone]   Substance Abuse Resources Organization         Address  Phone  Notes  Alcohol and Drug Services  [phone]   [place]  [phone]   The [place]   Chinita Pester  [phone]   Residential & Outpatient Substance Abuse Program  [phone]   Psychological Services Organization         Address  Phone  Notes  [facility]  [place]  [phone]   [place]  [address] [place] or [phone]    Mobile Crisis Teams Organization         Address  Phone  Notes  [facility], Mobile Crisis Care Unit  [phone]   Assertive Psychotherapeutic Services  [address] [place], [place]   [place] Levels [address] [place] [phone]    Self-Help/Support Groups Organization         Address  Phone  Notes  Mental Health Assoc. of [place] - variety of support groups  [place] Call for more information  Narcotics Anonymous (NA), Caring [facility] [address], [employer] [place]  2 meetings at this location   Special educational needs teacher         Address  Phone  Notes  ASAP Residential Treatment [place],    [place]  [phone]   [facility]  [address] [place] 762831, [place], [place]   [place], [place] [phone] Admissions: 8am-3pm M-F  Incentives Substance [place] [address]    [place], [place] [phone]   [facility] [address] [place], [place], [place]   [facility] [address],  [place], [place]   [facility]ight Programs - Intensive Outpatient [address] Kristeen Mans [address], [place], [place]   [facility] [place]  [place], [place] [phone] or [phone]   [facility] Treatment Services (RTS) [address] [place], [place] Accepts Medicaid  [facility] [employer] [address],  [place] [phone] Substance Abuse/Addiction Treatment   [facility] Organization         Address  Phone  Notes  [facility]  [phone]   Domenic Schwab, PhD [address] Arlis Porta [place], [place]   [phone] or [phone]   [place], [place] [phone]   [facility] [address] [place], [place] [phone] Insurance/Medicaid/sponsorship through [facility] and Families [address], [address] [place]                                    [place], [place] [phone] [place] [address], [place] [phone]    Dr. Adele Schilder  [phone]   [facility] Dept. 1) [address] [place] 2) [place] 3)  [place] 65, [place] [phone]  [phone]   [place] [phone] or [phone] (After Hours)

## 2013-04-27 NOTE — ED Notes (Signed)
Paged Cristine PolioMary Beth Brown, Case Management  to [phone]

## 2013-04-27 NOTE — ED Notes (Signed)
Message sent to pharmacy regarding missing warfarin dose

## 2013-04-27 NOTE — ED Notes (Signed)
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

## 2013-04-27 NOTE — ED Notes (Signed)
PA Lackey at bedside giving dc instructions

## 2013-04-27 NOTE — Progress Notes (Signed)
04/27/2013 a. Gionni Vaca RNCM 1910pm EDCM discussed patient with PA Lackey.

## 2013-04-27 NOTE — Progress Notes (Signed)
VASCULAR LAB PRELIMINARY  PRELIMINARY  PRELIMINARY  PRELIMINARY  Right lower extremity venous duplex completed.    Preliminary report:  Positive for acute DVT coursing from the posterior tibial vein through the popliteal and distal femoral veins. Also noted is a DVT of the proximal gastrocnemius There is no flow noted in the popliteal vein and only minimal flow in the distal femoral vein. There is no evidence of a superficial thrombus or Baker's cyst. No propagation of the thrombus to the left side.  Lupe Bonner, RVS 04/27/2013, 3:26 PM

## 2013-04-27 NOTE — ED Notes (Signed)
PA Lackey at bedside. 

## 2013-04-27 NOTE — Progress Notes (Signed)
   CARE MANAGEMENT ED NOTE 04/27/2013  Patient:  Marie Willis,Marie Willis   Account Number:  [id number]  Date Initiated:  04/27/2013  Documentation initiated by:  Radford PaxFERRERO,Faaris Arizpe  Subjective/Objective Assessment:   Patient presented to [place] with right leg pain and swelling.     Subjective/Objective Assessment Detail:     Action/Plan:   Action/Plan Detail:   Patient to be discharged to home on lovenox an coumadin.   Anticipated DC Date:  04/27/2013     Status Recommendation to Physician:   Result of Recommendation:    Other ED Services  Consult Working Plan    DC Planning Services  CM consult  Medication Assistance  MATCH Program  Other    Choice offered to / List presented to:            Status of service:  Completed, signed off  ED Comments:   ED Comments Detail:  [facility]  Patient to be discharged on lovenox and coumadin.  ED[facility] and spoke to patient drectly.  Patient reports she does not have any insurance or income.  She has had the orange card in the past and is currently in the process of renewing it because it had expired.  Patient confirmed she will be able to afford six dollar copay. Patient also reports she will be able to, "Get someone to take her to the pharmacy to fill her prescriptions." [facility] been in [facility] before.  MATCH program initiated.  Expalined to patient she has seven days to get her RX's filled otherwise the [facility] letter will expire.  Also informed patient that she will not be eligible for this assistance for another rolling year. Patient verbalized understanding.  [facility] [address]  which [facility] a Tour managerparticipatient MATCH pharmacy.  [facility] over [facility] at [phone]. [facility] [phone] and confirmed they received the [facility] letter.  [facility] advised [facility] that patient will be filling her prescriptions this evening.  [facility] called patient back  at [phone] and informed patient that the [facility] had [facility] and [facility] [address]  Patient thankful for services.  No further CM needs at this time.

## 2013-04-27 NOTE — ED Provider Notes (Signed)
CSN: [id number]     Arrival date & time 04/27/13  1354 History   None    Chief Complaint  Patient presents with  . Leg Pain   (Consider location/radiation/quality/duration/timing/severity/associated sxs/prior Treatment) Patient is a 58 y.o. female presenting with leg pain.  Leg Pain  58 yo female presents with RIGHT leg pain and swelling x 3 days. PMH significant for DVT 2 yrs prior that she was treated with bridge anticoagulation therapy. Patient currently not taking any anticoagulation meds and has not had any in over a year. Patient does take daily aspirin. Patient reports pain in medial/posterior Right knee that is worse with activity and described as "feels like I overworked my muscle". Admits to Unilateral leg swelling in RIGHT leg, improves with elevation. Patient admits to sedentary lifestyle. PMH significant for HTN and chronic LBP that she takes medication for, otherwise patient is pretty well healthy. Denies CP, Dyspnea, Palpitations, Cough, Hemoptysis, Fever/chills. Denies hx of irregular heart rhythms.  Past Medical History  Diagnosis Date  . Anxiety   . Depression   . Hypertension   . Low back pain    Past Surgical History  Procedure Laterality Date  . Lumbar laminectomy     History reviewed. No pertinent family history. History  Substance Use Topics  . Smoking status: Current Some Day Smoker -- 0.75 packs/day    Types: Cigarettes  . Smokeless tobacco: Never Used     Comment: smokes 1 pack per week  . Alcohol Use: No   OB History   Grav Para Term Preterm Abortions TAB SAB Ect Mult Living                 Review of Systems  All other systems reviewed and are negative.    Allergies  Tramadol hcl; Amoxicillin; Celexa; Elavil; Gabapentin; Paroxetine hcl; Sulfonamide derivatives; Trazodone and nefazodone; and Zolpidem tartrate  Home Medications   Current Outpatient Rx  Name  Route  Sig  Dispense  Refill  . aspirin 325 MG tablet   Oral   Take 325 mg by mouth  daily.         . cetirizine (ZYRTEC) 10 MG tablet   Oral   Take 10 mg by mouth daily.         Marland Kitchen lisinopril (PRINIVIL,ZESTRIL) 20 MG tablet      take 1 tablet by mouth once daily   120 tablet   3   . morphine (MSIR) 30 MG tablet   Oral   Take 30 mg by mouth every 6 (six) hours as needed for moderate pain or severe pain. Not to exceed 3 tablets daily         . oxymetazoline (AFRIN) 0.05 % nasal spray   Each Nare   Place 1 spray into both nostrils daily as needed for congestion.         . enoxaparin (LOVENOX) 100 MG/ML injection   Subcutaneous   Inject 1 mL (100 mg total) into the skin every 12 (twelve) hours.   8 mL   0   . EXPIRED: enoxaparin (LOVENOX) 150 MG/ML injection   Subcutaneous   Inject 1 mL (150 mg total) into the skin once.   1 mL   0   . warfarin (COUMADIN) 5 MG tablet   Oral   Take 1 tablet (5 mg total) by mouth daily.   30 tablet   2    BP 105/66  Pulse 75  Temp(Src) 97.4 F (36.3 C) (Axillary)  Resp 18  Ht 5\' 4"  (1.626 m)  Wt 210 lb (95.255 kg)  BMI 36.03 kg/m2  SpO2 96% Physical Exam  Nursing note and vitals reviewed. Constitutional: She is oriented to person, place, and time. She appears well-developed and well-nourished. No distress.  HENT:  Head: Normocephalic and atraumatic.  Cardiovascular: Normal rate and regular rhythm.  Exam reveals no gallop and no friction rub.   No murmur heard. Pulses:      Dorsalis pedis pulses are 2+ on the right side, and 2+ on the left side.  No carotid bruits .  Pulmonary/Chest: Effort normal and breath sounds normal. No respiratory distress. She has no wheezes. She has no rales.  Musculoskeletal: Normal range of motion. She exhibits edema (RIGHT Lower Leg).  No bulging leg veins bilaterally. Mild tenderness along lesser saphenous vein of RIGHT leg. 1+ pitting edema on RIGHT lower leg that is greater when compared to LEFT leg. No bruising or ecchymosis bilaterally. Feet warm and dry to touch without  erythema or pallor.   Neurological: She is alert and oriented to person, place, and time. She has normal strength. No cranial nerve deficit or sensory deficit.  Skin: Skin is warm and dry. She is not diaphoretic.  Psychiatric: She has a normal mood and affect. Her behavior is normal.    ED Course  Procedures (including critical care time) Labs Review Labs Reviewed  COMPREHENSIVE METABOLIC PANEL - Abnormal; Notable for the following:    Glucose, Bld 181 (*)    Creatinine, Ser 1.42 (*)    Total Bilirubin 0.2 (*)    GFR calc non Af Amer 41 (*)    GFR calc Af Amer 47 (*)    All other components within normal limits   Imaging Review No results found.  EKG Interpretation   None       MDM   1. Pain in joint, lower leg, right   2. DVT (deep venous thrombosis), right    RIGHT lower extremity venous US positive for acute DVT coursing from posterior tibial vein through popliteal and distal femoral veins. Additional DVT noted at proximal gastrocnemius. No propagation of thrombus to Left side.   CMP pending. Plan to check patient renal function and calculate anticoagulation dosing accordingly. Pharmacy contacted and pharmacist reports no interaction with patients current medications. Plan to treat patient outpatient with bridge therapy of Lovenox and coumadin. Advise patient to set up appointment with Coumadin clinic on Monday for INR check.   Labs shows elevated Creatinine. Will get accurate ht and wt. Dosing calculated accordingly.  CrCl 67.5 original CrCl 50.2 weight adjusted.  Pharmacy recommendations: CrCl > 30 = Lovenox 100 BID  Warfarin 5 mg/day  Spoke with case management, who has also spoken with patient. They have agreed to set patient up with "Match" program so that patient can afford medications.   Follow up with Coumadin Clinic on Monday to check INR. Take medications as directed. Return to Emergency department immediately should you experience progressively worsening  leg swelling, pain, Chest pain, shortness of breath, or began coughing up blood. Patient agrees with plan. Discharged in good condition.   Meds given in ED:  Medications  Warfarin - Physician Dosing Inpatient (not administered)  enoxaparin (LOVENOX) injection 100 mg (100 mg Subcutaneous Given 04/27/13 1848)  warfarin (COUMADIN) tablet 5 mg (5 mg Oral Given 04/27/13 1917)    New Prescriptions   ENOXAPARIN (LOVENOX) 100 MG/ML INJECTION    Inject 1 mL (100 mg total) into the skin every 12 (twelve) hours.  WARFARIN (COUMADIN) 5 MG TABLET    Take 1 tablet (5 mg total) by mouth daily.      Rudene AndaJacob Gray Beau [place], PA-C 04/27/13 1924

## 2013-04-27 NOTE — ED Notes (Signed)
Phlebotomy at bedside.

## 2013-04-29 NOTE — ED Provider Notes (Signed)
  This was a shared visit with a mid-level provided (NP or PA).  Throughout the patient's course I was available for consultation/collaboration.  I saw the ECG (if appropriate), relevant labs and studies - I agree with the interpretation.  On my exam the patient was in no distress.  Patient has a notable history of prior DVT.  With this the demonstration of recurrent phenomena she required initiation of anticoagulation.  Had no evidence of distress throughout her emergency department course.  Facilitation of appropriate outpatient therapy was arranged with the assistance of social work.  She is appropriate for discharge with outpatient followup.      Gerhard Munchobert Eulala [place]combe, MD 04/29/13 0005

## 2013-05-02 ENCOUNTER — Encounter: Payer: Self-pay | Admitting: Internal Medicine

## 2013-05-02 ENCOUNTER — Ambulatory Visit (INDEPENDENT_AMBULATORY_CARE_PROVIDER_SITE_OTHER): Payer: Self-pay | Admitting: Internal Medicine

## 2013-05-02 VITALS — BP 115/74 | HR 67 | Temp 97.7°F | Ht 64.5 in | Wt 218.8 lb

## 2013-05-02 DIAGNOSIS — I82409 Acute embolism and thrombosis of unspecified deep veins of unspecified lower extremity: Secondary | ICD-10-CM

## 2013-05-02 DIAGNOSIS — I82401 Acute embolism and thrombosis of unspecified deep veins of right lower extremity: Secondary | ICD-10-CM

## 2013-05-02 LAB — POCT INR: INR: 1.5

## 2013-05-02 NOTE — Progress Notes (Signed)
   Subjective:    Patient ID: Marie Willis, female    DOB: 09/08/1955, [age] y.o.   MRN: [id number]  HPI Marie Willis is a 58 yo woman pmh as listed below presents for ED follow up.   Confirmed DVT on 04/27/13. Pt has had hx of DVTs in the past. Pt is having improving pain and only some LE edema that is worse with periods of long standing and usually at the end of the day. She only has sometimes intermittent muscle aching in the right leg versus the left leg but has not noticed any coolness, mottling of skin or rashes of her right leg. She still able to fully ambulate and perform her ADLs without problem or concern and is not limited by these intermittent muscle aches. She has been compliant with the Lovenox shots and warfarin pills since her ED discharge.   Review of Systems  Constitutional: Negative for fever, chills, activity change and fatigue.  Respiratory: Negative for cough, chest tightness and shortness of breath.   Cardiovascular: Positive for leg swelling. Negative for chest pain and palpitations.  Gastrointestinal: Negative for nausea, vomiting and abdominal pain.  Neurological: Negative for tremors, weakness and numbness.  All other systems reviewed and are negative.    Past Medical History  Diagnosis Date  . Anxiety   . Depression   . Hypertension   . Low back pain    Social, surgical, family hx reviewed.     Objective:   Physical Exam Filed Vitals:   05/02/13 1320  BP: 115/74  Pulse: 67  Temp: 97.7 F (36.5 C)   General: sitting in chair, NAD HEENT: PERRL, EOMI, no scleral icterus Cardiac: RRR, no rubs, murmurs or gallops Pulm: clear to auscultation bilaterally, moving normal volumes of air Abd: soft, nontender, nondistended, BS present Ext: warm and well perfused, no pedal edema, some slight right leg greater than left leg circumference, good perfusion bilaterally, FROM, normal ambulation,  Neuro: alert and oriented X3, cranial nerves II-XII grossly intact       Assessment & Plan:  Please problem oriented charting.   Pt discussed with Dr. Rogelia BogaButcher

## 2013-05-02 NOTE — Patient Instructions (Signed)
It was nice to meet you today and glad you are feeling a bit better.   We will work on getting your INR level with the shots and have good follow up with coumadin clinic.

## 2013-05-03 NOTE — Assessment & Plan Note (Signed)
Pt had confirmed RIGHT lower extremity venous US positive for acute DVT coursing from posterior tibial vein through popliteal and distal femoral veins. Additional DVT noted at proximal gastrocnemius. No propagation of thrombus to Left side during recent ED visit. Pt still subtherapeutic INR 1.5 today.  -provided with lovenox shots and warfarin -coumadin clinic follow up appt made -cont current management and pt educated on warning symptoms that would require immediate evaluation for threatened limb or PE

## 2013-05-03 NOTE — Progress Notes (Signed)
Case discussed with Dr. Burtis JunesSadek soon after the resident saw the patient.  We reviewed the resident's history and exam and pertinent patient test results.  I agree with the assessment, diagnosis, and plan of care documented in the resident's note. We discussed stockings to prevent postphlebitic syndrome.

## 2013-05-04 ENCOUNTER — Ambulatory Visit (INDEPENDENT_AMBULATORY_CARE_PROVIDER_SITE_OTHER): Payer: Self-pay | Admitting: Pharmacist

## 2013-05-04 ENCOUNTER — Other Ambulatory Visit: Payer: Self-pay | Admitting: *Deleted

## 2013-05-04 DIAGNOSIS — I82401 Acute embolism and thrombosis of unspecified deep veins of right lower extremity: Secondary | ICD-10-CM

## 2013-05-04 DIAGNOSIS — Z7901 Long term (current) use of anticoagulants: Secondary | ICD-10-CM

## 2013-05-04 DIAGNOSIS — Z86718 Personal history of other venous thrombosis and embolism: Secondary | ICD-10-CM

## 2013-05-04 LAB — POCT INR: INR: 3.1

## 2013-05-04 MED ORDER — MORPHINE SULFATE 30 MG PO TABS: 30.0000 mg | ORAL_TABLET | Freq: Four times a day (QID) | ORAL | Status: DC | PRN

## 2013-05-04 NOTE — Progress Notes (Signed)
Anti-Coagulation Progress Note  Marie Willis is a 58 y.o. female who is currently on an anti-coagulation regimen.    RECENT RESULTS: Recent results are below, the most recent result is correlated with a dose of 42.5 mg. per week (average daily weighted dose 6mg ) PLUS concomitant Lovenox 1mg /kg SQ q12h--which we will CONTINUE through her next visit in 5 days.  Lab Results  Component Value Date   INR 3.1 05/04/2013   INR 1.5 05/02/2013   INR 2.40 05/31/2011    ANTI-COAG DOSE: Anticoagulation Dose Instructions as of 05/04/2013     Marie SmilesSun Mon Tue Wed Thu Fri Sat   New Dose 5 mg 7.5 mg 5 mg 5 mg 7.5 mg 5 mg 5 mg       ANTICOAG SUMMARY: Anticoagulation Episode Summary   Current INR goal 2.0-3.0  Next INR check 05/08/2013  INR from last check 3.1! (05/04/2013)  Weekly max dose   Target end date Indefinite  [facility]  Preferred lab   Send INR reminders to    Indications  DVT (deep venous thrombosis) [453.40]        Comments [facility] acute [facility] for DVT. 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        ANTICOAG TODAY: Anticoagulation Summary as of 05/04/2013   INR goal 2.0-3.0  Selected INR 3.1! (05/04/2013)  Next INR check 05/08/2013  Target end date Indefinite   Indications  DVT (deep venous thrombosis) [453.40]      Anticoagulation Episode Summary   [facility]   Preferred lab    Send INR reminders to    Comments [facility] for [facility]. 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      PATIENT INSTRUCTIONS: Patient Instructions  Patient instructed to take medications as defined in the Anti-coagulation  Track section of this encounter.  Patient instructed to take today's dose.  Patient verbalized understanding of these instructions.       FOLLOW-UP Return in 4 days (on 05/08/2013) for Follow up INR on TUESDAY at 2PM.  Hulen LusterJames Odai Wimmer, [name] Pharm.D., CACP

## 2013-05-04 NOTE — Telephone Encounter (Signed)
Refill printed and signed - nurse to complete.  I destroyed the prescription written by Dr. Glenn. 

## 2013-05-04 NOTE — Telephone Encounter (Signed)
Pt's here to pick up rx for MSIR but written on rx "do not fill until 05/07/13". States d/t 31 days in December, need rx filled on the 11th. I talked to Dr Sherrine MaplesGlenn, who's out of the hospital, request [facility] to re-write rx. Last refilled 04/06/13 per [facility].

## 2013-05-04 NOTE — Patient Instructions (Signed)
Patient instructed to take medications as defined in the Anti-coagulation Track section of this encounter.  Patient instructed to take today's dose.  Patient verbalized understanding of these instructions.    

## 2013-05-08 ENCOUNTER — Ambulatory Visit (INDEPENDENT_AMBULATORY_CARE_PROVIDER_SITE_OTHER): Payer: Self-pay | Admitting: Pharmacist

## 2013-05-08 DIAGNOSIS — Z7901 Long term (current) use of anticoagulants: Secondary | ICD-10-CM

## 2013-05-08 DIAGNOSIS — I82401 Acute embolism and thrombosis of unspecified deep veins of right lower extremity: Secondary | ICD-10-CM

## 2013-05-08 DIAGNOSIS — I82409 Acute embolism and thrombosis of unspecified deep veins of unspecified lower extremity: Secondary | ICD-10-CM

## 2013-05-08 LAB — POCT INR: INR: 3.1

## 2013-05-08 NOTE — Patient Instructions (Signed)
Patient instructed to take medications as defined in the Anti-coagulation Track section of this encounter.  Patient instructed to take today's dose.  Patient verbalized understanding of these instructions.    

## 2013-05-08 NOTE — Progress Notes (Signed)
Anti-Coagulation Progress Note  Marie CargoSusan Mcclintock is a 58 y.o. female who is currently on an anti-coagulation regimen.    RECENT RESULTS: Recent results are below, the most recent result is correlated with a dose of 40 mg. per week: Lab Results  Component Value Date   INR 3.10 05/08/2013   INR 3.1 05/04/2013   INR 1.5 05/02/2013    ANTI-COAG DOSE: Anticoagulation Dose Instructions as of 05/08/2013     Glynis SmilesSun Mon Tue Wed Thu Fri Sat   New Dose 5 mg 5 mg 5 mg 5 mg 5 mg 5 mg 5 mg       ANTICOAG SUMMARY: Anticoagulation Episode Summary   Current INR goal 2.0-3.0  Next INR check 05/14/2013  INR from last check 3.10! (05/08/2013)  Weekly max dose   Target end date Indefinite  [facility]  Preferred lab   Send INR reminders to    Indications  DVT (deep venous thrombosis) [453.40]        Comments [facility] for DVT. 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        ANTICOAG TODAY: Anticoagulation Summary as of 05/08/2013   INR goal 2.0-3.0  Selected INR 3.10! (05/08/2013)  Next INR check 05/14/2013  Target end date Indefinite   Indications  DVT (deep venous thrombosis) [453.40]      Anticoagulation Episode Summary   [facility]   Preferred lab    Send INR reminders to    Comments [facility]. 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      PATIENT INSTRUCTIONS: Patient Instructions  Patient instructed to take medications as defined in the Anti-coagulation Track section of this encounter.  Patient instructed to take today's dose.  Patient verbalized understanding of these instructions.        FOLLOW-UP Return in 6 days (on 05/14/2013) for Follow up INR at 1530h.  Hulen LusterJames Vi Biddinger, [name] Pharm.D., CACP

## 2013-05-21 ENCOUNTER — Ambulatory Visit: Payer: Medicaid Other

## 2013-05-21 LAB — POCT INR: INR: 2.6

## 2013-05-22 ENCOUNTER — Ambulatory Visit (INDEPENDENT_AMBULATORY_CARE_PROVIDER_SITE_OTHER): Payer: Self-pay | Admitting: Pharmacist

## 2013-05-22 DIAGNOSIS — Z7902 Long term (current) use of antithrombotics/antiplatelets: Secondary | ICD-10-CM

## 2013-05-22 DIAGNOSIS — I82401 Acute embolism and thrombosis of unspecified deep veins of right lower extremity: Secondary | ICD-10-CM

## 2013-05-22 DIAGNOSIS — I82409 Acute embolism and thrombosis of unspecified deep veins of unspecified lower extremity: Secondary | ICD-10-CM

## 2013-05-23 NOTE — Patient Instructions (Signed)
Patient instructed to take medications as defined in the Anti-coagulation Track section of this encounter.  Patient instructed to take today's dose.  Patient verbalized understanding of these instructions.    

## 2013-05-23 NOTE — Progress Notes (Signed)
Anti-Coagulation Progress Note  Marie CargoSusan Willis is a 58 y.o. female who is currently on an anti-coagulation regimen.    RECENT RESULTS: Recent results are below, the most recent result is correlated with a dose of 35 mg. per week: Lab Results  Component Value Date   INR 2.60 05/21/2013   INR 3.10 05/08/2013   INR 3.1 05/04/2013    ANTI-COAG DOSE: Anticoagulation Dose Instructions as of 05/22/2013     Marie SmilesSun Mon Tue Wed Thu Fri Sat   New Dose 5 mg 5 mg 5 mg 5 mg 5 mg 5 mg 5 mg       ANTICOAG SUMMARY: Anticoagulation Episode Summary   Current INR goal 2.0-3.0  Next INR check 06/04/2013  INR from last check 2.60 (05/21/2013)  Weekly max dose   Target end date Indefinite  [facility]  Preferred lab   Send INR reminders to    Indications  DVT (deep venous thrombosis) [453.40]        Comments [facility] for [facility]. 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        ANTICOAG TODAY: Anticoagulation Summary as of 05/22/2013   INR goal 2.0-3.0  Selected INR 2.60 (05/21/2013)  Next INR check 06/04/2013  Target end date Indefinite   Indications  DVT (deep venous thrombosis) [453.40]      Anticoagulation Episode Summary   [facility]   Preferred lab    Send INR reminders to    Comments [facility] for [facility]VT. 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      PATIENT INSTRUCTIONS: Patient Instructions  Patient instructed to take medications as defined in the Anti-coagulation Track section of this encounter.  Patient instructed to take today's dose.  Patient verbalized understanding of these instructions.        FOLLOW-UP Return in 2 weeks (on 06/04/2013) for Follow up INR at 3:30PM.  Marie LusterJames Zayed Willis, [name] Pharm.D., CACP

## 2013-05-23 NOTE — Progress Notes (Signed)
  Indication: recurrent dvt.  Duration: indefinite.  INR: 2-3 target.  Agree with Dr. Saralyn PilarGroce's assessment and plan.

## 2013-05-28 ENCOUNTER — Other Ambulatory Visit: Payer: Self-pay | Admitting: *Deleted

## 2013-05-28 NOTE — Telephone Encounter (Signed)
Wants to pick up rx on the 9th when she sees Dr Alexandria LodgeGroce.  Also states it should time to pick up rxs d/t 31 days in Dec and Jan.  Thanks

## 2013-05-29 MED ORDER — MORPHINE SULFATE 30 MG PO TABS: 30.0000 mg | ORAL_TABLET | Freq: Four times a day (QID) | ORAL | Status: DC | PRN

## 2013-06-04 ENCOUNTER — Ambulatory Visit (INDEPENDENT_AMBULATORY_CARE_PROVIDER_SITE_OTHER): Payer: Self-pay | Admitting: Pharmacist

## 2013-06-04 ENCOUNTER — Ambulatory Visit: Payer: Self-pay

## 2013-06-04 DIAGNOSIS — I82401 Acute embolism and thrombosis of unspecified deep veins of right lower extremity: Secondary | ICD-10-CM

## 2013-06-04 DIAGNOSIS — I82409 Acute embolism and thrombosis of unspecified deep veins of unspecified lower extremity: Secondary | ICD-10-CM

## 2013-06-04 LAB — POCT INR: INR: 3.2

## 2013-06-04 NOTE — Progress Notes (Signed)
Anti-Coagulation Progress Note  Marie Willis is a 58 y.o. female who is currently on an anti-coagulation regimen.    RECENT RESULTS: Recent results are below, the most recent result is correlated with a dose of 35 mg. per week: Lab Results  Component Value Date   INR 3.20 06/04/2013   INR 2.60 05/21/2013   INR 3.10 05/08/2013    ANTI-COAG DOSE: Anticoagulation Dose Instructions as of 06/04/2013     Marie SmilesSun Mon Tue Wed Thu Fri Willis   New Dose 5 mg 2.5 mg 5 mg 5 mg 2.5 mg 5 mg 5 mg       ANTICOAG SUMMARY: Anticoagulation Episode Summary   Current INR goal 2.0-3.0  Next INR check 06/25/2013  INR from last check 3.20! (06/04/2013)  Weekly max dose   Target end date Indefinite  [facility]  Preferred lab   Send INR reminders to    Indications  DVT (deep venous thrombosis) [453.40]        Comments [facility]. 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        ANTICOAG TODAY: Anticoagulation Summary as of 06/04/2013   INR goal 2.0-3.0  Selected INR 3.20! (06/04/2013)  Next INR check 06/25/2013  Target end date Indefinite   Indications  DVT (deep venous thrombosis) [453.40]      Anticoagulation Episode Summary   IN[facility]   Preferred lab    Send INR reminders to    [facility]VT. 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      PATIENT INSTRUCTIONS: Patient Instructions  Patient instructed to take medications as defined in the Anti-coagulation Track section of this encounter.  Patient instructed to take today's dose.  Patient verbalized understanding of these  instructions.       FOLLOW-UP Return in 3 weeks (on 06/25/2013) for Follow up INR at 3:15PM.  Marie Willis, [name] Pharm.D., CACP

## 2013-06-04 NOTE — Patient Instructions (Signed)
Patient instructed to take medications as defined in the Anti-coagulation Track section of this encounter.  Patient instructed to take today's dose.  Patient verbalized understanding of these instructions.    

## 2013-06-25 ENCOUNTER — Ambulatory Visit (INDEPENDENT_AMBULATORY_CARE_PROVIDER_SITE_OTHER): Payer: Self-pay | Admitting: Pharmacist

## 2013-06-25 ENCOUNTER — Ambulatory Visit: Payer: Self-pay

## 2013-06-25 DIAGNOSIS — Z7902 Long term (current) use of antithrombotics/antiplatelets: Secondary | ICD-10-CM

## 2013-06-25 DIAGNOSIS — I82401 Acute embolism and thrombosis of unspecified deep veins of right lower extremity: Secondary | ICD-10-CM

## 2013-06-25 DIAGNOSIS — I82409 Acute embolism and thrombosis of unspecified deep veins of unspecified lower extremity: Secondary | ICD-10-CM

## 2013-06-25 LAB — POCT INR: INR: 2.3

## 2013-06-25 NOTE — Progress Notes (Signed)
Anti-Coagulation Progress Note  Marie CargoSusan Depriest is a 58 y.o. female who is currently on an anti-coagulation regimen.    RECENT RESULTS: Recent results are below, the most recent result is correlated with a dose of 30 mg. per week: Lab Results  Component Value Date   INR 2.3 06/25/2013   INR 3.20 06/04/2013   INR 2.60 05/21/2013    ANTI-COAG DOSE: Anticoagulation Dose Instructions as of 06/25/2013     Glynis SmilesSun Mon Tue Wed Thu Fri Sat   New Dose 5 mg 2.5 mg 5 mg 5 mg 2.5 mg 5 mg 5 mg       ANTICOAG SUMMARY: Anticoagulation Episode Summary   Current INR goal 2.0-3.0  Next INR check 07/16/2013  INR from last check 2.3 (06/25/2013)  Weekly max dose   Target end date Indefinite  [facility]  Preferred lab   Send INR reminders to    Indications  DVT (deep venous thrombosis) [453.40]        Comments [facility] for DVT. 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        ANTICOAG TODAY: Anticoagulation Summary as of 06/25/2013   INR goal 2.0-3.0  Selected INR 2.3 (06/25/2013)  Next INR check 07/16/2013  Target end date Indefinite   Indications  DVT (deep venous thrombosis) [453.40]      Anticoagulation Episode Summary   IN[facility] check location [facility]   Preferred lab    Send INR reminders to    Comments [facility] for DVT. 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      PATIENT INSTRUCTIONS: Patient Instructions  Patient instructed to take medications as defined in the Anti-coagulation Track section of this encounter.  Patient instructed to take today's dose.  Patient verbalized understanding of these instructions.       FOLLOW-UP Return in about 3 weeks (around 07/16/2013) for follow up INR at 3pm.  Hulen LusterJames Jody Aguinaga, [name] Pharm.D., CACP

## 2013-06-25 NOTE — Progress Notes (Signed)
There is conflicting information in the chart.  The progress note states that she has had recurrent unprovoked DVTs, yet the problem list states that the September 2012 DVT "appears to be" the initial DVT.  Will forward to patient's PCP, Dr. Sherrine MaplesGlenn, to clarify with patient and in the problem list.

## 2013-06-25 NOTE — Patient Instructions (Signed)
Patient instructed to take medications as defined in the Anti-coagulation Track section of this encounter.  Patient instructed to take today's dose.  Patient verbalized understanding of these instructions.    

## 2013-07-02 ENCOUNTER — Telehealth: Payer: Self-pay | Admitting: *Deleted

## 2013-07-02 DIAGNOSIS — I82409 Acute embolism and thrombosis of unspecified deep veins of unspecified lower extremity: Secondary | ICD-10-CM

## 2013-07-02 NOTE — Telephone Encounter (Signed)
Pt states needs refill on Coumadin - [facility]/[place]. Stanton KidneyDebra Alejandrina Raimer RN 07/02/13 3PM

## 2013-07-03 MED ORDER — WARFARIN SODIUM 5 MG PO TABS: ORAL_TABLET | ORAL | Status: DC

## 2013-07-04 NOTE — Telephone Encounter (Signed)
Rx called in to pharmacy. 

## 2013-07-16 ENCOUNTER — Ambulatory Visit (INDEPENDENT_AMBULATORY_CARE_PROVIDER_SITE_OTHER): Payer: Self-pay | Admitting: Pharmacist

## 2013-07-16 DIAGNOSIS — I82409 Acute embolism and thrombosis of unspecified deep veins of unspecified lower extremity: Secondary | ICD-10-CM

## 2013-07-16 DIAGNOSIS — Z7902 Long term (current) use of antithrombotics/antiplatelets: Secondary | ICD-10-CM

## 2013-07-16 DIAGNOSIS — I82401 Acute embolism and thrombosis of unspecified deep veins of right lower extremity: Secondary | ICD-10-CM

## 2013-07-16 LAB — POCT INR: INR: 2.9

## 2013-07-16 MED ORDER — WARFARIN SODIUM 5 MG PO TABS: ORAL_TABLET | ORAL | Status: DC

## 2013-07-16 NOTE — Progress Notes (Signed)
Anti-Coagulation Progress Note  Marie CargoSusan Willis is a 58 y.o. female who is currently on an anti-coagulation regimen.    RECENT RESULTS: Recent results are below, the most recent result is correlated with a dose of 30 mg. per week: Lab Results  Component Value Date   INR 2.90 07/16/2013   INR 2.3 06/25/2013   INR 3.20 06/04/2013    ANTI-COAG DOSE: Anticoagulation Dose Instructions as of 07/16/2013     Glynis SmilesSun Mon Tue Wed Thu Fri Sat   New Dose 5 mg 2.5 mg 5 mg 2.5 mg 5 mg 2.5 mg 5 mg       ANTICOAG SUMMARY: Anticoagulation Episode Summary   Current INR goal 2.0-3.0  Next INR check 08/13/2013  INR from last check 2.90 (07/16/2013)  Weekly max dose   Target end date Indefinite  [facility]  Preferred lab   Send INR reminders to    Indications  DVT (deep venous thrombosis) [453.40]        Comments [facility] acute [facility] for DVT. 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        ANTICOAG TODAY: Anticoagulation Summary as of 07/16/2013   INR goal 2.0-3.0  Selected INR 2.90 (07/16/2013)  Next INR check 08/13/2013  Target end date Indefinite   Indications  DVT (deep venous thrombosis) [453.40]      Anticoagulation Episode Summary   [facility]   Preferred lab    Send INR reminders to    Comments [facility] for DVT. 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      PATIENT INSTRUCTIONS: Patient Instructions  Patient instructed to take medications as defined in the Anti-coagulation Track section of this encounter.  Patient instructed to take today's dose.  Patient verbalized understanding of these  instructions.       FOLLOW-UP Return in 4 weeks (on 08/13/2013) for Follow up INR at 3:15PM.  Hulen LusterJames Philippa Vessey, [name] Pharm.D., CACP

## 2013-07-16 NOTE — Patient Instructions (Signed)
Patient instructed to take medications as defined in the Anti-coagulation Track section of this encounter.  Patient instructed to take today's dose.  Patient verbalized understanding of these instructions.    

## 2013-07-16 NOTE — Progress Notes (Signed)
There is conflicting information in the chart. The progress note states that she has had recurrent unprovoked DVTs, yet the problem list states that the September 2012 DVT "appears to be" the initial DVT. Will schedule an appointment with Dr. Sherrine MaplesGlenn to clarify the issue and get the patient's decision on chronic anticoagulation after discussion of the risks and benefits.

## 2013-07-24 ENCOUNTER — Ambulatory Visit: Payer: Self-pay

## 2013-08-01 ENCOUNTER — Other Ambulatory Visit: Payer: Self-pay | Admitting: *Deleted

## 2013-08-01 DIAGNOSIS — I82409 Acute embolism and thrombosis of unspecified deep veins of unspecified lower extremity: Secondary | ICD-10-CM

## 2013-08-02 MED ORDER — WARFARIN SODIUM 5 MG PO TABS: ORAL_TABLET | ORAL | Status: DC

## 2013-08-02 MED ORDER — LISINOPRIL 20 MG PO TABS: ORAL_TABLET | ORAL | Status: DC

## 2013-08-09 ENCOUNTER — Ambulatory Visit (INDEPENDENT_AMBULATORY_CARE_PROVIDER_SITE_OTHER): Payer: Self-pay | Admitting: Internal Medicine

## 2013-08-09 DIAGNOSIS — Z86718 Personal history of other venous thrombosis and embolism: Secondary | ICD-10-CM

## 2013-08-09 DIAGNOSIS — F411 Generalized anxiety disorder: Secondary | ICD-10-CM

## 2013-08-09 DIAGNOSIS — Z8659 Personal history of other mental and behavioral disorders: Secondary | ICD-10-CM

## 2013-08-09 DIAGNOSIS — F17200 Nicotine dependence, unspecified, uncomplicated: Secondary | ICD-10-CM

## 2013-08-09 DIAGNOSIS — I82409 Acute embolism and thrombosis of unspecified deep veins of unspecified lower extremity: Secondary | ICD-10-CM

## 2013-08-09 DIAGNOSIS — Z7901 Long term (current) use of anticoagulants: Secondary | ICD-10-CM

## 2013-08-09 NOTE — Progress Notes (Signed)
Patient ID: Ebony CargoSusan Meiser, female   DOB: 03/16/1956, 58 y.o.   MRN: [id number]  Subjective:   Patient ID: Ebony CargoSusan Packett female   DOB: 05/29/1955 58 y.o.   MRN: [id number]  HPI: Ms.Nohemi Su HiltRoberts is a 58 y.o. F with PMH anxiety and recurrent DVTs presents for a f/u for her DVTs.  She is currently on Coumadin for her right leg DVT. She was 1st diagnosed with a DVT in 2012 and was treated for about 6 months with Coumadin. She had a recurrent DVT in the R leg 04/2013 and Coumadin was restarted. Last INR was therapeutic on 07/16/13. She will require life-long coumadin. She denies any issues with bleeding or bruising.  She is over due for her colonoscopy and denies any N/V, diarrhea, constipation, abdominal pain, blood in her stool or dark tarry stools. She is due for a mammogram and pap smear- she sees GYN for her paps. She states that all of her pap smears have been normal.   She states that the Celexa caused dizziness and she is not taking it currently. She states that she used to be on Xanax until '08 and nothing worked as well to control her anxiety. She is amenable to Psych referral. She is smoking 10-1ppd and it varies depending on her nerves. She feels that her anxiety is increasing.  Past Medical History  Diagnosis Date  . Anxiety   . Depression   . Hypertension   . Low back pain    Current Outpatient Prescriptions  Medication Sig Dispense Refill  . cetirizine (ZYRTEC) 10 MG tablet Take 10 mg by mouth daily.      Marland Kitchen. lisinopril (PRINIVIL,ZESTRIL) 20 MG tablet take 1 tablet by mouth once daily  90 tablet  4  . morphine (MSIR) 30 MG tablet Take 1 tablet (30 mg total) by mouth every 6 (six) hours as needed for moderate pain or severe pain. Not to exceed 3 tablets daily  90 tablet  0  . warfarin (COUMADIN) 5 MG tablet Take as directed. Currently taking 1/2 tablet MWF; 1 tablet all other days.  30 tablet  5  . aspirin 325 MG tablet Take 325 mg by mouth daily.      Marland Kitchen. oxymetazoline (AFRIN) 0.05 % nasal  spray Place 1 spray into both nostrils daily as needed for congestion.       No current facility-administered medications for this visit.   No family history on file. History   Social History  . Marital Status: Married    Spouse Name: N/A    Number of Children: N/A  . Years of Education: N/A   Occupational History  . former hairdresser    Social History Main Topics  . Smoking status: Current Every Day Smoker -- 1.00 packs/day    Types: Cigarettes  . Smokeless tobacco: Never Used     Comment: Stress lately.  . Alcohol Use: No  . Drug Use: No  . Sexual Activity: Not on file   Other Topics Concern  . Not on file   Social History Narrative   Former Interior and spatial designerhairdresser, currently unemployed. Lives at home with husband Edwyna Shellbenny Inskeep and 3 dogs.      Financial assistance approved by [place] for [facility] and [facility], Nov 4th 2011.   Review of Systems: A 12 point ROS was performed; pertinent positives and negatives were noted in the HPI   Objective:  Physical Exam: There were no vitals filed for this visit. Constitutional: Patient is a  well-developed and well-nourished female in no acute distress and cooperative with exam.   Head: Normocephalic and atraumatic Eyes: PERRL, EOMI, conjunctivae normal, No scleral icterus.  Neck: Supple, Trachea midline normal ROM Cardiovascular: RRR, no MRG, pulses symmetric and intact bilaterally Pulmonary/Chest: Normal respiratory effort, CTAB, no wheezes, rales, or rhonchi Abdominal: Soft. Non-tender, non-distended, bowel sounds are normal Musculoskeletal: Moves all 4 extremities Neurological: A&O x3, no focal neurological deficit Psychiatric: Pressured speech, conversation is tangential at times.  Skin: 2cm nevus on right cheek, near lower eyelid. Covered with makeup, on removal, boarders and pigmentation regular throughout, non-raised.  Assessment & Plan:   Please refer to Problem List based Assessment and Plan

## 2013-08-09 NOTE — Patient Instructions (Signed)
Be sure to get [facility] as soon as possible, and once you qualify, I will refer you for your colonoscopy and mammogram.  I will also refer you for your pelvic exam and pap smear.   [facility] If you do not hear anything from the clinic or Behavioral Health within 2 weeks, please call our clinic.

## 2013-08-11 ENCOUNTER — Encounter: Payer: Self-pay | Admitting: Internal Medicine

## 2013-08-11 NOTE — Assessment & Plan Note (Addendum)
Pt with RLE DVT in '12 and was tx for about 71mo on Coumadin and with a recurrence in Jan '15. She is currently on Coumadin. INR therapeutic at 2.9 on 07/16/13. Given that she has had a DVT recurrence, she will require life-long Coumadin. Pt is aware and understands this.

## 2013-08-11 NOTE — Assessment & Plan Note (Signed)
H/o depression and significant anxiety. She has tried Paxil and Celexa in the past but does not like how they made her feel. On exam, her speech is pressured and some what tangential. I am concerned that she may have bipolar disorder. Referring to Psychiatry for close outpatient counseling and treatment.

## 2013-08-11 NOTE — Assessment & Plan Note (Signed)
H/o depression and significant anxiety. She has tried Paxil and Celexa in the past but does not like how they made her feel. She states that she continues to have significant anxiety made worse by her husband, which makes her smoke more. She continues to request Xanax as she reports that this is the only thing that has helped her in the past. I feel that she needs to be seen by a mental health professional. Referring to Psychiatry for close outpatient counseling and treatment. [name] is amenable to this.

## 2013-08-12 NOTE — Progress Notes (Signed)
Case discussed with Dr. Glenn soon after the resident saw the patient.  We reviewed the resident's history and exam and pertinent patient test results.  I agree with the assessment, diagnosis, and plan of care documented in the resident's note. 

## 2013-08-13 ENCOUNTER — Ambulatory Visit (INDEPENDENT_AMBULATORY_CARE_PROVIDER_SITE_OTHER): Payer: No Typology Code available for payment source | Admitting: Pharmacist

## 2013-08-13 ENCOUNTER — Ambulatory Visit: Payer: No Typology Code available for payment source

## 2013-08-13 DIAGNOSIS — I82401 Acute embolism and thrombosis of unspecified deep veins of right lower extremity: Secondary | ICD-10-CM

## 2013-08-13 DIAGNOSIS — I82409 Acute embolism and thrombosis of unspecified deep veins of unspecified lower extremity: Secondary | ICD-10-CM

## 2013-08-13 LAB — POCT INR: INR: 2.1

## 2013-08-13 NOTE — Progress Notes (Signed)
Anti-Coagulation Progress Note  Marie CargoSusan Willis is a 58 y.o. female who is currently on an anti-coagulation regimen.    RECENT RESULTS: Recent results are below, the most recent result is correlated with a dose of 27.5 mg. per week: Lab Results  Component Value Date   INR 2.10 08/13/2013   INR 2.90 07/16/2013   INR 2.3 06/25/2013    ANTI-COAG DOSE: Anticoagulation Dose Instructions as of 08/13/2013     Glynis SmilesSun Mon Tue Wed Thu Fri Sat   New Dose 5 mg 5 mg 5 mg 2.5 mg 5 mg 2.5 mg 5 mg       ANTICOAG SUMMARY: Anticoagulation Episode Summary   Current INR goal 2.0-3.0  Next INR check 08/27/2013  INR from last check 2.10 (08/13/2013)  Weekly max dose   Target end date Indefinite  [facility]  Preferred lab   Send INR reminders to    Indications  DVT (deep venous thrombosis) [453.40]        [facility] for [facility]. 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        ANTICOAG TODAY: Anticoagulation Summary as of 08/13/2013   INR goal 2.0-3.0  Selected INR 2.10 (08/13/2013)  Next INR check 08/27/2013  Target end date Indefinite   Indications  DVT (deep venous thrombosis) [453.40]      Anticoagulation Episode Summary   [facility]   Preferred lab    Send INR reminders to    Comments [facility] for D[facility]. 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      PATIENT INSTRUCTIONS: Patient Instructions  Patient instructed to take medications as defined in the Anti-coagulation Track section of this encounter.  Patient instructed to take today's dose.  Patient verbalized understanding of these  instructions.       FOLLOW-UP Return in 2 weeks (on 08/27/2013) for Follow up INR at 3:30PM.  Hulen LusterJames Acen Craun, [name] Pharm.D., CACP

## 2013-08-13 NOTE — Patient Instructions (Signed)
Patient instructed to take medications as defined in the Anti-coagulation Track section of this encounter.  Patient instructed to take today's dose.  Patient verbalized understanding of these instructions.    

## 2013-08-14 NOTE — Progress Notes (Signed)
Indication: Recurrent venous thromboembolism. Duration: Lifelong. INR: At target. Agree with Dr. Groce's assessment and plan. 

## 2013-08-27 ENCOUNTER — Ambulatory Visit (INDEPENDENT_AMBULATORY_CARE_PROVIDER_SITE_OTHER): Payer: No Typology Code available for payment source | Admitting: Pharmacist

## 2013-08-27 DIAGNOSIS — I82401 Acute embolism and thrombosis of unspecified deep veins of right lower extremity: Secondary | ICD-10-CM

## 2013-08-27 DIAGNOSIS — I82409 Acute embolism and thrombosis of unspecified deep veins of unspecified lower extremity: Secondary | ICD-10-CM

## 2013-08-27 LAB — POCT INR: INR: 2.1

## 2013-08-27 NOTE — Patient Instructions (Signed)
Patient instructed to take medications as defined in the Anti-coagulation Track section of this encounter.  Patient instructed to take today's dose.  Patient verbalized understanding of these instructions.    

## 2013-08-27 NOTE — Progress Notes (Signed)
Anti-Coagulation Progress Note  Marie Willis is a 58 y.o. female who is currently on an anti-coagulation regimen.    RECENT RESULTS: Recent results are below, the most recent result is correlated with a dose of 32.5 mg. per week: Lab Results  Component Value Date   INR 2.10 08/27/2013   INR 2.10 08/13/2013   INR 2.90 07/16/2013    ANTI-COAG DOSE: Anticoagulation Dose Instructions as of 08/27/2013     Glynis SmilesSun Mon Tue Wed Thu Fri Sat   New Dose 5 mg 5 mg 5 mg 5 mg 5 mg 2.5 mg 5 mg       ANTICOAG SUMMARY: Anticoagulation Episode Summary   Current INR goal 2.0-3.0  Next INR check 10/01/2013  INR from last check 2.10 (08/27/2013)  Weekly max dose   Target end date Indefinite  [facility]  Preferred lab   Send INR reminders to    Indications  DVT (deep venous thrombosis) [453.40]        [facility]. 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        ANTICOAG TODAY: Anticoagulation Summary as of 08/27/2013   INR goal 2.0-3.0  Selected INR 2.10 (08/27/2013)  Next INR check 10/01/2013  Target end date Indefinite   Indications  DVT (deep venous thrombosis) [453.40]      Anticoagulation Episode Summary   [facility]   Preferred lab    Send INR reminders to    [facility]. 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      PATIENT INSTRUCTIONS: Patient Instructions    Patient instructed to take medications as defined in the Anti-coagulation Track section of this encounter.  Patient instructed to take today's dose.  Patient verbalized understanding of these  instructions.       FOLLOW-UP Return in 5 weeks (on 10/01/2013) for Follow up INR at 3:30PM.  Hulen LusterJames Bulmaro Feagans, [name] Pharm.D., CACP

## 2013-08-31 ENCOUNTER — Other Ambulatory Visit: Payer: Self-pay | Admitting: *Deleted

## 2013-08-31 ENCOUNTER — Other Ambulatory Visit: Payer: Self-pay | Admitting: Oncology

## 2013-08-31 MED ORDER — MORPHINE SULFATE 30 MG PO TABS: 30.0000 mg | ORAL_TABLET | Freq: Four times a day (QID) | ORAL | Status: DC | PRN

## 2013-09-03 ENCOUNTER — Ambulatory Visit: Payer: No Typology Code available for payment source | Admitting: Family Medicine

## 2013-09-06 ENCOUNTER — Telehealth: Payer: Self-pay | Admitting: Licensed Clinical Social Worker

## 2013-09-06 NOTE — Telephone Encounter (Signed)
Ms. Marie Willis was [facility] to CSW [facility]  CSW placed called to pt.  CSW left message requesting return call. CSW provided contact hours and phone number.  Behavioral Health as new referral forms, awaiting receipt of fax.

## 2013-09-07 NOTE — Telephone Encounter (Signed)
CSW placed called to pt.  CSW left message requesting return call. CSW provided contact hours and phone number. 

## 2013-09-10 ENCOUNTER — Ambulatory Visit: Payer: No Typology Code available for payment source | Admitting: Family Medicine

## 2013-09-10 NOTE — Telephone Encounter (Signed)
CSW placed called to pt.  CSW left message requesting return call. CSW provided contact hours and phone number. 

## 2013-09-11 NOTE — Telephone Encounter (Signed)
No response back from pt.  Letter mailed.  CSW will sign off.

## 2013-09-25 ENCOUNTER — Other Ambulatory Visit: Payer: Self-pay | Admitting: *Deleted

## 2013-09-25 MED ORDER — MORPHINE SULFATE 30 MG PO TABS: 30.0000 mg | ORAL_TABLET | Freq: Four times a day (QID) | ORAL | Status: DC | PRN

## 2013-09-25 NOTE — Telephone Encounter (Signed)
I have refilled this morphine today as she has been on it at the same dose for at least 3 years and I do not know her.  I cannot find compelling evidence in our EPIC record for a diagnosis requiring so strong an analgesic regimen.  I will contact Dr. Sherrine Maples and also Dr. Rogelia Boga to suggest a formal review of the appropriateness of this chronic prescription.

## 2013-09-26 NOTE — Telephone Encounter (Signed)
Rx ready to be pick up - pt called; no answer, message left.

## 2013-10-01 ENCOUNTER — Ambulatory Visit (INDEPENDENT_AMBULATORY_CARE_PROVIDER_SITE_OTHER): Payer: No Typology Code available for payment source | Admitting: Pharmacist

## 2013-10-01 DIAGNOSIS — I82409 Acute embolism and thrombosis of unspecified deep veins of unspecified lower extremity: Secondary | ICD-10-CM

## 2013-10-01 DIAGNOSIS — I82401 Acute embolism and thrombosis of unspecified deep veins of right lower extremity: Secondary | ICD-10-CM

## 2013-10-01 LAB — POCT INR: INR: 3.4

## 2013-10-01 NOTE — Patient Instructions (Signed)
Patient instructed to take medications as defined in the Anti-coagulation Track section of this encounter.  Patient instructed to take today's dose.  Patient verbalized understanding of these instructions.    

## 2013-10-01 NOTE — Progress Notes (Signed)
Anti-Coagulation Progress Note  Marie Willis is a 58 y.o. female who is currently on an anti-coagulation regimen.    RECENT RESULTS: Recent results are below, the most recent result is correlated with a dose of 32.5 mg. per week: Lab Results  Component Value Date   INR 3.40 10/01/2013   INR 2.10 08/27/2013   INR 2.10 08/13/2013    ANTI-COAG DOSE: Anticoagulation Dose Instructions as of 10/01/2013     [name] Tue Wed Thu Fri Sat   New Dose 5 mg 2.5 mg 5 mg 5 mg 5 mg 2.5 mg 5 mg       ANTICOAG SUMMARY: Anticoagulation Episode Summary   Current INR goal 2.0-3.0  Next INR check 10/29/2013  INR from last check 3.40! (10/01/2013)  Weekly max dose   Target end date Indefinite  [facility]  Preferred lab   Send INR reminders to    Indications  DVT (deep venous thrombosis) [453.40]        Comments Commenced being seen in the [facility] 16-SEP-12 after an acute admission for DVT. 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        ANTICOAG TODAY: Anticoagulation Summary as of 10/01/2013   INR goal 2.0-3.0  Selected INR 3.40! (10/01/2013)  Next INR check 10/29/2013  Target end date Indefinite   Indications  DVT (deep venous thrombosis) [453.40]      Anticoagulation Episode Summary   [facility]   Preferred lab    Send INR reminders to    Comments Commenced being seen in the [facility]-SEP[facility]12 after an acute admission for DVT. 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      PATIENT INSTRUCTIONS: Patient Instructions  Patient instructed to take medications as defined in the Anti-coagulation Track section of this encounter.  Patient instructed to take today's dose.  Patient verbalized understanding of these  instructions.       FOLLOW-UP Return in 4 weeks (on 10/29/2013) for Follow up INR at 3:30PM.  Hulen Luster, [name] Pharm.D., CACP

## 2013-10-09 ENCOUNTER — Encounter: Payer: Self-pay | Admitting: Internal Medicine

## 2013-10-25 ENCOUNTER — Other Ambulatory Visit: Payer: Self-pay | Admitting: *Deleted

## 2013-10-29 ENCOUNTER — Other Ambulatory Visit: Payer: Self-pay | Admitting: Internal Medicine

## 2013-10-29 ENCOUNTER — Ambulatory Visit (INDEPENDENT_AMBULATORY_CARE_PROVIDER_SITE_OTHER): Payer: No Typology Code available for payment source | Admitting: Pharmacist

## 2013-10-29 DIAGNOSIS — I82409 Acute embolism and thrombosis of unspecified deep veins of unspecified lower extremity: Secondary | ICD-10-CM

## 2013-10-29 DIAGNOSIS — I82401 Acute embolism and thrombosis of unspecified deep veins of right lower extremity: Secondary | ICD-10-CM

## 2013-10-29 LAB — POCT INR: INR: 2.4

## 2013-10-29 MED ORDER — MORPHINE SULFATE 30 MG PO TABS: 30.0000 mg | ORAL_TABLET | Freq: Four times a day (QID) | ORAL | Status: DC | PRN

## 2013-10-29 NOTE — Patient Instructions (Signed)
Patient instructed to take medications as defined in the Anti-coagulation Track section of this encounter.  Patient instructed to take today's dose.  Patient verbalized understanding of these instructions.    

## 2013-10-29 NOTE — Progress Notes (Signed)
Anti-Coagulation Progress Note  Marie Willis is a 58 y.o. female who is currently on an anti-coagulation regimen.    RECENT RESULTS: Recent results are below, the most recent result is correlated with a dose of 30 mg. per week: Lab Results  Component Value Date   INR 2.40 10/29/2013   INR 3.40 10/01/2013   INR 2.10 08/27/2013    ANTI-COAG DOSE: Anticoagulation Dose Instructions as of 10/29/2013     Marie SmilesSun Mon Tue Wed Thu Fri Sat   New Dose 5 mg 2.5 mg 5 mg 5 mg 5 mg 2.5 mg 5 mg       ANTICOAG SUMMARY: Anticoagulation Episode Summary   Current INR goal 2.0-3.0  Next INR check 12/03/2013  INR from last check 2.40 (10/29/2013)  Weekly max dose   Target end date Indefinite  [facility]  Preferred lab   Send INR reminders to    Indications  DVT (deep venous thrombosis) [453.40]        Comments [facility]. 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        ANTICOAG TODAY: Anticoagulation Summary as of 10/29/2013   INR goal 2.0-3.0  Selected INR 2.40 (10/29/2013)  Next INR check 12/03/2013  Target end date Indefinite   Indications  DVT (deep venous thrombosis) [453.40]      Anticoagulation Episode Summary   [facility]   Preferred lab    Send INR reminders to    [facility] for [facility]. 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      PATIENT INSTRUCTIONS: Patient Instructions  Patient instructed to take medications as defined in the Anti-coagulation Track section of this encounter.  Patient instructed to take today's dose.  Patient verbalized understanding of these instructions.        FOLLOW-UP Return in 5 weeks (on 12/03/2013) for Follow up INR at 3:45PM.  Hulen LusterJames Timothee Gali, [name] Pharm.D., CACP

## 2013-10-30 NOTE — Progress Notes (Signed)
Pt aware.

## 2013-10-30 NOTE — Progress Notes (Signed)
INTERNAL MEDICINE TEACHING ATTENDING ADDENDUM - Kveon Casanas M.D  Duration- indefinite, Indication- recurrent DVT, INR- therapeutic. Agree with Dr. Groce's recommendations as outlined in his note.      

## 2013-11-26 ENCOUNTER — Other Ambulatory Visit: Payer: Self-pay | Admitting: *Deleted

## 2013-11-26 ENCOUNTER — Other Ambulatory Visit: Payer: Self-pay | Admitting: Internal Medicine

## 2013-11-26 DIAGNOSIS — M5126 Other intervertebral disc displacement, lumbar region: Secondary | ICD-10-CM

## 2013-11-26 MED ORDER — MORPHINE SULFATE 30 MG PO TABS: 30.0000 mg | ORAL_TABLET | Freq: Four times a day (QID) | ORAL | Status: DC | PRN

## 2013-11-27 NOTE — Telephone Encounter (Signed)
Pt was called and informed she and spouse will need to present for scripts this month per dr's mullen and narendra, she is agreeable

## 2013-11-30 ENCOUNTER — Other Ambulatory Visit: Payer: No Typology Code available for payment source

## 2013-11-30 DIAGNOSIS — M5126 Other intervertebral disc displacement, lumbar region: Secondary | ICD-10-CM

## 2013-12-01 LAB — PRESCRIPTION ABUSE MONITORING 15P, URINE
Amphetamine/Meth: NEGATIVE ng/mL
BARBITURATE SCREEN, URINE: NEGATIVE ng/mL
BUPRENORPHINE, URINE: NEGATIVE ng/mL
CANNABINOID SCRN UR: NEGATIVE ng/mL
CARISOPRODOL, URINE: NEGATIVE ng/mL
Cocaine Metabolites: NEGATIVE ng/mL
Creatinine, Urine: 145.57 mg/dL (ref >20.0)
FENTANYL URINE: NEGATIVE ng/mL
Meperidine, Ur: NEGATIVE ng/mL
Methadone Screen, Urine: NEGATIVE ng/mL
Oxycodone Screen, Ur: NEGATIVE ng/mL
Propoxyphene: NEGATIVE ng/mL
TRAMADOL UR: NEGATIVE ng/mL
Zolpidem, Urine: NEGATIVE ng/mL

## 2013-12-03 ENCOUNTER — Ambulatory Visit (INDEPENDENT_AMBULATORY_CARE_PROVIDER_SITE_OTHER): Payer: No Typology Code available for payment source | Admitting: Pharmacist

## 2013-12-03 DIAGNOSIS — I82409 Acute embolism and thrombosis of unspecified deep veins of unspecified lower extremity: Secondary | ICD-10-CM

## 2013-12-03 DIAGNOSIS — Z7901 Long term (current) use of anticoagulants: Secondary | ICD-10-CM

## 2013-12-03 LAB — BENZODIAZEPINES (GC/LC/MS), URINE
Alprazolam metabolite (GC/LC/MS), ur confirm: 42 ng/mL — ABNORMAL HIGH (ref <25)
Clonazepam metabolite (GC/LC/MS), ur confirm: NEGATIVE ng/mL (ref <25)
FLURAZEPAMU: NEGATIVE ng/mL (ref <50)
Lorazepam (GC/LC/MS), ur confirm: NEGATIVE ng/mL (ref <50)
MIDAZOLAMU: NEGATIVE ng/mL (ref <50)
NORDIAZEPAMU: NEGATIVE ng/mL (ref <50)
Oxazepam (GC/LC/MS), ur confirm: NEGATIVE ng/mL (ref <50)
Temazepam (GC/LC/MS), ur confirm: NEGATIVE ng/mL (ref <50)
Triazolam metabolite (GC/LC/MS), ur confirm: NEGATIVE ng/mL (ref <50)

## 2013-12-03 LAB — OPIATES/OPIOIDS (LC/MS-MS)
CODEINE URINE: NEGATIVE ng/mL (ref <50)
HYDROMORPHONE: 815 ng/mL — AB (ref <50)
Hydrocodone: NEGATIVE ng/mL (ref <50)
Morphine Urine: >75000 ng/mL — ABNORMAL HIGH (ref <50)
NORHYDROCODONE, UR: NEGATIVE ng/mL (ref <50)
Noroxycodone, Ur: NEGATIVE ng/mL (ref <50)
OXYMORPHONE, URINE: NEGATIVE ng/mL (ref <50)
Oxycodone, ur: NEGATIVE ng/mL (ref <50)

## 2013-12-03 LAB — POCT INR: INR: 2.1

## 2013-12-03 NOTE — Progress Notes (Signed)
Anti-Coagulation Progress Note  Marie Willis is a 58 y.o. female who is currently on an anti-coagulation Marie Willis.    RECENT RESULTS: Recent results are below, the most recent result is correlated with a dose of 30 mg. per week: Lab Results  Component Value Date   INR 2.10 12/03/2013   INR 2.40 10/29/2013   INR 3.40 10/01/2013    ANTI-COAG DOSE: Anticoagulation Dose Instructions as of 12/03/2013     Marie SmilesSun Mon Tue Wed Thu Fri Sat   New Dose 5 mg 5 mg 5 mg 2.5 mg 5 mg 5 mg 5 mg       ANTICOAG SUMMARY: Anticoagulation Episode Summary   Current INR goal 2.0-3.0  Next INR check 12/24/2013  INR from last check 2.10 (12/03/2013)  Weekly max dose   Target end date Indefinite  [facility]  Preferred lab   Send INR reminders to    Indications  DVT (deep venous thrombosis) [453.40]        Comments [facility]. 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        ANTICOAG TODAY: Anticoagulation Summary as of 12/03/2013   INR goal 2.0-3.0  Selected INR 2.10 (12/03/2013)  Next INR check 12/24/2013  Target end date Indefinite   Indications  DVT (deep venous thrombosis) [453.40]      Anticoagulation Episode Summary   [facility]   Preferred lab    Send INR reminders to    Comments [facility] for DVT. 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      PATIENT INSTRUCTIONS: Patient Instructions  Patient instructed to take medications as defined in the Anti-coagulation Track section of this encounter.  Patient instructed to take today's dose.  Patient verbalized understanding of these  instructions.       FOLLOW-UP Return in 3 weeks (on 12/24/2013) for Follow up INR at 3PM.  Marie Willis, [name] Pharm.D., CACP

## 2013-12-03 NOTE — Patient Instructions (Signed)
Patient instructed to take medications as defined in the Anti-coagulation Track section of this encounter.  Patient instructed to take today's dose.  Patient verbalized understanding of these instructions.    

## 2013-12-24 ENCOUNTER — Ambulatory Visit (INDEPENDENT_AMBULATORY_CARE_PROVIDER_SITE_OTHER): Payer: No Typology Code available for payment source | Admitting: Pharmacist

## 2013-12-24 DIAGNOSIS — Z86718 Personal history of other venous thrombosis and embolism: Secondary | ICD-10-CM

## 2013-12-24 DIAGNOSIS — Z7902 Long term (current) use of antithrombotics/antiplatelets: Secondary | ICD-10-CM

## 2013-12-24 DIAGNOSIS — I82409 Acute embolism and thrombosis of unspecified deep veins of unspecified lower extremity: Secondary | ICD-10-CM

## 2013-12-24 LAB — POCT INR: INR: 2.8

## 2013-12-24 NOTE — Patient Instructions (Signed)
Patient instructed to take medications as defined in the Anti-coagulation Track section of this encounter.  Patient instructed to take today's dose.  Patient verbalized understanding of these instructions.    

## 2013-12-24 NOTE — Progress Notes (Signed)
Anti-Coagulation Progress Note  Marie Willis is a 58 y.o. female who is currently on an anti-coagulation regimen.    RECENT RESULTS: Recent results are below, the most recent result is correlated with a dose of 32.5 mg. per week: Lab Results  Component Value Date   INR 2.80 12/24/2013   INR 2.10 12/03/2013   INR 2.40 10/29/2013    ANTI-COAG DOSE: Anticoagulation Dose Instructions as of 12/24/2013     [name] Tue Wed Thu Fri Sat   New Dose 5 mg 5 mg 5 mg 2.5 mg 5 mg 5 mg 5 mg       ANTICOAG SUMMARY: Anticoagulation Episode Summary   Current INR goal 2.0-3.0  Next INR check 01/21/2014  INR from last check 2.80 (12/24/2013)  Weekly max dose   Target end date Indefinite  [facility]  Preferred lab   Send INR reminders to    Indications  DVT (deep venous thrombosis) [453.40]        Comments Commenced being seen in the [facility] 16-SEP-12 after an acute admission for DVT. 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        ANTICOAG TODAY: Anticoagulation Summary as of 12/24/2013   INR goal 2.0-3.0  Selected INR 2.80 (12/24/2013)  Next INR check 01/21/2014  Target end date Indefinite   Indications  DVT (deep venous thrombosis) [453.40]      Anticoagulation Episode Summary   [facility]   Preferred lab    Send INR reminders to    Comments Commenced being seen in the [facility] 16-SEP-12 after an acute admission for DVT. 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      PATIENT INSTRUCTIONS: Patient Instructions  Patient instructed to take medications as defined in the Anti-coagulation Track section of this encounter.  Patient instructed to take today's dose.  Patient verbalized understanding of these  instructions.       FOLLOW-UP Return in 4 weeks (on 01/21/2014) for Follow up INR at 3PM.  Hulen Luster, [name] Pharm.D., CACP

## 2013-12-26 NOTE — Progress Notes (Signed)
Indication: Recurrent DVTs. Duration: Lifelong. INR: At target. Agree with Dr. Saralyn Pilar assessment and plan.

## 2014-01-02 ENCOUNTER — Ambulatory Visit (INDEPENDENT_AMBULATORY_CARE_PROVIDER_SITE_OTHER): Payer: No Typology Code available for payment source | Admitting: Internal Medicine

## 2014-01-02 ENCOUNTER — Encounter: Payer: No Typology Code available for payment source | Admitting: Internal Medicine

## 2014-01-02 ENCOUNTER — Encounter: Payer: Self-pay | Admitting: Internal Medicine

## 2014-01-02 VITALS — BP 118/70 | HR 77 | Temp 98.4°F

## 2014-01-02 DIAGNOSIS — F411 Generalized anxiety disorder: Secondary | ICD-10-CM

## 2014-01-02 DIAGNOSIS — I82401 Acute embolism and thrombosis of unspecified deep veins of right lower extremity: Secondary | ICD-10-CM

## 2014-01-02 DIAGNOSIS — Z8659 Personal history of other mental and behavioral disorders: Secondary | ICD-10-CM

## 2014-01-02 DIAGNOSIS — G894 Chronic pain syndrome: Secondary | ICD-10-CM

## 2014-01-02 DIAGNOSIS — I82409 Acute embolism and thrombosis of unspecified deep veins of unspecified lower extremity: Secondary | ICD-10-CM

## 2014-01-02 DIAGNOSIS — F17200 Nicotine dependence, unspecified, uncomplicated: Secondary | ICD-10-CM

## 2014-01-02 DIAGNOSIS — Z0000 Encounter for general adult medical examination without abnormal findings: Secondary | ICD-10-CM

## 2014-01-02 DIAGNOSIS — M5126 Other intervertebral disc displacement, lumbar region: Secondary | ICD-10-CM

## 2014-01-02 DIAGNOSIS — I10 Essential (primary) hypertension: Secondary | ICD-10-CM

## 2014-01-02 DIAGNOSIS — M542 Cervicalgia: Secondary | ICD-10-CM

## 2014-01-02 DIAGNOSIS — Z23 Encounter for immunization: Secondary | ICD-10-CM

## 2014-01-02 HISTORY — DX: Chronic pain syndrome: G89.4

## 2014-01-02 LAB — BASIC METABOLIC PANEL WITH GFR
BUN: 20 mg/dL (ref 6–23)
CO2: 24 meq/L (ref 19–32)
Calcium: 9.3 mg/dL (ref 8.4–10.5)
Chloride: 103 mEq/L (ref 96–112)
Creat: 1.64 mg/dL — ABNORMAL HIGH (ref 0.50–1.10)
GFR, EST AFRICAN AMERICAN: 40 mL/min — AB
GFR, Est Non African American: 35 mL/min — ABNORMAL LOW
Glucose, Bld: 107 mg/dL — ABNORMAL HIGH (ref 70–99)
POTASSIUM: 4.7 meq/L (ref 3.5–5.3)
Sodium: 136 mEq/L (ref 135–145)

## 2014-01-02 LAB — LIPID PANEL
Cholesterol: 183 mg/dL (ref 0–200)
HDL: 32 mg/dL — AB (ref >39)
LDL Cholesterol: 102 mg/dL — ABNORMAL HIGH (ref 0–99)
Total CHOL/HDL Ratio: 5.7 Ratio
Triglycerides: 246 mg/dL — ABNORMAL HIGH (ref <150)
VLDL: 49 mg/dL — ABNORMAL HIGH (ref 0–40)

## 2014-01-02 NOTE — Assessment & Plan Note (Signed)
Chronic pain in cervical and lumbar spine. MRIs in '11 with some cervical foraminal narrowing and lumbar spine showing Status post bilateral hemilaminectomies at L4-5; Right foraminal stenosis at L4-5 due to asymmetric rightward  disc bulging and facet hypertrophy; Right foraminal stenosis at L5-S1 due to facet spurring; Minimal disc bulging contributes. She is taking MSIR  q6h PRN, but states that this is much lower than what she was previously on by [facility] and states that her current regimen barely touches her pain. Pt states that the pain is worsening and is at the worst at the end of the day. She does experience radiculopathy at times down the back of her legs. On exam, good ROM at lumbar spine, diminished left lateral ROM of cervical spine. UDS + for alprazolam will continue with the pain contract for now but will need to repeat in the next few months. If her UDS is + again, her pain contract will be voided as she is not prescribed alprazolam and is taking it from elsewhere.  - Repeating MRI of cervical and lumbar spine - Continue MSIR  q6h PRN for now.  - Will need to repeat UDS at some point in the next few months.

## 2014-01-02 NOTE — Assessment & Plan Note (Signed)
On Coumadin and follows regularly with Dr. Alexandria Lodge. Last INR 2.8 on 12/24/13.

## 2014-01-02 NOTE — Assessment & Plan Note (Signed)
Flu vaccine, checking lipid panel and BMP 01/02/14.  Referral placed for GYN for pelvic exam 9/9//15 Order for mammogram placed 01/02/14, pt instructed to call [facility] to schedule, number given.

## 2014-01-02 NOTE — Patient Instructions (Signed)
General Instructions:   Please bring your medicines with you each time you come to clinic.  Medicines may include prescription medications, over-the-counter medications, herbal remedies, eye drops, vitamins, or other pills.   Progress Toward Treatment Goals:  Treatment Goal 01/02/2014  Blood pressure at goal  Stop smoking smoking the same amount  Prevent falls -    Self Care Goals & Plans:  Self Care Goal 08/09/2013  Manage my medications take my medicines as prescribed; bring my medications to every visit; refill my medications on time; follow the sick day instructions if I am sick  Monitor my health keep track of my blood pressure  Eat healthy foods eat more vegetables; eat fruit for snacks and desserts; eat baked foods instead of fried foods; eat smaller portions; drink diet soda or water instead of juice or soda  Be physically active find an activity I enjoy  Stop smoking -    No flowsheet data found.   Care Management & Community Referrals:  No flowsheet data found.
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
## 2014-01-02 NOTE — Assessment & Plan Note (Addendum)
BP Readings from Last 3 Encounters:  01/02/14 118/70  05/02/13 115/74  04/27/13 105/66    Lab Results  Component Value Date   NA 139 04/27/2013   K 4.2 04/27/2013   CREATININE 1.42* 04/27/2013    Assessment: Blood pressure control: controlled Progress toward BP goal:  at goal   Plan: Medications:  continue current medications: Lisinopril  daily Educational resources provided:   Self management tools provided:   Other plans: Checking BMP today to assess renal function and K

## 2014-01-02 NOTE — Assessment & Plan Note (Signed)
Significant anxiety and pt reports a h/o depression. We have tried Wellbutrin, Paxil, and most recently Celexa; none of which have been effective, per pt. She states that she has been on other antidepressants in the past which have also not been effective.  - Psychiatry referral, prefer [facility] to keep the pt in the [facility].  - CSW consult for assistance with mental health resources.

## 2014-01-02 NOTE — Assessment & Plan Note (Signed)
Significant anxiety and pt reports a h/o depression. We have tried Wellbutrin, Paxil, and most recently Celexa; none of which have been effective, per pt. She states that she has been on other antidepressants in the past which have also not been effective. She was not interested in starting other medications, but she states that Xanax has been helpful in the past. She states that she is interested in seeing a behavioral health specialist. However, at her last visit, our social worker tried to contact the patient regarding mental health resources and but unable to get in touch with her.  Of note, UDS + for alprazolam in August '15, but she is not prescribed Xanax.  Will re-refer to Psychiatry and Social Work. - Psychiatry referral, prefer [facility] to keep the pt in the [facility].  - CSW consult for assistance with mental health resources.

## 2014-01-02 NOTE — Assessment & Plan Note (Addendum)
Pt c/o worsening pain. Last MRI was in '11. Checking lumbar MRI. Will continue MSIR for now, but with such large doses, she will likely benefit from a Pain Specialist.

## 2014-01-02 NOTE — Assessment & Plan Note (Addendum)
  Assessment: Progress toward smoking cessation:  smoking the same amount (1/2-2ppd, increaseed amount with anxiety) Barriers to progress toward smoking cessation:  stress;other tobacco users at home   Plan: Instruction/counseling given:  I counseled patient on the dangers of tobacco use, advised patient to stop smoking, and reviewed strategies to maximize success.  Use appears related to her anxiety. I feel that if we can get the anxiety under control, her smoking would decrease.

## 2014-01-03 NOTE — Progress Notes (Signed)
Case discussed with Dr. Glenn at the time of the visit.  We reviewed the resident's history and exam and pertinent patient test results.  I agree with the assessment, diagnosis, and plan of care documented in the resident's note.   

## 2014-01-04 ENCOUNTER — Telehealth: Payer: Self-pay | Admitting: Licensed Clinical Social Worker

## 2014-01-04 NOTE — Progress Notes (Signed)
Message left on home phone ID recording  - both Cx and  lumbar MRI sch together [facility] 01/18/14 3PM - no restrictions. Stanton Kidney Teia Freitas RN 01/04/14 2PM

## 2014-01-04 NOTE — Telephone Encounter (Signed)
Marie Willis was referred to CSW for referral to [facility].  CSW will confirm with pt regarding referral to [facility].  CSW placed called to pt.  CSW left message requesting return call. CSW provided contact hours and phone number.

## 2014-01-08 ENCOUNTER — Encounter: Payer: Self-pay | Admitting: Obstetrics & Gynecology

## 2014-01-16 NOTE — Telephone Encounter (Signed)
CSW placed called to pt.  CSW left message requesting return call. CSW provided contact hours and phone number. 

## 2014-01-17 NOTE — Telephone Encounter (Signed)
CSW placed called to pt.  CSW left message requesting return call. CSW provided contact hours and phone number.  No response back from Ms. Su Hilt.  CSW will send letter and sign off.

## 2014-01-18 ENCOUNTER — Ambulatory Visit (HOSPITAL_COMMUNITY): Payer: No Typology Code available for payment source

## 2014-01-21 ENCOUNTER — Ambulatory Visit: Payer: No Typology Code available for payment source

## 2014-01-28 ENCOUNTER — Ambulatory Visit: Payer: No Typology Code available for payment source

## 2014-01-28 ENCOUNTER — Ambulatory Visit (INDEPENDENT_AMBULATORY_CARE_PROVIDER_SITE_OTHER): Payer: No Typology Code available for payment source | Admitting: Pharmacist

## 2014-01-28 DIAGNOSIS — I82401 Acute embolism and thrombosis of unspecified deep veins of right lower extremity: Secondary | ICD-10-CM

## 2014-01-28 DIAGNOSIS — I82409 Acute embolism and thrombosis of unspecified deep veins of unspecified lower extremity: Secondary | ICD-10-CM

## 2014-01-28 DIAGNOSIS — Z7901 Long term (current) use of anticoagulants: Secondary | ICD-10-CM

## 2014-01-28 LAB — POCT INR: INR: 2.8

## 2014-01-28 MED ORDER — WARFARIN SODIUM 5 MG PO TABS: ORAL_TABLET | ORAL | Status: DC

## 2014-01-28 NOTE — Addendum Note (Signed)
Addended by: Armandina StammerBATCHELDER, Gerritt Galentine J on: 01/28/2014 04:11 PM   Modules accepted: Orders

## 2014-01-28 NOTE — Progress Notes (Signed)
Marie Willis is a 58 y.o. female who is currently on an anti-coagulation regimen.    RECENT RESULTS: Recent results are below, the most recent result is correlated with a dose of 32.5 mg. per week: Lab Results  Component Value Date   INR 2.8 01/28/2014   INR 2.80 12/24/2013   INR 2.10 12/03/2013    ANTI-COAG DOSE: Anticoagulation Dose Instructions as of 01/28/2014     Glynis SmilesSun Mon Tue Wed Thu Fri Sat   New Dose 5 mg 5 mg 5 mg 2.5 mg 5 mg 5 mg 5 mg       ANTICOAG SUMMARY: Anticoagulation Episode Summary   Current INR goal 2.0-3.0  Next INR check 02/25/2014  INR from last check 2.8 (01/28/2014)  Weekly max dose   Target end date Indefinite  [facility]  Preferred lab   Send INR reminders to    Indications  DVT (deep venous thrombosis) [I82.409]        Comments [facility]. 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        ANTICOAG TODAY: Anticoagulation Summary as of 01/28/2014   INR goal 2.0-3.0  Selected INR 2.8 (01/28/2014)  Next INR check 02/25/2014  Target end date Indefinite   Indications  DVT (deep venous thrombosis) [I82.409]      Anticoagulation Episode Summary   [facility]   Preferred lab    Send INR reminders to    Comments [facility] for [facility]. 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      PATIENT INSTRUCTIONS: Patient Instructions  Patient instructed to take medications as defined in the Anti-coagulation Track section of this encounter.  Patient instructed to take today's dose.  Patient verbalized understanding of these instructions.      FOLLOW-UP Return  in 4 weeks (on 02/25/2014) for Follow up INR at 3:00.  Enzo BiNathan Delorean Knutzen, PharmD PGY1 Pharmacy Resident

## 2014-01-28 NOTE — Progress Notes (Signed)
Indication: Recurrent deep venous thrombosis. Duration: Lifelong. INR: At target. Agree with Dr. Groce's assessment and plan. 

## 2014-01-28 NOTE — Patient Instructions (Signed)
Patient instructed to take medications as defined in the Anti-coagulation Track section of this encounter.  Patient instructed to take today's dose.  Patient verbalized understanding of these instructions.    

## 2014-01-29 ENCOUNTER — Ambulatory Visit (HOSPITAL_COMMUNITY): Admission: RE | Admit: 2014-01-29 | Payer: No Typology Code available for payment source | Source: Ambulatory Visit

## 2014-01-29 ENCOUNTER — Ambulatory Visit (HOSPITAL_COMMUNITY): Payer: No Typology Code available for payment source

## 2014-02-11 ENCOUNTER — Ambulatory Visit: Payer: No Typology Code available for payment source | Admitting: Family Medicine

## 2014-02-25 ENCOUNTER — Ambulatory Visit: Payer: Self-pay

## 2014-02-25 ENCOUNTER — Other Ambulatory Visit: Payer: Self-pay | Admitting: *Deleted

## 2014-02-27 ENCOUNTER — Encounter: Payer: Self-pay | Admitting: Obstetrics & Gynecology

## 2014-02-27 MED ORDER — MORPHINE SULFATE 30 MG PO TABS: 30.0000 mg | ORAL_TABLET | Freq: Four times a day (QID) | ORAL | Status: DC | PRN

## 2014-02-27 MED ORDER — MORPHINE SULFATE 30 MG PO TABS
30.0000 mg | ORAL_TABLET | Freq: Four times a day (QID) | ORAL | Status: DC | PRN
Start: 2014-02-27 — End: 2014-02-27

## 2014-03-04 ENCOUNTER — Ambulatory Visit (INDEPENDENT_AMBULATORY_CARE_PROVIDER_SITE_OTHER): Payer: Medicaid Other | Admitting: Pharmacist

## 2014-03-04 DIAGNOSIS — I82409 Acute embolism and thrombosis of unspecified deep veins of unspecified lower extremity: Secondary | ICD-10-CM

## 2014-03-04 LAB — POCT INR: INR: 3.9

## 2014-03-04 NOTE — Patient Instructions (Signed)
Patient instructed to take medications as defined in the Anti-coagulation Track section of this encounter.  Patient instructed to take today's dose.  Patient verbalized understanding of these instructions.    

## 2014-03-04 NOTE — Progress Notes (Signed)
Anti-Coagulation Progress Note  Marie Willis is a 58 y.o. female who is currently on an anti-coagulation regimen.    RECENT RESULTS: Recent results are below, the most recent result is correlated with a dose of 32.5 mg. per week: Lab Results  Component Value Date   INR 3.90 03/04/2014   INR 2.8 01/28/2014   INR 2.80 12/24/2013    ANTI-COAG DOSE: Anticoagulation Dose Instructions as of 03/04/2014      Marie SmilesSun Mon Tue Wed Thu Fri Sat   New Dose 5 mg 2.5 mg 5 mg 2.5 mg 5 mg 5 mg 5 mg       ANTICOAG SUMMARY: Anticoagulation Episode Summary    Current INR goal 2.0-3.0  Next INR check 03/25/2014  INR from last check 3.90! (03/04/2014)  Weekly max dose   Target end date Indefinite  [facility]  Preferred lab   Send INR reminders to    Indications  DVT (deep venous thrombosis) [I82.409]        Comments [facility]. 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        ANTICOAG TODAY: Anticoagulation Summary as of 03/04/2014    INR goal 2.0-3.0  Selected INR 3.90! (03/04/2014)  Next INR check 03/25/2014  Target end date Indefinite   Indications  DVT (deep venous thrombosis) [I82.409]      Anticoagulation Episode Summary    [facility]   Preferred lab    Send INR reminders to    Comments [facility]. 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      PATIENT INSTRUCTIONS: Patient Instructions  Patient instructed to take medications as defined in the Anti-coagulation Track section of this encounter.  Patient instructed to take today's dose.  Patient verbalized understanding of  these instructions.       FOLLOW-UP Return in 3 weeks (on 03/25/2014) for Follow up INR at 3PM.  Hulen LusterJames Zacherie Honeyman, [name] Pharm.D., CACP  Anti-Coagulation Progress Note  Marie Willis is a 58 y.o. female who is currently on an anti-coagulation regimen.    RECENT RESULTS: Recent results are below, the most recent result is correlated with a dose of 32.5 mg. per week: Lab Results  Component Value Date   INR 3.90 03/04/2014   INR 2.8 01/28/2014   INR 2.80 12/24/2013    ANTI-COAG DOSE: Anticoagulation Dose Instructions as of 03/04/2014      Marie SmilesSun Mon Tue Wed Thu Fri Sat   New Dose 5 mg 2.5 mg 5 mg 2.5 mg 5 mg 5 mg 5 mg       ANTICOAG SUMMARY: Anticoagulation Episode Summary    Current INR goal 2.0-3.0  Next INR check 03/25/2014  INR from last check 3.90! (03/04/2014)  Weekly max dose   Target end date Indefinite  [facility]  Preferred lab   Send INR reminders to    Indications  DVT (deep venous thrombosis) [I82.409]        Comments [facility]. 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  ANTICOAG TODAY: Anticoagulation Summary as of 03/04/2014    INR goal 2.0-3.0  Selected INR 3.90! (03/04/2014)  Next INR check 03/25/2014  Target end date Indefinite   Indications  DVT (deep venous thrombosis) [I82.409]      Anticoagulation Episode Summary    [facility]   Preferred lab    Send INR reminders to    Comments [facility]. 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      PATIENT INSTRUCTIONS: Patient Instructions  Patient instructed to take medications as  defined in the Anti-coagulation Track section of this encounter.  Patient instructed to take today's dose.  Patient verbalized understanding of these instructions.       FOLLOW-UP Return in 3 weeks (on 03/25/2014) for Follow up INR at 3PM.  Hulen LusterJames Kimberlly Norgard, [name] Pharm.D., CACP

## 2014-03-05 ENCOUNTER — Ambulatory Visit (HOSPITAL_COMMUNITY): Admission: RE | Admit: 2014-03-05 | Payer: Medicaid Other | Source: Ambulatory Visit

## 2014-03-05 ENCOUNTER — Ambulatory Visit (HOSPITAL_COMMUNITY)
Admission: RE | Admit: 2014-03-05 | Discharge: 2014-03-05 | Disposition: A | Discharge status: Home / Self Care | Payer: Medicaid Other | Source: Ambulatory Visit | Attending: Internal Medicine | Admitting: Internal Medicine

## 2014-03-05 DIAGNOSIS — M5126 Other intervertebral disc displacement, lumbar region: Secondary | ICD-10-CM

## 2014-03-05 DIAGNOSIS — M4802 Spinal stenosis, cervical region: Secondary | ICD-10-CM | POA: Diagnosis not present

## 2014-03-05 DIAGNOSIS — M1288 Other specific arthropathies, not elsewhere classified, other specified site: Secondary | ICD-10-CM | POA: Insufficient documentation

## 2014-03-05 DIAGNOSIS — M542 Cervicalgia: Secondary | ICD-10-CM | POA: Diagnosis present

## 2014-03-05 DIAGNOSIS — R531 Weakness: Secondary | ICD-10-CM | POA: Diagnosis present

## 2014-03-05 NOTE — Progress Notes (Signed)
INTERNAL MEDICINE TEACHING ATTENDING ADDENDUM - Miklo Aken M.D  Duration- indefinite, Indication- recurrent DVT, INR- supratherapeutic. Agree with Dr. Groce's recommendations as outlined in his note.      

## 2014-03-06 ENCOUNTER — Encounter: Payer: Self-pay | Admitting: Obstetrics & Gynecology

## 2014-03-25 ENCOUNTER — Ambulatory Visit (INDEPENDENT_AMBULATORY_CARE_PROVIDER_SITE_OTHER): Payer: Medicaid Other | Admitting: Pharmacist

## 2014-03-25 DIAGNOSIS — I82409 Acute embolism and thrombosis of unspecified deep veins of unspecified lower extremity: Secondary | ICD-10-CM

## 2014-03-25 LAB — POCT INR
INR: 2.7
INR: 2.7
INR: 2.7
INR: 2.7

## 2014-03-25 NOTE — Patient Instructions (Signed)
Patient instructed to take medications as defined in the Anti-coagulation Track section of this encounter.  Patient instructed to take today's dose.  Patient verbalized understanding of these instructions.    

## 2014-03-28 ENCOUNTER — Other Ambulatory Visit: Payer: Self-pay | Admitting: Internal Medicine

## 2014-03-28 DIAGNOSIS — G894 Chronic pain syndrome: Secondary | ICD-10-CM

## 2014-04-04 NOTE — Addendum Note (Signed)
Addended by: Neomia DearPOWERS, Estelene Carmack E on: 04/04/2014 06:28 PM   Modules accepted: Orders

## 2014-04-15 ENCOUNTER — Ambulatory Visit: Payer: Medicaid Other

## 2014-04-22 ENCOUNTER — Ambulatory Visit (INDEPENDENT_AMBULATORY_CARE_PROVIDER_SITE_OTHER): Payer: Medicaid Other | Admitting: Pharmacist

## 2014-04-22 ENCOUNTER — Ambulatory Visit: Payer: Medicaid Other

## 2014-04-22 DIAGNOSIS — I82409 Acute embolism and thrombosis of unspecified deep veins of unspecified lower extremity: Secondary | ICD-10-CM

## 2014-04-22 DIAGNOSIS — Z7901 Long term (current) use of anticoagulants: Secondary | ICD-10-CM

## 2014-04-22 LAB — POCT INR: INR: 2.6

## 2014-04-22 NOTE — Patient Instructions (Signed)
Patient instructed to take medications as defined in the Anti-coagulation Track section of this encounter.  Patient instructed to take today's dose.  Patient verbalized understanding of these instructions.    

## 2014-04-22 NOTE — Progress Notes (Signed)
Anti-Coagulation Progress Note  Marie Willis is a 58 y.o. female who reports to the clinic for monitoring of anticoagulation treatment.    RECENT RESULTS: Recent results are below, the most recent result is correlated with a dose of 30 mg. per week: Lab Results  Component Value Date   INR 2.6 04/22/2014   INR 2.7 03/25/2014   INR 2.7 03/25/2014   ANTI-COAG DOSE: INR as of 04/22/2014 and Previous Dosing Information    INR Dt INR Goal [facility] Tot Sun Mon Tue Wed Thu Fri Sat   04/22/2014 2.6 2.0-3.0 30 mg 5 mg 2.5 mg 5 mg 2.5 mg 5 mg 5 mg 5 mg    Anticoagulation Dose Instructions as of 04/22/2014      Total Sun Mon Tue Wed Thu Fri Sat   New Dose 30 mg 5 mg 2.5 mg 5 mg 2.5 mg 5 mg 5 mg 5 mg     (5 mg x 1)  (5 mg x 0.5)  (5 mg x 1)  (5 mg x 0.5)  (5 mg x 1)  (5 mg x 1)  (5 mg x 1)                           ANTICOAG SUMMARY: Anticoagulation Episode Summary    Current INR goal 2.0-3.0  Next INR check 05/20/2014  INR from last check 2.6 (04/22/2014)  Weekly max dose   Target end date Indefinite  [facility]  Preferred lab   Send INR reminders to    Indications  DVT (deep venous thrombosis) [I82.409]        Comments [facility] for [facility]VT. 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       PATIENT INSTRUCTIONS: Patient Instructions  Patient instructed to take medications as defined in the Anti-coagulation Track section of this encounter.  Patient instructed to take today's dose.  Patient verbalized understanding of these instructions.       FOLLOW-UP Return in 4 weeks (on 05/20/2014) for Follow up INR at 3:30.  Enzo BiNathan Kate Larock, PharmD Clinical Pharmacist Resident Pager [phone] 04/22/2014 3:55 PM

## 2014-04-25 ENCOUNTER — Other Ambulatory Visit: Payer: Self-pay | Admitting: Internal Medicine

## 2014-05-02 ENCOUNTER — Encounter: Payer: Medicaid Other | Admitting: Internal Medicine

## 2014-05-16 ENCOUNTER — Encounter: Payer: Medicaid Other | Admitting: Internal Medicine

## 2014-05-20 ENCOUNTER — Ambulatory Visit (INDEPENDENT_AMBULATORY_CARE_PROVIDER_SITE_OTHER): Payer: Medicaid Other | Admitting: Pharmacist

## 2014-05-20 DIAGNOSIS — Z7901 Long term (current) use of anticoagulants: Secondary | ICD-10-CM

## 2014-05-20 DIAGNOSIS — I82409 Acute embolism and thrombosis of unspecified deep veins of unspecified lower extremity: Secondary | ICD-10-CM

## 2014-05-20 LAB — POCT INR: INR: 2.7

## 2014-05-20 NOTE — Progress Notes (Signed)
Anti-Coagulation Progress Note  Marie Willis is a 59 y.o. female who is currently on an anti-coagulation regimen.    RECENT RESULTS: Recent results are below, the most recent result is correlated with a dose of 30 mg. per week: Lab Results  Component Value Date   INR 2.70 05/20/2014   INR 2.6 04/22/2014   INR 2.7 03/25/2014    ANTI-COAG DOSE: Anticoagulation Dose Instructions as of 05/20/2014      Glynis SmilesSun Mon Tue Wed Thu Fri Sat   New Dose 5 mg 2.5 mg 5 mg 2.5 mg 5 mg 5 mg 5 mg       ANTICOAG SUMMARY: Anticoagulation Episode Summary    Current INR goal 2.0-3.0  Next INR check 06/17/2014  INR from last check 2.70 (05/20/2014)  Weekly max dose   Target end date Indefinite  [facility]  Preferred lab   Send INR reminders to    Indications  DVT (deep venous thrombosis) [I82.409]        Comments [facility] for DVT. 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        ANTICOAG TODAY: Anticoagulation Summary as of 05/20/2014    INR goal 2.0-3.0  Selected INR 2.70 (05/20/2014)  Next INR check 06/17/2014  Target end date Indefinite   Indications  DVT (deep venous thrombosis) [I82.409]      Anticoagulation Episode Summary    [facility]   Preferred lab    Send INR reminders to    Comments [facility] for [facility]. 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      PATIENT INSTRUCTIONS: Patient Instructions  Patient instructed to take medications as defined in the Anti-coagulation Track section of this encounter.  Patient instructed to take today's dose.  Patient verbalized understanding of these  instructions.        FOLLOW-UP Return in 4 weeks (on 06/17/2014) for Follow-up INR @ 1500.   Russ HaloAshley Zayli Villafuerte, PharmD Clinical Pharmacist - Resident Pager: [phone] 1/25/20163:20 PM

## 2014-05-20 NOTE — Patient Instructions (Signed)
Patient instructed to take medications as defined in the Anti-coagulation Track section of this encounter.  Patient instructed to take today's dose.  Patient verbalized understanding of these instructions.    

## 2014-05-26 ENCOUNTER — Other Ambulatory Visit: Payer: Self-pay | Admitting: Internal Medicine

## 2014-05-28 ENCOUNTER — Other Ambulatory Visit: Payer: Self-pay | Admitting: *Deleted

## 2014-05-28 DIAGNOSIS — Z5181 Encounter for therapeutic drug level monitoring: Secondary | ICD-10-CM

## 2014-05-29 ENCOUNTER — Other Ambulatory Visit: Payer: Medicaid Other

## 2014-05-29 DIAGNOSIS — G894 Chronic pain syndrome: Secondary | ICD-10-CM

## 2014-05-29 MED ORDER — MORPHINE SULFATE 30 MG PO TABS: 30.0000 mg | ORAL_TABLET | Freq: Four times a day (QID) | ORAL | Status: DC | PRN

## 2014-05-30 LAB — PRESCRIPTION ABUSE MONITORING 15P, URINE
AMPHETAMINE/METH: NEGATIVE ng/mL
BARBITURATE SCREEN, URINE: NEGATIVE ng/mL
Buprenorphine, Urine: NEGATIVE ng/mL
CARISOPRODOL, URINE: NEGATIVE ng/mL
COCAINE METABOLITES: NEGATIVE ng/mL
Cannabinoid Scrn, Ur: NEGATIVE ng/mL
Creatinine, Urine: 81.15 mg/dL (ref >20.0)
Fentanyl, Ur: NEGATIVE ng/mL
MEPERIDINE UR: NEGATIVE ng/mL
Methadone Screen, Urine: NEGATIVE ng/mL
Oxycodone Screen, Ur: NEGATIVE ng/mL
Propoxyphene: NEGATIVE ng/mL
Tramadol Scrn, Ur: NEGATIVE ng/mL
Zolpidem, Urine: NEGATIVE ng/mL

## 2014-05-31 ENCOUNTER — Telehealth: Payer: Self-pay | Admitting: Internal Medicine

## 2014-05-31 NOTE — Telephone Encounter (Signed)
Call to patient to confirm appointment for 06/03/14 at 3:15. LMTCB ° °

## 2014-06-03 ENCOUNTER — Ambulatory Visit (INDEPENDENT_AMBULATORY_CARE_PROVIDER_SITE_OTHER): Payer: Medicaid Other | Admitting: Internal Medicine

## 2014-06-03 ENCOUNTER — Encounter: Payer: Self-pay | Admitting: Internal Medicine

## 2014-06-03 VITALS — BP 137/83 | HR 64 | Temp 99.3°F | Ht 66.0 in | Wt 206.7 lb

## 2014-06-03 DIAGNOSIS — I10 Essential (primary) hypertension: Secondary | ICD-10-CM

## 2014-06-03 DIAGNOSIS — G894 Chronic pain syndrome: Secondary | ICD-10-CM

## 2014-06-03 LAB — BENZODIAZEPINES (GC/LC/MS), URINE
Alprazolam metabolite (GC/LC/MS), ur confirm: 40 ng/mL — ABNORMAL HIGH (ref <25)
Clonazepam metabolite (GC/LC/MS), ur confirm: NEGATIVE ng/mL (ref <25)
FLURAZEPAMU: NEGATIVE ng/mL (ref <50)
Lorazepam (GC/LC/MS), ur confirm: NEGATIVE ng/mL (ref <50)
MIDAZOLAMU: NEGATIVE ng/mL (ref <50)
Nordiazepam (GC/LC/MS), ur confirm: NEGATIVE ng/mL (ref <50)
Oxazepam (GC/LC/MS), ur confirm: NEGATIVE ng/mL (ref <50)
TEMAZEPAMU: NEGATIVE ng/mL (ref <50)
Triazolam metabolite (GC/LC/MS), ur confirm: NEGATIVE ng/mL (ref <50)

## 2014-06-03 LAB — OPIATES/OPIOIDS (LC/MS-MS)
CODEINE URINE: NEGATIVE ng/mL (ref <50)
Hydrocodone: NEGATIVE ng/mL (ref <50)
Hydromorphone: 6664 ng/mL — ABNORMAL HIGH (ref <50)
Morphine Urine: >75000 ng/mL — ABNORMAL HIGH (ref <50)
NOROXYCODONE, UR: NEGATIVE ng/mL (ref <50)
Norhydrocodone, Ur: NEGATIVE ng/mL (ref <50)
Oxycodone, ur: NEGATIVE ng/mL (ref <50)
Oxymorphone: NEGATIVE ng/mL (ref <50)

## 2014-06-03 NOTE — Assessment & Plan Note (Signed)
BP Readings from Last 3 Encounters:  06/03/14 137/83  01/02/14 118/70  05/02/13 115/74    Lab Results  Component Value Date   NA 136 01/02/2014   K 4.7 01/02/2014   CREATININE 1.64* 01/02/2014    Assessment: Blood pressure control:   Progress toward BP goal:    Comments: well controlled   Plan: Medications:  continue current medications that include lisinopril 20mg  qd Educational resources provided: brochure Self management tools provided:  (denies) Other plans: pt said that she needed to catch her transportation and therefore left before any labs could be performed. Pt will be following up with PCP in 1 month.

## 2014-06-03 NOTE — Progress Notes (Signed)
Case discussed with Dr. Sadek at the time of the visit.  We reviewed the resident's history and exam and pertinent patient test results.  I agree with the assessment, diagnosis, and plan of care documented in the resident's note. 

## 2014-06-03 NOTE — Patient Instructions (Signed)
General Instructions:   Please bring your medicines with you each time you come to clinic.  Medicines may include prescription medications, over-the-counter medications, herbal remedies, eye drops, vitamins, or other pills.   Progress Toward Treatment Goals:  Treatment Goal 01/02/2014  Blood pressure at goal  Stop smoking smoking the same amount  Prevent falls -    Self Care Goals & Plans:  Self Care Goal 06/03/2014  Manage my medications take my medicines as prescribed; bring my medications to every visit; refill my medications on time  Monitor my health -  Eat healthy foods drink diet soda or water instead of juice or soda; eat more vegetables; eat foods that are low in salt; eat baked foods instead of fried foods; eat fruit for snacks and desserts  Be physically active -  Stop smoking call [facility] (1-800-QUIT-NOW)    No flowsheet data found.   Care Management & Community Referrals:  No flowsheet data found.

## 2014-06-03 NOTE — Progress Notes (Signed)
   Subjective:   Patient ID: Marie Willis female   DOB: 09/08/1955 59 y.o.   MRN: [id number]  HPI: Marie Willis is a 59 y.o. woman pmh as listed below presents for HTN recheck.   Hypertension ROS: taking medications as instructed, no medication side effects noted, no TIA's, no chest pain on exertion, no dyspnea on exertion and no swelling of ankles.   New concerns: the patient wanted to review her MRI results that had been previously performed. She states that many of her symptoms are unchanged that sometimes include bilateral upper extremity paresthesias that resolve with rest and change in position. She has had no exacerbation of her lower extremity paresthesias, saddle anesthesia, or bowel or bladder incontinence.   Past Medical History  Diagnosis Date  . Anxiety   . Depression   . Hypertension   . Low back pain    Current Outpatient Prescriptions  Medication Sig Dispense Refill  . aspirin 325 MG tablet Take 325 mg by mouth daily.    . cetirizine (ZYRTEC) 10 MG tablet Take 10 mg by mouth daily.    Marland Kitchen. lisinopril (PRINIVIL,ZESTRIL) 20 MG tablet TAKE 1 TABLET BY MOUTH ONCE DAILY 30 tablet 0  . morphine (MSIR) 30 MG tablet Take 1 tablet (30 mg total) by mouth every 6 (six) hours as needed for moderate pain or severe pain. Not to exceed 3 tablets daily 90 tablet 0  . oxymetazoline (AFRIN) 0.05 % nasal spray Place 1 spray into both nostrils daily as needed for congestion.    Marland Kitchen. warfarin (COUMADIN) 5 MG tablet TAKE 1 TABLET BY MOUTH ONCE DAILY EXCEPT 1/2 TABLET ON WEDNESDAY AS DIRECTED 30 tablet 11   No current facility-administered medications for this visit.   No family history on file. History   Social History  . Marital Status: Married    Spouse Name: N/A    Number of Children: N/A  . Years of Education: N/A   Occupational History  . former hairdresser    Social History Main Topics  . Smoking status: Current Every Day Smoker -- 1.00 packs/day    Types: Cigarettes  .  Smokeless tobacco: Never Used     Comment: Stress lately.  . Alcohol Use: No  . Drug Use: No  . Sexual Activity: None   Other Topics Concern  . None   Social History Narrative   Former Interior and spatial designerhairdresser, currently unemployed. Lives at home with husband Marie Willis and 3 dogs.      Financial assistance approved by [place] [place] [place] [facility] and has [facility], Nov 4th 2011.   Review of Systems: Pertinent items are noted in HPI. Objective:  Physical Exam: Filed Vitals:   06/03/14 1529  BP: 137/83  Pulse: 64  Temp: 99.3 F (37.4 C)  TempSrc: Oral  Height: 5\' 6"  (1.676 m)  Weight: 206 lb 11.2 oz (93.759 kg)  SpO2: 100%   Appearance alert, well appearing, and in no distress. General exam BP noted to be well controlled today in office, S1, S2 normal, no gallop, no murmur, chest clear, no JVD, no HSM, no edema.  Lab review: labs are reviewed and normal.   Assessment & Plan:  Please see problem oriented charting  Pt discussed with Dr. Dalphine HandingBhardwaj

## 2014-06-03 NOTE — Assessment & Plan Note (Signed)
Apparently there is concern that the patient is violating pain contract by taking her husband's benzos. Previously per chart review the patient has had positive UDS for alprazolam although she is not prescribed this medication. This finding was found after the patient left the visit in order to address the continued violation and possible withdrawal of her pain contract. -Will follow-up with PCP in one month

## 2014-06-14 ENCOUNTER — Telehealth: Payer: Self-pay | Admitting: Pharmacist

## 2014-06-14 NOTE — Telephone Encounter (Signed)
Call to patient to confirm appt for 06/17/14 at 3:00. lmtcb

## 2014-06-17 ENCOUNTER — Ambulatory Visit: Payer: Medicaid Other

## 2014-06-20 ENCOUNTER — Telehealth: Payer: Self-pay | Admitting: Pharmacist

## 2014-06-20 NOTE — Telephone Encounter (Signed)
Call to patient to confirm appointment for 06/24/14 at 3:00 lmtcb

## 2014-06-22 ENCOUNTER — Other Ambulatory Visit: Payer: Self-pay | Admitting: Internal Medicine

## 2014-06-24 ENCOUNTER — Ambulatory Visit (INDEPENDENT_AMBULATORY_CARE_PROVIDER_SITE_OTHER): Payer: Medicaid Other | Admitting: Pharmacist

## 2014-06-24 DIAGNOSIS — I82409 Acute embolism and thrombosis of unspecified deep veins of unspecified lower extremity: Secondary | ICD-10-CM

## 2014-06-24 LAB — POCT INR: INR: 1.9

## 2014-06-24 NOTE — Progress Notes (Signed)
Anti-Coagulation Progress Note  Marie CargoSusan Willis is a 59 y.o. female who is currently on an anti-coagulation regimen.  RECENT RESULTS:  Recent results are below, the most recent result is correlated with a dose of 30 mg. per week:  Lab Results  Component Value Date   INR 1.9 06/24/2014   INR 2.70 05/20/2014   INR 2.6 04/22/2014    ANTI-COAG DOSE:  Anticoagulation Dose Instructions as of 06/24/2014      Glynis SmilesSun Mon Tue Wed Thu Fri Sat   New Dose 5 mg 5 mg 5 mg 5 mg 5 mg 5 mg 5 mg      ANTICOAG SUMMARY:  Anticoagulation Episode Summary    Current INR goal 2.0-3.0  Next INR check 07/15/2014  INR from last check 1.9! (06/24/2014)  Weekly max dose   Target end date Indefinite  [facility]  Preferred lab   Send INR reminders to    Indications  DVT (deep venous thrombosis) [I82.409]        Comments [facility] for DVT. 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        ANTICOAG TODAY:  Anticoagulation Summary as of 06/24/2014    INR goal 2.0-3.0  Selected INR 1.9! (06/24/2014)  Next INR check 07/15/2014  Target end date Indefinite   Indications  DVT (deep venous thrombosis) [I82.409]      Anticoagulation Episode Summary    [facility]   Preferred lab    Send INR reminders to    Comments [facility] for [facility]VT. 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      PATIENT INSTRUCTIONS:  Patient Instructions  Patient instructed to take medications as defined in the Anti-coagulation Track section of this encounter.  Patient instructed to take today's dose.  Patient verbalized understanding of these  instructions.        FOLLOW-UP  Return in 3 weeks (on 07/15/2014) for Follow up INR at 3:15pm.    Thank you for allowing pharmacy to be part of this patient's care team  Gearld Kerstein M. Sanvi Ehler, Pharm.D Clinical Pharmacy Resident Pager: [phone] 06/24/2014 .3:43 PM

## 2014-06-24 NOTE — Patient Instructions (Signed)
Patient instructed to take medications as defined in the Anti-coagulation Track section of this encounter.  Patient instructed to take today's dose.  Patient verbalized understanding of these instructions.    

## 2014-06-25 ENCOUNTER — Other Ambulatory Visit: Payer: Self-pay | Admitting: *Deleted

## 2014-06-25 NOTE — Telephone Encounter (Signed)
Last refill 2/3 Pt will get on Thursday

## 2014-06-27 ENCOUNTER — Encounter: Payer: Self-pay | Admitting: Internal Medicine

## 2014-06-27 ENCOUNTER — Ambulatory Visit (INDEPENDENT_AMBULATORY_CARE_PROVIDER_SITE_OTHER): Payer: Medicaid Other | Admitting: Internal Medicine

## 2014-06-27 VITALS — BP 123/82 | HR 82 | Temp 98.1°F | Ht 65.0 in | Wt 205.5 lb

## 2014-06-27 DIAGNOSIS — N183 Chronic kidney disease, stage 3 unspecified: Secondary | ICD-10-CM | POA: Insufficient documentation

## 2014-06-27 DIAGNOSIS — Z7901 Long term (current) use of anticoagulants: Secondary | ICD-10-CM

## 2014-06-27 DIAGNOSIS — G894 Chronic pain syndrome: Secondary | ICD-10-CM

## 2014-06-27 HISTORY — DX: Chronic kidney disease, stage 3 unspecified: N18.30

## 2014-06-27 LAB — BASIC METABOLIC PANEL WITH GFR
BUN: 17 mg/dL (ref 6–23)
CO2: 24 mEq/L (ref 19–32)
CREATININE: 1.35 mg/dL — AB (ref 0.50–1.10)
Calcium: 9.3 mg/dL (ref 8.4–10.5)
Chloride: 103 mEq/L (ref 96–112)
GFR, EST AFRICAN AMERICAN: 51 mL/min — AB
GFR, Est Non African American: 44 mL/min — ABNORMAL LOW
GLUCOSE: 141 mg/dL — AB (ref 70–99)
POTASSIUM: 4.5 meq/L (ref 3.5–5.3)
Sodium: 136 mEq/L (ref 135–145)

## 2014-06-27 LAB — CBC
HCT: 40.7 % (ref 36.0–46.0)
Hemoglobin: 13.9 g/dL (ref 12.0–15.0)
MCH: 29 pg (ref 26.0–34.0)
MCHC: 34.2 g/dL (ref 30.0–36.0)
MCV: 84.8 fL (ref 78.0–100.0)
MPV: 9.2 fL (ref 8.6–12.4)
Platelets: 248 10*3/uL (ref 150–400)
RBC: 4.8 MIL/uL (ref 3.87–5.11)
RDW: 14.3 % (ref 11.5–15.5)
WBC: 6.3 10*3/uL (ref 4.0–10.5)

## 2014-06-27 MED ORDER — MELOXICAM 15 MG PO TABS: 15.0000 mg | ORAL_TABLET | Freq: Every day | ORAL | Status: DC

## 2014-06-27 NOTE — Assessment & Plan Note (Signed)
Patient has not had a CBC since '12 and is on chronic Coumadin.  - Checking CBC today

## 2014-06-27 NOTE — Progress Notes (Signed)
Patient ID: Marie Willis, female   DOB: 11/19/1955, 59 y.o.   MRN: [id number]  Subjective:   Patient ID: Marie Willis female   DOB: 05/03/1955 59 y.o.   MRN: [id number]  HPI: [name] is a 59 y.o. F w/ PMH chronic pain, anxiety, and recurrent DVTs presents for a routine f/u.  A UDS was performed last month that was positive for Xanax, which the patient is not prescribed. This was her 2nd UDS that was positive for Xanax. She has violated her pain contract. She is requesting to be referred to a pain specialist. She is also requesting a referral to Orthopedics for her neck and back pain. She is not interested in P at this time. MRI cervical spine showed no herniation, few minor disc buldges, no stenosis of the central canal, and facet arthropathy more pronounced on left at C2-3, C3-4, and C4-5. She does c/o intermittent pain in her neck which primarily radiates down the right side, not the left. The MRI of the lumbar spine was declined by her insurance.    Past Medical History  Diagnosis Date  . Anxiety   . Depression   . Hypertension   . Low back pain    Current Outpatient Prescriptions  Medication Sig Dispense Refill  . aspirin 325 MG tablet Take 325 mg by mouth daily.    . cetirizine (ZYRTEC) 10 MG tablet Take 10 mg by mouth daily.    Marland Kitchen. lisinopril (PRINIVIL,ZESTRIL) 20 MG tablet TAKE 1 TABLET BY MOUTH ONCE DAILY 30 tablet 6  . morphine (MSIR) 30 MG tablet Take 1 tablet (30 mg total) by mouth every 6 (six) hours as needed for moderate pain or severe pain. Not to exceed 3 tablets daily 90 tablet 0  . oxymetazoline (AFRIN) 0.05 % nasal spray Place 1 spray into both nostrils daily as needed for congestion.    Marland Kitchen. warfarin (COUMADIN) 5 MG tablet TAKE 1 TABLET BY MOUTH ONCE DAILY EXCEPT 1/2 TABLET ON WEDNESDAY AS DIRECTED 30 tablet 11   No current facility-administered medications for this visit.   No family history on file. History   Social History  . Marital Status: Married    Spouse  Name: N/A  . Number of Children: N/A  . Years of Education: N/A   Occupational History  . former hairdresser    Social History Main Topics  . Smoking status: Current Every Day Smoker -- 1.00 packs/day    Types: Cigarettes  . Smokeless tobacco: Never Used     Comment: Stress lately.  . Alcohol Use: No  . Drug Use: No  . Sexual Activity: Not on file   Other Topics Concern  . None   Social History Narrative   Former Interior and spatial designerhairdresser, currently unemployed. Lives at home with husband Edwyna Shellbenny Guitron and 3 dogs.      Financial assistance approved by [place] for [facility] and has [facility] card, Nov 4th 2011.   Review of Systems: A 12 point ROS was performed; pertinent positives and negatives were noted in the HPI   Objective:  Physical Exam: Filed Vitals:   06/27/14 1541  BP: 123/82  Pulse: 82  Temp: 98.1 F (36.7 C)  TempSrc: Oral  Height: 5\' 5"  (1.651 m)  Weight: 205 lb 8 oz (93.214 kg)  SpO2: 100%   Constitutional: Vital signs reviewed.  Patient is a well-developed and well-nourished female in no acute distress and cooperative with exam. Alert and oriented x3.  Head: Normocephalic and atraumatic Eyes: EOMI  Neck: Normal ROM Cardiovascular: RRR, no MRG Pulmonary/Chest: Normal respiratory effort, CTAB, no wheezes, rales, or rhonchi Abdominal: Soft. Non-tender, non-distended, bowel sounds are normal, no guarding.  Musculoskeletal: No joint deformities. ROM seems to be intact, mildly diminished ROM of cervical spine. Hematology: no cervical, inginal, or axillary adenopathy.  Neurological: A&O x3, cranial nerve II-XII are grossly intact, no focal motor deficit  Skin: Warm, dry and intact.   Psychiatric: Pressured speech.   Assessment & Plan:   Please refer to Problem List based Assessment and Plan

## 2014-06-27 NOTE — Assessment & Plan Note (Addendum)
Pt has violated her pain contract, so the clinic will no longer be prescribing controlled substances to her. She was notified and request a referral to a pain specialist.  - Pain management referral  - She was told to try Tylenol for her pain   NSAIDs are contraindicated in the setting of Coumadin use and given her CKD.

## 2014-06-27 NOTE — Progress Notes (Signed)
Medicine attending: Medical history, presenting problems, physical findings, and medications, reviewed with Dr Kathryn Glenn and I concur with her evaluation and management plan. 

## 2014-06-27 NOTE — Assessment & Plan Note (Addendum)
Cr 1.6 12/2013 with slow increase.  - Checking BMP today - Pt advised to avoid NSAIDs for pain control - May need referral to Nephrology

## 2014-06-27 NOTE — Patient Instructions (Addendum)
Try the Tylenol as needed for your pain. I am referring you to a pain specialist.     General Instructions:   Thank you for bringing your medicines today. This helps us keep you safe from mistakes.   Progress Toward Treatment Goals:  Treatment Goal 01/02/2014  Blood pressure at goal  Stop smoking smoking the same amount  Prevent falls -    Self Care Goals & Plans:  Self Care Goal 06/27/2014  Manage my medications take my medicines as prescribed; bring my medications to every visit; refill my medications on time; follow the sick day instructions if I am sick  Monitor my health keep track of my blood pressure  Eat healthy foods eat more vegetables; eat fruit for snacks and desserts; eat baked foods instead of fried foods; eat foods that are low in salt  Be physically active find an activity I enjoy  Stop smoking -    No flowsheet data found.   Care Management & Community Referrals:  No flowsheet data found.

## 2014-07-01 ENCOUNTER — Encounter: Payer: Self-pay | Admitting: *Deleted

## 2014-07-11 ENCOUNTER — Telehealth: Payer: Self-pay | Admitting: Pharmacist

## 2014-07-11 NOTE — Telephone Encounter (Signed)
Call to patient to confirm appointment for 07/15/14 at 3:30 lmtcb

## 2014-07-15 ENCOUNTER — Ambulatory Visit (INDEPENDENT_AMBULATORY_CARE_PROVIDER_SITE_OTHER): Payer: Medicaid Other | Admitting: Pharmacist

## 2014-07-15 DIAGNOSIS — I82409 Acute embolism and thrombosis of unspecified deep veins of unspecified lower extremity: Secondary | ICD-10-CM

## 2014-07-15 LAB — POCT INR: INR: 5.3

## 2014-07-15 NOTE — Patient Instructions (Signed)
Patient instructed to take medications as defined in the Anti-coagulation Track section of this encounter.  Patient instructed NOT to take today's and tomorrow's dose.  Patient verbalized understanding of these instructions.

## 2014-07-15 NOTE — Progress Notes (Signed)
Anti-Coagulation Progress Note  Marie Willis is a 59 y.o. female who is currently on an anti-coagulation regimen.  RECENT RESULTS:  Recent results are below, the most recent result is correlated with a dose of 35 mg. per week:  Lab Results  Component Value Date   INR 5.3 07/15/2014   INR 1.9 06/24/2014   INR 2.70 05/20/2014    ANTI-COAG DOSE:  Anticoagulation Dose Instructions as of 07/15/2014      Glynis SmilesSun Mon Tue Wed Thu Fri Sat   New Dose 5 mg 0 mg 0 mg 2.5 mg 5 mg 2.5 mg 5 mg      ANTICOAG SUMMARY:  Anticoagulation Episode Summary    Current INR goal 2.0-3.0  Next INR check 07/22/2014  INR from last check 5.3! (07/15/2014)  Weekly max dose   Target end date Indefinite  [facility]  Preferred lab   Send INR reminders to    Indications  DVT (deep venous thrombosis) [I82.409]        Comments [facility] for DVT. 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        ANTICOAG TODAY:  Anticoagulation Summary as of 07/15/2014    INR goal 2.0-3.0  Selected INR 5.3! (07/15/2014)  Next INR check 07/22/2014  Target end date Indefinite   Indications  DVT (deep venous thrombosis) [I82.409]      Anticoagulation Episode Summary    [facility]   Preferred lab    Send INR reminders to    Comments [facility] for [facility]. 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      PATIENT INSTRUCTIONS:  Patient Instructions  Patient instructed to take medications as defined in the Anti-coagulation Track section of this encounter.  Patient instructed NOT to take today's and tomorrow's dose.  Patient verbalized  understanding of these instructions.      FOLLOW-UP  Return in about 1 week (around 07/22/2014) for Follow up INR @ 3:30pm.     Thank you for allowing pharmacy to be part of this patient's care team  [name], Pharm.D Clinical Pharmacy Resident Pager: [phone] 07/15/2014 .4:46 PM

## 2014-07-16 NOTE — Progress Notes (Signed)
Indication: Recurrent deep venous thrombosis.  Duration: Lifelong.  INR above target.  Agree with Dr. Nelida MeuseSabat's assessment and plan as documented.

## 2014-07-22 ENCOUNTER — Ambulatory Visit (INDEPENDENT_AMBULATORY_CARE_PROVIDER_SITE_OTHER): Payer: Medicaid Other | Admitting: Pharmacist

## 2014-07-22 DIAGNOSIS — I82409 Acute embolism and thrombosis of unspecified deep veins of unspecified lower extremity: Secondary | ICD-10-CM | POA: Diagnosis not present

## 2014-07-22 DIAGNOSIS — Z7901 Long term (current) use of anticoagulants: Secondary | ICD-10-CM

## 2014-07-22 LAB — POCT INR: INR: 2.3

## 2014-07-22 NOTE — Patient Instructions (Signed)
Patient educated about medication as defined in this encounter and verbalized understanding by repeating back instructions provided.   

## 2014-07-22 NOTE — Progress Notes (Signed)
CLINICAL PHARMACY ANTICOAGULATION MANAGEMENT Marie CargoSusan Willis is a 59 y.o. female who reports to the clinic for monitoring of warfarin treatment.    Indication: DVT Duration: indefinite  Anticoagulation Clinic Visit History: Anticoagulation Episode Summary    Current INR goal 2.0-3.0  Next INR check 08/05/2014  INR from last check 2.3 (07/22/2014)  Weekly max dose   Target end date Indefinite  [facility]  Preferred lab   Send INR reminders to    Indications  DVT (deep venous thrombosis) [I82.409]        Comments [facility] after an acute admission for DVT. 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       ASSESSMENT Recent Results: Recent results are below, the most recent result, held 2 doses past week: Lab Results  Component Value Date   INR 2.3 07/22/2014   INR 5.3 07/15/2014   INR 1.9 06/24/2014    INR today: Therapeutic  Anticoagulation Dosing: INR as of 07/22/2014 and Previous Dosing Information    INR Dt [facility] Goal [facility] Tot Sun Mon Tue Wed Thu Fri Sat   07/22/2014 2.3 2.0-3.0 20 mg 5 mg 0 mg 0 mg 2.5 mg 5 mg 2.5 mg 5 mg    Anticoagulation Dose Instructions as of 07/22/2014      Total Sun Mon Tue Wed Thu Fri Sat   New Dose 27.5 mg 5 mg 2.5 mg 5 mg 2.5 mg 5 mg 2.5 mg 5 mg     (5 mg x 1)  (5 mg x 0.5)  (5 mg x 1)  (5 mg x 0.5)  (5 mg x 1)  (5 mg x 0.5)  (5 mg x 1)                           PLAN Weekly dose was changed to 27.5 mg per week.   Patient Instructions  Patient educated about medication as defined in this encounter and verbalized understanding by repeating back instructions provided.     Follow-up Return in about 2 weeks (around 08/05/2014) for Follow up INR 08/05/14 at 3:30.  Marie Willis, PharmD BCPS, BCACP

## 2014-08-01 ENCOUNTER — Telehealth: Payer: Self-pay | Admitting: Pharmacist

## 2014-08-01 NOTE — Telephone Encounter (Signed)
Call to patient to confirm appointment for 08/05/14 at 3:30 lmtcb

## 2014-08-05 ENCOUNTER — Ambulatory Visit (INDEPENDENT_AMBULATORY_CARE_PROVIDER_SITE_OTHER): Payer: Medicaid Other | Admitting: Pharmacist

## 2014-08-05 DIAGNOSIS — I82409 Acute embolism and thrombosis of unspecified deep veins of unspecified lower extremity: Secondary | ICD-10-CM | POA: Diagnosis present

## 2014-08-05 LAB — POCT INR: INR: 1.7

## 2014-08-05 NOTE — Patient Instructions (Signed)
Patient instructed to take medications as defined in the Anti-coagulation Track section of this encounter.  Patient instructed to take today's dose.  Patient verbalized understanding of these instructions.    

## 2014-08-05 NOTE — Progress Notes (Signed)
Anti-Coagulation Progress Note  Marie CargoSusan Willis is a 59 y.o. female who is currently on an anti-coagulation regimen.  RECENT RESULTS:  Recent results are below, the most recent result is correlated with a dose of 27.5 mg. per week:  Lab Results  Component Value Date   INR 1.7 08/05/2014   INR 2.3 07/22/2014   INR 5.3 07/15/2014    ANTI-COAG DOSE:  Anticoagulation Dose Instructions as of 08/05/2014      Glynis SmilesSun Mon Tue Wed Thu Fri Sat   New Dose 5 mg 5 mg 2.5 mg 5 mg 2.5 mg 5 mg 5 mg      ANTICOAG SUMMARY:  Anticoagulation Episode Summary    Current INR goal 2.0-3.0  Next INR check 08/26/2014  INR from last check 1.7! (08/05/2014)  Weekly max dose   Target end date Indefinite  [facility]  Preferred lab   Send INR reminders to    Indications  DVT (deep venous thrombosis) [I82.409]        Comments [facility]. 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        ANTICOAG TODAY:  Anticoagulation Summary as of 08/05/2014    INR goal 2.0-3.0  Selected INR 1.7! (08/05/2014)  Next INR check 08/26/2014  Target end date Indefinite   Indications  DVT (deep venous thrombosis) [I82.409]      Anticoagulation Episode Summary    [facility]   Preferred lab    Send INR reminders to    Comments [facility] for DVT. 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      PATIENT INSTRUCTIONS:  Patient Instructions  Patient instructed to take medications as defined in the Anti-coagulation Track section of this encounter.  Patient instructed to take  today's dose.  Patient verbalized understanding of these  instructions.      FOLLOW-UP  Return in 3 weeks (on 08/26/2014) for Follow up INR @ 3:30pm.     Thank you for allowing pharmacy to be part of this patient's care team  Bristol Soy M. Lanyah Spengler, Pharm.D Clinical Pharmacy Resident Pager: [phone] 08/05/2014 .3:52 PM

## 2014-08-26 ENCOUNTER — Ambulatory Visit (INDEPENDENT_AMBULATORY_CARE_PROVIDER_SITE_OTHER): Payer: Medicaid Other | Admitting: Pharmacist

## 2014-08-26 DIAGNOSIS — I82509 Chronic embolism and thrombosis of unspecified deep veins of unspecified lower extremity: Secondary | ICD-10-CM

## 2014-08-26 DIAGNOSIS — I82409 Acute embolism and thrombosis of unspecified deep veins of unspecified lower extremity: Secondary | ICD-10-CM

## 2014-08-26 DIAGNOSIS — Z7901 Long term (current) use of anticoagulants: Secondary | ICD-10-CM | POA: Diagnosis not present

## 2014-08-26 LAB — POCT INR: INR: 3.3

## 2014-08-26 NOTE — Patient Instructions (Signed)
Patient instructed to take medications as defined in the Anti-coagulation Track section of this encounter.  Patient instructed to take today's dose.  Patient verbalized understanding of these instructions.    

## 2014-08-26 NOTE — Progress Notes (Signed)
INTERNAL MEDICINE TEACHING ATTENDING ADDENDUM - Jaskirat Schwieger M.D  Duration- indefinite, Indication- recurrent DVT, INR- supratherapeutic. Agree with pharmacy recommendations as outlined in their note.  

## 2014-08-26 NOTE — Progress Notes (Signed)
Anti-Coagulation Progress Note  Marie Willis is a 59 y.o. female who is currently on an anti-coagulation regimen.  RECENT RESULTS:  Recent results are below, the most recent result is correlated with a dose of 30 mg. per week:  Lab Results  Component Value Date   INR 3.3 08/26/2014   INR 1.7 08/05/2014   INR 2.3 07/22/2014    ANTI-COAG DOSE:  Anticoagulation Dose Instructions as of 08/26/2014      Marie SmilesSun Mon Tue Wed Thu Fri Sat   New Dose 5 mg 5 mg 2.5 mg 5 mg 2.5 mg 5 mg 5 mg    Description        No changes made due to diet changes - patient states she has decreased her intake of leafy green vegetables and plans to increase it back this coming week.       ANTICOAG SUMMARY:  Anticoagulation Episode Summary    Current INR goal 2.0-3.0  Next INR check 09/16/2014  INR from last check 3.3! (08/26/2014)  Weekly max dose   Target end date Indefinite  [facility]  Preferred lab   Send INR reminders to    Indications  DVT (deep venous thrombosis) [I82.409]        Comments [facility]. 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        ANTICOAG TODAY:  Anticoagulation Summary as of 08/26/2014    INR goal 2.0-3.0  Selected INR 3.3! (08/26/2014)  Next INR check 09/16/2014  Target end date Indefinite   Indications  DVT (deep venous thrombosis) [I82.409]      Anticoagulation Episode Summary    [facility]   Preferred lab    Send INR reminders to    Comments [facility] acute [facility] for DVT. 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      PATIENT INSTRUCTIONS:  Patient Instructions  Patient instructed  to take medications as defined in the Anti-coagulation Track section of this encounter.  Patient instructed to take today's dose.  Patient verbalized understanding of these instructions.       FOLLOW-UP  Return in 3 weeks (on 09/16/2014) for Follow up INR @ 3:15pm.    Thank you for allowing pharmacy to be part of this patient's care team  [place] Surges M. Emeterio Balke, Pharm.D Clinical Pharmacy Resident Pager: [phone] 08/26/2014 .4:49 PM

## 2014-09-11 ENCOUNTER — Encounter: Payer: Self-pay | Admitting: *Deleted

## 2014-09-12 ENCOUNTER — Telehealth: Payer: Self-pay | Admitting: Pharmacist

## 2014-09-12 NOTE — Telephone Encounter (Signed)
Call to patient to confirm appointment for 09/16/14 at 3:30 lmtcb

## 2014-09-16 ENCOUNTER — Ambulatory Visit (INDEPENDENT_AMBULATORY_CARE_PROVIDER_SITE_OTHER): Payer: Medicaid Other | Admitting: Pharmacist

## 2014-09-16 DIAGNOSIS — Z7901 Long term (current) use of anticoagulants: Secondary | ICD-10-CM | POA: Diagnosis not present

## 2014-09-16 DIAGNOSIS — I82409 Acute embolism and thrombosis of unspecified deep veins of unspecified lower extremity: Secondary | ICD-10-CM | POA: Diagnosis present

## 2014-09-16 LAB — POCT INR: INR: 1.9

## 2014-09-16 NOTE — Progress Notes (Signed)
Anti-Coagulation Progress Note  Marie CargoSusan Willis is a 59 y.o. female who is currently on an anti-coagulation regimen.  RECENT RESULTS:  Recent results are below, the most recent result is correlated with a dose of 30 mg. per week:  Lab Results  Component Value Date   INR 1.9 09/16/2014   INR 3.3 08/26/2014   INR 1.7 08/05/2014    ANTI-COAG DOSE:  Anticoagulation Dose Instructions as of 09/16/2014      Glynis SmilesSun Mon Tue Wed Thu Fri Sat   New Dose 5 mg 5 mg 5 mg 2.5 mg 5 mg 5 mg 5 mg    Description        Take 1 tablet every day except on Wednesdays, take 1/2 tablet.      ANTICOAG SUMMARY:  Anticoagulation Episode Summary    Current INR goal 2.0-3.0  Next INR check 10/07/2014  INR from last check 1.9! (09/16/2014)  Weekly max dose   Target end date Indefinite  [facility]  Preferred lab   Send INR reminders to    Indications  DVT (deep venous thrombosis) [I82.409]        Comments [facility]. 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        ANTICOAG TODAY:  Anticoagulation Summary as of 09/16/2014    INR goal 2.0-3.0  Selected INR 1.9! (09/16/2014)  Next INR check 10/07/2014  Target end date Indefinite   Indications  DVT (deep venous thrombosis) [I82.409]      Anticoagulation Episode Summary    [facility]   Preferred lab    Send INR reminders to    [facility]. 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      PATIENT INSTRUCTIONS:  Patient Instructions  Patient instructed to take medications as defined in the Anti-coagulation Track section of this encounter.   Patient instructed to take today's dose.  Patient verbalized understanding of these instructions.       FOLLOW-UP  Return in 3 weeks (on 10/07/2014) for Follow up INR @ 3:30pm.    Thank you for allowing pharmacy to be part of this patient's care team  Rashard Ryle M. Sevon Rotert, Pharm.D Clinical Pharmacy Resident Pager: [phone] 09/16/2014 .3:44 PM

## 2014-09-16 NOTE — Patient Instructions (Signed)
Patient instructed to take medications as defined in the Anti-coagulation Track section of this encounter.  Patient instructed to take today's dose.  Patient verbalized understanding of these instructions.    

## 2014-09-27 NOTE — Addendum Note (Signed)
Addended by: Neomia DearPOWERS, Jerrell Mangel E on: 09/27/2014 06:03 PM   Modules accepted: Orders

## 2014-10-03 ENCOUNTER — Telehealth: Payer: Self-pay | Admitting: Licensed Clinical Social Worker

## 2014-10-03 ENCOUNTER — Encounter: Payer: Self-pay | Admitting: Licensed Clinical Social Worker

## 2014-10-03 NOTE — Telephone Encounter (Signed)
Ms. Deboe was referred to CSW as pt's Medicaid card continues to list incorrect PCP after attempting to update since April 2016.  There is now a dedicated customer service number to PCP changes.  CSW placed call and left message with [phone] option 2 for pt to contact insurance and update her PCP.  Letter mailed with information above.

## 2014-10-07 ENCOUNTER — Ambulatory Visit (INDEPENDENT_AMBULATORY_CARE_PROVIDER_SITE_OTHER): Payer: Medicaid Other | Admitting: Pharmacist

## 2014-10-07 DIAGNOSIS — Z7901 Long term (current) use of anticoagulants: Secondary | ICD-10-CM

## 2014-10-07 DIAGNOSIS — I82409 Acute embolism and thrombosis of unspecified deep veins of unspecified lower extremity: Secondary | ICD-10-CM

## 2014-10-07 LAB — POCT INR: INR: 2

## 2014-10-07 NOTE — Progress Notes (Signed)
Anti-Coagulation Progress Note  Marie Willis is a 59 y.o. female who is currently on an anti-coagulation regimen.    RECENT RESULTS: Recent results are below, the most recent result is correlated with a dose of 32.5 mg. per week: Lab Results  Component Value Date   INR 2.0 10/07/2014   INR 1.9 09/16/2014   INR 3.3 08/26/2014    ANTI-COAG DOSE: Anticoagulation Dose Instructions as of 10/07/2014      [name] Tue Wed Thu Fri Sat   New Dose 5 mg 7.5 mg 5 mg 5 mg 5 mg 5 mg 5 mg    Description        Take 1 tablet every day except on Wednesdays, take 1/2 tablet.       ANTICOAG SUMMARY: Anticoagulation Episode Summary    Current INR goal 2.0-3.0  Next INR check 11/04/2014  INR from last check 2.0 (10/07/2014)  Weekly max dose   Target end date Indefinite  [facility]  Preferred lab   Send INR reminders to    Indications  DVT (deep venous thrombosis) [I82.409]        Comments Commenced being [facility] after an acute admission for DVT. 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        ANTICOAG TODAY: Anticoagulation Summary as of 10/07/2014    INR goal 2.0-3.0  Selected INR 2.0 (10/07/2014)  Next INR check 11/04/2014  Target end date Indefinite   Indications  DVT (deep venous thrombosis) [I82.409]      Anticoagulation Episode Summary    [facility]   Preferred lab    Send INR reminders to    Comments Commenced being seen in the [facility] 16-SEP-12 after an acute admission for DVT. 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      PATIENT INSTRUCTIONS: Patient Instructions  Patient instructed to take medications as defined in the Anti-coagulation Track section of this encounter.   Patient instructed to take today's dose.  Patient verbalized understanding of these instructions.       FOLLOW-UP Return in 4 weeks (on 11/04/2014) for Follow up INR at 3:30PM.  Hulen Luster, [name] Pharm.D., CACP

## 2014-10-07 NOTE — Patient Instructions (Signed)
Patient instructed to take medications as defined in the Anti-coagulation Track section of this encounter.  Patient instructed to take today's dose.  Patient verbalized understanding of these instructions.    

## 2014-11-01 ENCOUNTER — Telehealth: Payer: Self-pay | Admitting: Pharmacist

## 2014-11-01 NOTE — Telephone Encounter (Signed)
Call to patient to confirm appointment for 11/04/14 at 3:30 lmtcb

## 2014-11-04 ENCOUNTER — Ambulatory Visit: Payer: Medicaid Other

## 2014-11-11 ENCOUNTER — Ambulatory Visit (INDEPENDENT_AMBULATORY_CARE_PROVIDER_SITE_OTHER): Payer: Medicaid Other | Admitting: Pharmacist

## 2014-11-11 DIAGNOSIS — Z7901 Long term (current) use of anticoagulants: Secondary | ICD-10-CM | POA: Diagnosis not present

## 2014-11-11 DIAGNOSIS — I82409 Acute embolism and thrombosis of unspecified deep veins of unspecified lower extremity: Secondary | ICD-10-CM | POA: Diagnosis present

## 2014-11-11 LAB — POCT INR: INR: 2.7

## 2014-11-11 NOTE — Progress Notes (Signed)
Anti-Coagulation Progress Note  Marie Willis is a 59 y.o. female who is currently on an anti-coagulation regimen.    RECENT RESULTS: Recent results are below, the most recent result is correlated with a dose of 37.5 mg. per week: Lab Results  Component Value Date   INR 2.70 11/11/2014   INR 2.0 10/07/2014   INR 1.9 09/16/2014    ANTI-COAG DOSE: Anticoagulation Dose Instructions as of 11/11/2014      Marie SmilesSun Mon Tue Wed Thu Fri Sat   New Dose 5 mg 7.5 mg 5 mg 5 mg 5 mg 5 mg 5 mg    Description        Take 1 tablet every day except on Wednesdays, take 1/2 tablet.       ANTICOAG SUMMARY: Anticoagulation Episode Summary    Current INR goal 2.0-3.0  Next INR check 12/09/2014  INR from last check 2.70 (11/11/2014)  Weekly max dose   Target end date Indefinite  [facility]  Preferred lab   Send INR reminders to    Indications  DVT (deep venous thrombosis) [I82.409]        [facility]. 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        ANTICOAG TODAY: Anticoagulation Summary as of 11/11/2014    INR goal 2.0-3.0  Selected INR 2.70 (11/11/2014)  Next INR check 12/09/2014  Target end date Indefinite   Indications  DVT (deep venous thrombosis) [I82.409]      Anticoagulation Episode Summary    [facility]   Preferred lab    Send INR reminders to    Comments [facility] for [facility]. 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      PATIENT INSTRUCTIONS: Patient Instructions  Patient instructed to take medications as defined in the Anti-coagulation Track section of this  encounter.  Patient instructed to take today's dose.  Patient verbalized understanding of these instructions.       FOLLOW-UP Return in 4 weeks (on 12/09/2014) for Follow up INR at 3PM.  Hulen LusterJames Domenik Trice, [name] Pharm.D., CACP

## 2014-11-11 NOTE — Patient Instructions (Signed)
Patient instructed to take medications as defined in the Anti-coagulation Track section of this encounter.  Patient instructed to take today's dose.  Patient verbalized understanding of these instructions.    

## 2014-12-09 ENCOUNTER — Ambulatory Visit (INDEPENDENT_AMBULATORY_CARE_PROVIDER_SITE_OTHER): Payer: Medicaid Other | Admitting: Pharmacist

## 2014-12-09 DIAGNOSIS — I82409 Acute embolism and thrombosis of unspecified deep veins of unspecified lower extremity: Secondary | ICD-10-CM | POA: Diagnosis not present

## 2014-12-09 LAB — POCT INR: INR: 2.3

## 2014-12-09 NOTE — Progress Notes (Signed)
Anti-Coagulation Progress Note  Marie Willis is a 59 y.o. female who is currently on an anti-coagulation regimen.    RECENT RESULTS: Recent results are below, the most recent result is correlated with a dose of 37.5 mg. per week: Lab Results  Component Value Date   INR 2.30 12/09/2014   INR 2.70 11/11/2014   INR 2.0 10/07/2014    ANTI-COAG DOSE: Anticoagulation Dose Instructions as of 12/09/2014      [name] Tue Wed Thu Fri Sat   New Dose 5 mg 7.5 mg 5 mg 5 mg 5 mg 5 mg 5 mg       ANTICOAG SUMMARY: Anticoagulation Episode Summary    Current INR goal 2.0-3.0  Next INR check 01/06/2015  INR from last check 2.30 (12/09/2014)  Weekly max dose   Target end date Indefinite  [facility]  Preferred lab   Send INR reminders to    Indications  DVT (deep venous thrombosis) [I82.409]        Comments Commenced being seen in the [facility] 16-SEP-12 after an acute admission for DVT. 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        ANTICOAG TODAY: Anticoagulation Summary as of 12/09/2014    INR goal 2.0-3.0  Selected INR 2.30 (12/09/2014)  Next INR check 01/06/2015  Target end date Indefinite   Indications  DVT (deep venous thrombosis) [I82.409]      Anticoagulation Episode Summary    [facility]   Preferred lab    Send INR reminders to    Comments Commenced being seen in the [facility] 16-SEP-12 after an acute admission for DVT. 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      PATIENT INSTRUCTIONS: Patient Instructions  Patient instructed to take medications as defined in the Anti-coagulation Track section of this encounter.  Patient instructed to take today's dose.  Patient verbalized understanding of these  instructions.       FOLLOW-UP Return in 4 weeks (on 01/06/2015) for Follow up INR at 2:45PM.  Hulen Luster, [name] Pharm.D., CACP

## 2014-12-09 NOTE — Patient Instructions (Signed)
Patient instructed to take medications as defined in the Anti-coagulation Track section of this encounter.  Patient instructed to take today's dose.  Patient verbalized understanding of these instructions.    

## 2015-01-03 ENCOUNTER — Telehealth: Payer: Self-pay | Admitting: Pharmacist

## 2015-01-03 NOTE — Telephone Encounter (Signed)
Call to patient to confirm appointment for 01/06/15 at 3:00 someone answers and hangs up

## 2015-01-06 ENCOUNTER — Ambulatory Visit (INDEPENDENT_AMBULATORY_CARE_PROVIDER_SITE_OTHER): Payer: Medicaid Other | Admitting: Pharmacist

## 2015-01-06 DIAGNOSIS — Z7901 Long term (current) use of anticoagulants: Secondary | ICD-10-CM | POA: Diagnosis not present

## 2015-01-06 DIAGNOSIS — I82409 Acute embolism and thrombosis of unspecified deep veins of unspecified lower extremity: Secondary | ICD-10-CM | POA: Diagnosis not present

## 2015-01-06 LAB — POCT INR: INR: 2.6

## 2015-01-06 NOTE — Patient Instructions (Signed)
Patient instructed to take medications as defined in the Anti-coagulation Track section of this encounter.  Patient instructed to 37.5 today's dose.  Patient verbalized understanding of these instructions.

## 2015-01-06 NOTE — Progress Notes (Signed)
Anti-Coagulation Progress Note  Marie Willis is a 59 y.o. female who is currently on an anti-coagulation regimen.    RECENT RESULTS: Recent results are below, the most recent result is correlated with a dose of 37.5 mg. per week: Lab Results  Component Value Date   INR 2.60 01/06/2015   INR 2.30 12/09/2014   INR 2.70 11/11/2014    ANTI-COAG DOSE: Anticoagulation Dose Instructions as of 01/06/2015      [name] Tue Wed Thu Fri Sat   New Dose 5 mg 7.5 mg 5 mg 5 mg 5 mg 5 mg 5 mg       ANTICOAG SUMMARY: Anticoagulation Episode Summary    Current INR goal 2.0-3.0  Next INR check 01/27/2015  INR from last check 2.60 (01/06/2015)  Weekly max dose   Target end date Indefinite  [facility]  Preferred lab   Send INR reminders to    Indications  DVT (deep venous thrombosis) [I82.409]        Comments Commenced being seen in the [facility] 16-SEP-12 after an acute admission for DVT. 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        ANTICOAG TODAY: Anticoagulation Summary as of 01/06/2015    INR goal 2.0-3.0  Selected INR 2.60 (01/06/2015)  Next INR check 01/27/2015  Target end date Indefinite   Indications  DVT (deep venous thrombosis) [I82.409]      Anticoagulation Episode Summary    [facility]   Preferred lab    Send INR reminders to    Comments Commenced being seen in the [facility] 16-SEP-12 after an acute admission for DVT. 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      PATIENT INSTRUCTIONS: Patient Instructions  Patient instructed to take medications as defined in the Anti-coagulation Track section of this encounter.  Patient instructed to 37.5 today's dose.  Patient verbalized understanding of  these instructions.       FOLLOW-UP Return in 3 weeks (on 01/27/2015) for Follow up INR at 3PM.  Hulen Luster, [name] Pharm.D., CACP

## 2015-01-09 NOTE — Progress Notes (Signed)
INTERNAL MEDICINE TEACHING ATTENDING ADDENDUM - Erlinda Hong M.D  Duration- life long, Indication- unprovoked DVT, INR- at goal. Agree with pharmacy recommendations as outlined in their note.

## 2015-01-22 ENCOUNTER — Other Ambulatory Visit: Payer: Self-pay | Admitting: *Deleted

## 2015-01-23 MED ORDER — LISINOPRIL 20 MG PO TABS: 20.0000 mg | ORAL_TABLET | Freq: Every day | ORAL | Status: DC

## 2015-01-27 ENCOUNTER — Ambulatory Visit: Payer: Medicaid Other

## 2015-02-03 ENCOUNTER — Ambulatory Visit (INDEPENDENT_AMBULATORY_CARE_PROVIDER_SITE_OTHER): Payer: Medicaid Other | Admitting: Pharmacist

## 2015-02-03 DIAGNOSIS — Z86718 Personal history of other venous thrombosis and embolism: Secondary | ICD-10-CM

## 2015-02-03 DIAGNOSIS — Z7901 Long term (current) use of anticoagulants: Secondary | ICD-10-CM | POA: Diagnosis present

## 2015-02-03 LAB — POCT INR: INR: 3.3

## 2015-02-03 NOTE — Progress Notes (Signed)
INTERNAL MEDICINE TEACHING ATTENDING ADDENDUM - Erlinda Hong M.D  Duration- indefinite, Indication- recurrent DVT, INR- 3.3. Agree with pharmacy recommendations as outlined in their note.

## 2015-02-03 NOTE — Progress Notes (Signed)
Anti-Coagulation Progress Note  Marie Willis is a 59 y.o. female who is currently on an anti-coagulation regimen.    RECENT RESULTS: Recent results are below, the most recent result is correlated with a dose of 37.5 mg. per week: Lab Results  Component Value Date   INR 3.3 02/03/2015   INR 2.60 01/06/2015   INR 2.30 12/09/2014    ANTI-COAG DOSE: Anticoagulation Dose Instructions as of 02/03/2015      [name] Tue Wed Thu Fri Sat   New Dose 5 mg 7.5 mg 5 mg 5 mg 5 mg 5 mg 5 mg       ANTICOAG SUMMARY: Anticoagulation Episode Summary    Current INR goal 2.0-3.0  Next INR check 02/24/2015  INR from last check 3.3! (02/03/2015)  Weekly max dose   Target end date Indefinite  [facility]  Preferred lab   Send INR reminders to    Indications  DVT (deep venous thrombosis) (HCC) [I82.409]        Comments Commenced being seen in the [facility] 16-SEP-12 after an acute admission for DVT. 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        ANTICOAG TODAY: Anticoagulation Summary as of 02/03/2015    INR goal 2.0-3.0  Selected INR 3.3! (02/03/2015)  Next INR check 02/24/2015  Target end date Indefinite   Indications  DVT (deep venous thrombosis) (HCC) [I82.409]      Anticoagulation Episode Summary    [facility]   Preferred lab    Send INR reminders to    Comments Commenced being seen in the [facility] 16-SEP-12 after an acute admission for DVT. 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      PATIENT INSTRUCTIONS: Patient Instructions  Patient instructed to take medications as defined in the Anti-coagulation Track section of this encounter.  Patient instructed to hold today's dose.  Patient verbalized  understanding of these instructions.  Will restart usual schedule after holding dose on 02/03/15.    FOLLOW-UP Return in 3 weeks (on 02/24/2015) for INR f/u check at 3:00 pm.  Arcola Jansky, PharmD Clinical Pharmacy Resident Pager: [phone]

## 2015-02-03 NOTE — Patient Instructions (Signed)
Patient instructed to take medications as defined in the Anti-coagulation Track section of this encounter.  Patient instructed to hold today's dose.  Patient verbalized understanding of these instructions.  Will restart usual schedule after holding dose on 02/03/15.

## 2015-02-11 ENCOUNTER — Encounter: Payer: Medicaid Other | Admitting: Internal Medicine

## 2015-02-21 ENCOUNTER — Ambulatory Visit: Payer: Medicaid Other

## 2015-03-03 ENCOUNTER — Ambulatory Visit (INDEPENDENT_AMBULATORY_CARE_PROVIDER_SITE_OTHER): Payer: Medicaid Other | Admitting: Pharmacist

## 2015-03-03 ENCOUNTER — Ambulatory Visit: Payer: Medicaid Other

## 2015-03-03 ENCOUNTER — Ambulatory Visit: Payer: Medicaid Other | Admitting: *Deleted

## 2015-03-03 DIAGNOSIS — Z7901 Long term (current) use of anticoagulants: Secondary | ICD-10-CM

## 2015-03-03 DIAGNOSIS — Z23 Encounter for immunization: Secondary | ICD-10-CM

## 2015-03-03 DIAGNOSIS — I82401 Acute embolism and thrombosis of unspecified deep veins of right lower extremity: Secondary | ICD-10-CM | POA: Diagnosis present

## 2015-03-03 LAB — POCT INR: INR: 2.8

## 2015-03-03 NOTE — Patient Instructions (Signed)
Patient instructed to take medications as defined in the Anti-coagulation Track section of this encounter.  Patient instructed to take today's dose.  Patient verbalized understanding of these instructions.    

## 2015-03-03 NOTE — Progress Notes (Signed)
Anti-Coagulation Progress Note  Marie Willis is a 59 y.o. female who is currently on an anti-coagulation regimen.    RECENT RESULTS: Recent results are below, the most recent result is correlated with a dose of 37.5 mg. per week: Lab Results  Component Value Date   INR 2.80 03/03/2015   INR 3.3 02/03/2015   INR 2.60 01/06/2015    ANTI-COAG DOSE: Anticoagulation Dose Instructions as of 03/03/2015      Glynis SmilesSun Mon Tue Wed Thu Fri Sat   New Dose 5 mg 7.5 mg 5 mg 5 mg 5 mg 5 mg 5 mg       ANTICOAG SUMMARY: Anticoagulation Episode Summary    Current INR goal 2.0-3.0  Next INR check 04/07/2015  INR from last check 2.80 (03/03/2015)  Weekly max dose   Target end date Indefinite  [facility]  Preferred lab   Send INR reminders to    Indications  DVT (deep venous thrombosis) (HCC) [I82.409]        Comments [facility] acute [facility] for DVT. 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        ANTICOAG TODAY: Anticoagulation Summary as of 03/03/2015    INR goal 2.0-3.0  Selected INR 2.80 (03/03/2015)  Next INR check 04/07/2015  Target end date Indefinite   Indications  DVT (deep venous thrombosis) (HCC) [I82.409]      Anticoagulation Episode Summary    [facility]   Preferred lab    Send INR reminders to    Comments Commen[facility] after an acute admission for DVT. 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      PATIENT INSTRUCTIONS: Patient Instructions  Patient instructed to take medications as defined in the Anti-coagulation Track section of this encounter.  Patient instructed to take today's dose.  Patient verbalized  understanding of these instructions.       FOLLOW-UP Return in 5 weeks (on 04/07/2015) for Follow up INR at 2:30PM.  Hulen LusterJames Jermell Holeman, [name] Pharm.D., CACP

## 2015-03-04 ENCOUNTER — Encounter: Payer: Medicaid Other | Admitting: Internal Medicine

## 2015-03-11 ENCOUNTER — Encounter: Payer: Medicaid Other | Admitting: Internal Medicine

## 2015-03-18 ENCOUNTER — Encounter: Payer: Self-pay | Admitting: Student

## 2015-04-07 ENCOUNTER — Ambulatory Visit: Payer: Medicaid Other

## 2015-04-14 ENCOUNTER — Ambulatory Visit (INDEPENDENT_AMBULATORY_CARE_PROVIDER_SITE_OTHER): Payer: Medicaid Other | Admitting: Pharmacist

## 2015-04-14 DIAGNOSIS — I82409 Acute embolism and thrombosis of unspecified deep veins of unspecified lower extremity: Secondary | ICD-10-CM | POA: Diagnosis present

## 2015-04-14 DIAGNOSIS — Z7901 Long term (current) use of anticoagulants: Secondary | ICD-10-CM

## 2015-04-14 LAB — POCT INR: INR: 2.6

## 2015-04-14 NOTE — Patient Instructions (Signed)
Patient instructed to take medications as defined in the Anti-coagulation Track section of this encounter.  Patient instructed to take today's dose.  Patient verbalized understanding of these instructions.    

## 2015-04-14 NOTE — Progress Notes (Signed)
Anti-Coagulation Progress Note  Marie Willis is a 59 y.o. female who is currently on an anti-coagulation regimen.    RECENT RESULTS: Recent results are below, the most recent result is correlated with a dose of 37.5 mg. per week: Lab Results  Component Value Date   INR 2.60 04/14/2015   INR 2.80 03/03/2015   INR 3.3 02/03/2015    ANTI-COAG DOSE: Anticoagulation Dose Instructions as of 04/14/2015      Glynis SmilesSun Mon Tue Wed Thu Fri Sat   New Dose 5 mg 7.5 mg 5 mg 5 mg 5 mg 5 mg 5 mg       ANTICOAG SUMMARY: Anticoagulation Episode Summary    Current INR goal 2.0-3.0  Next INR check 05/19/2015  INR from last check 2.60 (04/14/2015)  Weekly max dose   Target end date Indefinite  [facility]  Preferred lab   Send INR reminders to    Indications  DVT (deep venous thrombosis) (HCC) [I82.409]        Comments [facility] acute admission for DVT. 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        ANTICOAG TODAY: Anticoagulation Summary as of 04/14/2015    INR goal 2.0-3.0  Selected INR 2.60 (04/14/2015)  Next INR check 05/19/2015  Target end date Indefinite   Indications  DVT (deep venous thrombosis) (HCC) [I82.409]      Anticoagulation Episode Summary    [facility]   Preferred lab    Send INR reminders to    Comments [facility] for DVT. 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      PATIENT INSTRUCTIONS: Patient Instructions  Patient instructed to take medications as defined in the Anti-coagulation Track section of this encounter.  Patient instructed to take today's dose.  Patient verbalized  understanding of these instructions.       FOLLOW-UP Return in 5 weeks (on 05/19/2015) for Follow up INR at 2PM.  Hulen LusterJames Shea Swalley, [name] Pharm.D., CACP

## 2015-04-17 NOTE — Progress Notes (Signed)
[facility]  INR at goal.

## 2015-04-21 ENCOUNTER — Other Ambulatory Visit: Payer: Self-pay | Admitting: Internal Medicine

## 2015-04-22 ENCOUNTER — Ambulatory Visit (INDEPENDENT_AMBULATORY_CARE_PROVIDER_SITE_OTHER): Payer: Medicaid Other | Admitting: Internal Medicine

## 2015-04-22 ENCOUNTER — Encounter: Payer: Self-pay | Admitting: Internal Medicine

## 2015-04-22 VITALS — BP 106/80 | HR 79 | Temp 98.2°F | Wt 206.4 lb

## 2015-04-22 DIAGNOSIS — I129 Hypertensive chronic kidney disease with stage 1 through stage 4 chronic kidney disease, or unspecified chronic kidney disease: Secondary | ICD-10-CM | POA: Diagnosis not present

## 2015-04-22 DIAGNOSIS — N183 Chronic kidney disease, stage 3 unspecified: Secondary | ICD-10-CM

## 2015-04-22 DIAGNOSIS — G894 Chronic pain syndrome: Secondary | ICD-10-CM

## 2015-04-22 DIAGNOSIS — F17200 Nicotine dependence, unspecified, uncomplicated: Secondary | ICD-10-CM

## 2015-04-22 DIAGNOSIS — F17210 Nicotine dependence, cigarettes, uncomplicated: Secondary | ICD-10-CM | POA: Diagnosis not present

## 2015-04-22 DIAGNOSIS — Z7901 Long term (current) use of anticoagulants: Secondary | ICD-10-CM | POA: Diagnosis not present

## 2015-04-22 DIAGNOSIS — I82409 Acute embolism and thrombosis of unspecified deep veins of unspecified lower extremity: Secondary | ICD-10-CM | POA: Diagnosis not present

## 2015-04-22 DIAGNOSIS — Z0000 Encounter for general adult medical examination without abnormal findings: Secondary | ICD-10-CM

## 2015-04-22 DIAGNOSIS — I10 Essential (primary) hypertension: Secondary | ICD-10-CM

## 2015-04-22 DIAGNOSIS — I82401 Acute embolism and thrombosis of unspecified deep veins of right lower extremity: Secondary | ICD-10-CM

## 2015-04-22 NOTE — Patient Instructions (Signed)
1. Please make a follow up for 6 months.   2. Please take all medications as previously prescribed.  Please stop smoking.   We will set up a time for your mammogram.   3. If you have worsening of your symptoms or new symptoms arise, please call the clinic [phone]), or go to the ER immediately if symptoms are severe.  You have done a great job in taking all your medications. Please continue to do this.   Smoking Cessation, Tips for Success If you are ready to quit smoking, congratulations! You have chosen to help yourself be healthier. Cigarettes bring nicotine, tar, carbon monoxide, and other irritants into your body. Your lungs, heart, and blood vessels will be able to work better without these poisons. There are many different ways to quit smoking. Nicotine gum, nicotine patches, a nicotine inhaler, or nicotine nasal spray can help with physical craving. Hypnosis, support groups, and medicines help break the habit of smoking. WHAT THINGS CAN I DO TO MAKE QUITTING EASIER?  Here are some tips to help you quit for good:  Pick a date when you will quit smoking completely. Tell all of your friends and family about your plan to quit on that date.  Do not try to slowly cut down on the number of cigarettes you are smoking. Pick a quit date and quit smoking completely starting on that day.  Throw away all cigarettes.   Clean and remove all ashtrays from your home, work, and car.  On a card, write down your reasons for quitting. Carry the card with you and read it when you get the urge to smoke.  Cleanse your body of nicotine. Drink enough water and fluids to keep your urine clear or pale yellow. Do this after quitting to flush the nicotine from your body.  Learn to predict your moods. Do not let a bad situation be your excuse to have a cigarette. Some situations in your life might tempt you into wanting a cigarette.  Never have "just one" cigarette. It leads to wanting another and  another. Remind yourself of your decision to quit.  Change habits associated with smoking. If you smoked while driving or when feeling stressed, try other activities to replace smoking. Stand up when drinking your coffee. Brush your teeth after eating. Sit in a different chair when you read the paper. Avoid alcohol while trying to quit, and try to drink fewer caffeinated beverages. Alcohol and caffeine may urge you to smoke.  Avoid foods and drinks that can trigger a desire to smoke, such as sugary or spicy foods and alcohol.  Ask people who smoke not to smoke around you.  Have something planned to do right after eating or having a cup of coffee. For example, plan to take a walk or exercise.  Try a relaxation exercise to calm you down and decrease your stress. Remember, you may be tense and nervous for the first 2 weeks after you quit, but this will pass.  Find new activities to keep your hands busy. Play with a pen, coin, or rubber band. Doodle or draw things on paper.  Brush your teeth right after eating. This will help cut down on the craving for the taste of tobacco after meals. You can also try mouthwash.   Use oral substitutes in place of cigarettes. Try using lemon drops, carrots, cinnamon sticks, or chewing gum. Keep them handy so they are available when you have the urge to smoke.  When you have the  urge to smoke, try deep breathing.  Designate your home as a nonsmoking area.  If you are a heavy smoker, ask your health care provider about a prescription for nicotine chewing gum. It can ease your withdrawal from nicotine.  Reward yourself. Set aside the cigarette money you save and buy yourself something nice.  Look for support from others. Join a support group or smoking cessation program. Ask someone at home or at work to help you with your plan to quit smoking.  Always ask yourself, "Do I need this cigarette or is this just a reflex?" Tell yourself, "Today, I choose not to  smoke," or "I do not want to smoke." You are reminding yourself of your decision to quit.  Do not replace cigarette smoking with electronic cigarettes (commonly called e-cigarettes). The safety of e-cigarettes is unknown, and some may contain harmful chemicals.  If you relapse, do not give up! Plan ahead and think about what you will do the next time you get the urge to smoke. HOW WILL I FEEL WHEN I QUIT SMOKING? You may have symptoms of withdrawal because your body is used to nicotine (the addictive substance in cigarettes). You may crave cigarettes, be irritable, feel very hungry, cough often, get headaches, or have difficulty concentrating. The withdrawal symptoms are only temporary. They are strongest when you first quit but will go away within 10-14 days. When withdrawal symptoms occur, stay in control. Think about your reasons for quitting. Remind yourself that these are signs that your body is healing and getting used to being without cigarettes. Remember that withdrawal symptoms are easier to treat than the major diseases that smoking can cause.  Even after the withdrawal is over, expect periodic urges to smoke. However, these cravings are generally short lived and will go away whether you smoke or not. Do not smoke! WHAT RESOURCES ARE AVAILABLE TO HELP ME QUIT SMOKING? Your health care provider can direct you to community resources or hospitals for support, which may include:  Group support.  Education.  Hypnosis.  Therapy.   This information is not intended to replace advice given to you by your health care provider. Make sure you discuss any questions you have with your health care provider.   Document Released: 01/09/2004 Document Revised: 05/03/2014 Document Reviewed: 09/28/2012 Elsevier Interactive Patient Education [employer]

## 2015-04-22 NOTE — Assessment & Plan Note (Signed)
Most recent Cr 1.35 as of 06/2014.  -Recheck BMP.

## 2015-04-22 NOTE — Assessment & Plan Note (Signed)
Chronic pain seems to be stable at this time, says she has pain in herr lower back, neck, and ankles. Only taking tylenol currently as [facility]. Not able to take NSAIDS d/t Coumadin and CKD.  -Referral to pain clinic

## 2015-04-22 NOTE — Progress Notes (Signed)
   Subjective:   Patient ID: Marie Willis female   DOB: 01/15/1956 59 y.o.   MRN: [id number]  HPI: Marie Willis is a 59 y.o. female w/ PMHx of chronic pain, anxiety, depression, tobacco abuse, CKD stage 3, and HTN, presents to the clinic today for a follow-up visit regarding her chronic pain. Patient says she is feeling great recently, aside from some mild holiday stresses. Says her pain has been causing her a lot of trouble, is only taking Tylenol at this point. Patient previously violated her pain contract in the past, [facility] Is interested in obtaining assistance from pain clinic. Currently has lower back pain, neck pain, cervical radiculopathy, and bilateral ankle pain, all of which are chronic in nature and are stable. No recent changes. Patient says she feels like her vision is worsening somewhat over the past year, is also interested in ophthalmology referral, [facility]   Patient is still smoking 1 pack per day, does state she is interested in quitting.  Past Medical History  Diagnosis Date  . Anxiety   . Depression   . Hypertension   . Low back pain    Current Outpatient Prescriptions  Medication Sig Dispense Refill  . cetirizine (ZYRTEC) 10 MG tablet Take 10 mg by mouth daily.    Marland Kitchen. lisinopril (PRINIVIL,ZESTRIL) 20 MG tablet Take 1 tablet (20 mg total) by mouth daily. 90 tablet 1  . warfarin (COUMADIN) 5 MG tablet TAKE 1 TABLET BY MOUTH ONCE DAILY EXCEPT 1/2 TABLET ON WEDNESDAY AS DIRECTED 30 tablet 11   No current facility-administered medications for this visit.    Review of Systems: General: Denies fever, chills, diaphoresis, appetite change and fatigue.  Respiratory: Denies SOB, DOE, cough, and wheezing.   Cardiovascular: Denies chest pain and palpitations.  Gastrointestinal: Denies nausea, vomiting, abdominal pain, and diarrhea.  Genitourinary: Denies dysuria, increased frequency, and  flank pain. Endocrine: Denies hot or cold intolerance, polyuria, and polydipsia. Musculoskeletal: Positive for chronic pain. Denies joint swelling and gait problem.  Skin: Denies pallor, rash and wounds.  Neurological: Denies dizziness, seizures, syncope, weakness, lightheadedness, numbness and headaches.  Psychiatric/Behavioral: Denies mood changes, and sleep disturbances.  Objective:   Physical Exam: Filed Vitals:   04/22/15 1350  BP: 106/80  Pulse: 79  Temp: 98.2 F (36.8 C)  TempSrc: Oral  Weight: 206 lb 6.4 oz (93.622 kg)  SpO2: 100%    General: Overweight white female, alert, cooperative, NAD. HEENT: PERRL, EOMI. Moist mucus membranes. Poor dentition.  Neck: Full range of motion without pain, supple, no lymphadenopathy or carotid bruits Lungs: Clear to ascultation bilaterally, normal work of respiration, no wheezes, rales, rhonchi Heart: RRR, no murmurs, gallops, or rubs Abdomen: Soft, non-tender, non-distended, BS + Extremities: No cyanosis, clubbing, or edema Neurologic: Alert & oriented X3, cranial nerves II-XII intact, strength grossly intact, sensation intact to light touch   Assessment & Plan:   Please see problem based assessment and plan.

## 2015-04-22 NOTE — Assessment & Plan Note (Signed)
Interested in quitting, but not interested in gum, patch, etc. Gave patient info about quitting, encouraged quitting ASAP and encouraged husband to quit as well.

## 2015-04-22 NOTE — Assessment & Plan Note (Signed)
Currently compliant with her Coumadin, taking 5 mg daily, except for 7.5 mg daily on Mondays. INR therapeutic.  -Continue Coumadin as above.

## 2015-04-22 NOTE — Assessment & Plan Note (Signed)
Patient agreeable to mammogram to be scheduled today.  -Deferred pap and colonoscopy, will address at next clinic visit.

## 2015-04-22 NOTE — Assessment & Plan Note (Signed)
BP Readings from Last 3 Encounters:  04/22/15 106/80  06/27/14 123/82  06/03/14 137/83    Lab Results  Component Value Date   NA 136 06/27/2014   K 4.5 06/27/2014   CREATININE 1.35* 06/27/2014    Assessment: Blood pressure control:  Controlled.  Comments: Taking Lisinopril 20 mg daily.   Plan: Medications:  continue current medications Other plans: Recheck BMP today. RTC in 6 months.

## 2015-04-23 LAB — BMP8+ANION GAP
Anion Gap: 20 mmol/L — ABNORMAL HIGH (ref 10.0–18.0)
BUN/Creatinine Ratio: 13 (ref 9–23)
BUN: 17 mg/dL (ref 6–24)
CO2: 23 mmol/L (ref 18–29)
Calcium: 9.3 mg/dL (ref 8.7–10.2)
Chloride: 99 mmol/L (ref 96–106)
Creatinine, Ser: 1.26 mg/dL — ABNORMAL HIGH (ref 0.57–1.00)
GFR calc Af Amer: 55 mL/min/{1.73_m2} — ABNORMAL LOW (ref >59)
GFR calc non Af Amer: 47 mL/min/{1.73_m2} — ABNORMAL LOW (ref >59)
Glucose: 123 mg/dL — ABNORMAL HIGH (ref 65–99)
Potassium: 4.1 mmol/L (ref 3.5–5.2)
Sodium: 142 mmol/L (ref 134–144)

## 2015-04-23 NOTE — Progress Notes (Signed)
Internal Medicine Clinic Attending  Case discussed with Dr. Jones at the time of the visit.  We reviewed the resident's history and exam and pertinent patient test results.  I agree with the assessment, diagnosis, and plan of care documented in the resident's note.  

## 2015-05-19 ENCOUNTER — Ambulatory Visit: Payer: Medicaid Other

## 2015-05-21 ENCOUNTER — Telehealth: Payer: Self-pay | Admitting: *Deleted

## 2015-05-21 NOTE — Telephone Encounter (Signed)
SPOKE WITH BENNY ,  HER HUSBAND, LEFT MESSAGE WITH HIM TO TELL ABOUT HER APPOINTMENT WITH [facility] 1-10-017. WAS UNABLE TO CONTACT [place] BY PHONE.

## 2015-05-23 ENCOUNTER — Telehealth: Payer: Self-pay | Admitting: Internal Medicine

## 2015-05-23 NOTE — Telephone Encounter (Signed)
LMTCB IF SHE NEEDS TO CANCEL °

## 2015-05-26 ENCOUNTER — Ambulatory Visit (INDEPENDENT_AMBULATORY_CARE_PROVIDER_SITE_OTHER): Payer: Medicaid Other | Admitting: Pharmacist

## 2015-05-26 DIAGNOSIS — Z7901 Long term (current) use of anticoagulants: Secondary | ICD-10-CM

## 2015-05-26 DIAGNOSIS — I82441 Acute embolism and thrombosis of right tibial vein: Secondary | ICD-10-CM

## 2015-05-26 LAB — POCT INR: INR: 2.3

## 2015-05-26 NOTE — Progress Notes (Signed)
Anti-Coagulation Progress Note  Marie Willis is a 60 y.o. female who is currently on an anti-coagulation regimen.    RECENT RESULTS: Recent results are below, the most recent result is correlated with a dose of 37.5 mg. per week: Lab Results  Component Value Date   INR 2.30 05/26/2015   INR 2.60 04/14/2015   INR 2.80 03/03/2015    ANTI-COAG DOSE: Anticoagulation Dose Instructions as of 05/26/2015      [name] Tue Wed Thu Fri Sat   New Dose 5 mg 7.5 mg 5 mg 5 mg 5 mg 5 mg 5 mg       ANTICOAG SUMMARY: Anticoagulation Episode Summary    Current INR goal 2.0-3.0  Next INR check 06/23/2015  INR from last check 2.30 (05/26/2015)  Weekly max dose   Target end date Indefinite  [facility]  Preferred lab   Send INR reminders to    Indications  DVT (deep venous thrombosis) (HCC) [I82.409]        Comments Commenced being seen in the [facility] 16-SEP-12 after an acute admission for DVT. 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        ANTICOAG TODAY: Anticoagulation Summary as of 05/26/2015    INR goal 2.0-3.0  Selected INR 2.30 (05/26/2015)  Next INR check 06/23/2015  Target end date Indefinite   Indications  DVT (deep venous thrombosis) (HCC) [I82.409]      Anticoagulation Episode Summary    [facility]   Preferred lab    Send INR reminders to    Comments Commenced being seen in the [facility] 16-SEP-12 after an acute admission for DVT. 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      PATIENT INSTRUCTIONS: Patient Instructions  Patient instructed to take medications as defined in the Anti-coagulation Track section of this encounter.  Patient instructed to take today's dose.  Patient verbalized  understanding of these instructions.       FOLLOW-UP Return in 4 weeks (on 06/23/2015) for Follow up INR at 2:15PM.  Marie Willis, [name] Pharm.D., CACP

## 2015-05-26 NOTE — Patient Instructions (Signed)
Patient instructed to take medications as defined in the Anti-coagulation Track section of this encounter.  Patient instructed to take today's dose.  Patient verbalized understanding of these instructions.    

## 2015-05-28 NOTE — Progress Notes (Signed)
INTERNAL MEDICINE TEACHING ATTENDING ADDENDUM - Antonie Borjon M.D  Duration- indefinite, Indication- recurrent DVT, INR- therapeutic. Agree with pharmacy recommendations as outlined in their note.    

## 2015-05-29 ENCOUNTER — Other Ambulatory Visit: Payer: Self-pay

## 2015-05-29 DIAGNOSIS — Z1231 Encounter for screening mammogram for malignant neoplasm of breast: Secondary | ICD-10-CM

## 2015-06-23 ENCOUNTER — Ambulatory Visit: Payer: Medicaid Other

## 2015-06-30 ENCOUNTER — Ambulatory Visit (INDEPENDENT_AMBULATORY_CARE_PROVIDER_SITE_OTHER): Payer: Medicaid Other | Admitting: Pharmacist

## 2015-06-30 DIAGNOSIS — I82401 Acute embolism and thrombosis of unspecified deep veins of right lower extremity: Secondary | ICD-10-CM | POA: Diagnosis present

## 2015-06-30 DIAGNOSIS — Z7901 Long term (current) use of anticoagulants: Secondary | ICD-10-CM

## 2015-06-30 LAB — POCT INR: INR: 1.5

## 2015-06-30 NOTE — Progress Notes (Signed)
Anti-Coagulation Progress Note  Marie Willis is a 60 y.o. female who is currently on an anti-coagulation regimen.    RECENT RESULTS: Recent results are below, the most recent result is correlated with a dose of 37.5 mg. per week: Lab Results  Component Value Date   INR 1.50 06/30/2015   INR 2.30 05/26/2015   INR 2.60 04/14/2015    ANTI-COAG DOSE: Anticoagulation Dose Instructions as of 06/30/2015      Glynis SmilesSun Mon Tue Wed Thu Fri Sat   New Dose 5 mg 7.5 mg 5 mg 7.5 mg 5 mg 7.5 mg 5 mg       ANTICOAG SUMMARY: Anticoagulation Episode Summary    Current INR goal 2.0-3.0  Next INR check 07/14/2015  INR from last check 1.50! (06/30/2015)  Weekly max dose   Target end date Indefinite  [facility]  Preferred lab   Send INR reminders to    Indications  DVT (deep venous thrombosis) (HCC) [I82.409]        Comments [facility] for DVT. 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        ANTICOAG TODAY: Anticoagulation Summary as of 06/30/2015    INR goal 2.0-3.0  Selected INR 1.50! (06/30/2015)  Next INR check 07/14/2015  Target end date Indefinite   Indications  DVT (deep venous thrombosis) (HCC) [I82.409]      Anticoagulation Episode Summary    [facility]   Preferred lab    Send INR reminders to    [facility] for DVT. 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      PATIENT INSTRUCTIONS: Patient Instructions  Patient instructed to take medications as defined in the Anti-coagulation Track section of this encounter.  Patient instructed to take today's dose.  Patient verbalized  understanding of these instructions.       FOLLOW-UP Return in 2 weeks (on 07/14/2015) for Follow up INR at 2:30PM.  Hulen LusterJames Groce, [name] Pharm.D., CACP

## 2015-06-30 NOTE — Patient Instructions (Signed)
Patient instructed to take medications as defined in the Anti-coagulation Track section of this encounter.  Patient instructed to take today's dose.  Patient verbalized understanding of these instructions.    

## 2015-07-01 NOTE — Progress Notes (Signed)
INTERNAL MEDICINE TEACHING ATTENDING ADDENDUM - Duncan Vincent M.D  Duration- indefinite, Indication- DVT, INR- subtherapeutic. Agree with pharmacy recommendations as outlined in their note.      

## 2015-07-12 ENCOUNTER — Other Ambulatory Visit: Payer: Self-pay | Admitting: Internal Medicine

## 2015-07-14 ENCOUNTER — Ambulatory Visit (INDEPENDENT_AMBULATORY_CARE_PROVIDER_SITE_OTHER): Payer: Medicaid Other | Admitting: Pharmacist

## 2015-07-14 DIAGNOSIS — I82401 Acute embolism and thrombosis of unspecified deep veins of right lower extremity: Secondary | ICD-10-CM | POA: Diagnosis not present

## 2015-07-14 DIAGNOSIS — Z86718 Personal history of other venous thrombosis and embolism: Secondary | ICD-10-CM | POA: Diagnosis not present

## 2015-07-14 DIAGNOSIS — Z7901 Long term (current) use of anticoagulants: Secondary | ICD-10-CM

## 2015-07-14 LAB — POCT INR: INR: 2.7

## 2015-07-14 MED ORDER — WARFARIN SODIUM 5 MG PO TABS: ORAL_TABLET | ORAL | Status: DC

## 2015-07-14 NOTE — Patient Instructions (Signed)
Patient instructed to take medications as defined in the Anti-coagulation Track section of this encounter.  Patient instructed to take today's dose.  Patient verbalized understanding of these instructions.    

## 2015-07-14 NOTE — Progress Notes (Signed)
INTERNAL MEDICINE TEACHING ATTENDING ADDENDUM - Nyeli Holtmeyer M.D  Duration- indefinite, Indication- recurrent DVT, INR- therapeutic. Agree with pharmacy recommendations as outlined in their note.     

## 2015-07-14 NOTE — Progress Notes (Signed)
Anti-Coagulation Progress Note  Marie Willis is a 60 y.o. female who is currently on an anti-coagulation regimen.    RECENT RESULTS: Recent results are below, the most recent result is correlated with a dose of 42.5 mg. per week: Lab Results  Component Value Date   INR 2.7 07/14/2015   INR 1.50 06/30/2015   INR 2.30 05/26/2015    ANTI-COAG DOSE: Anticoagulation Dose Instructions as of 07/14/2015      Glynis SmilesSun Mon Tue Wed Thu Fri Sat   New Dose 5 mg 7.5 mg 5 mg 7.5 mg 5 mg 7.5 mg 5 mg       ANTICOAG SUMMARY: Anticoagulation Episode Summary    Current INR goal 2.0-3.0  Next INR check 08/04/2015  INR from last check 2.7 (07/14/2015)  Weekly max dose   Target end date Indefinite  [facility]  Preferred lab   Send INR reminders to    Indications  DVT (deep venous thrombosis) (HCC) [I82.409]        [facility]. 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        ANTICOAG TODAY: Anticoagulation Summary as of 07/14/2015    INR goal 2.0-3.0  Selected INR 2.7 (07/14/2015)  Next INR check 08/04/2015  Target end date Indefinite   Indications  DVT (deep venous thrombosis) (HCC) [I82.409]      Anticoagulation Episode Summary    [facility]   Preferred lab    Send INR reminders to    [facility]. 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      PATIENT INSTRUCTIONS: Patient Instructions  Patient instructed to take medications as defined in the Anti-coagulation Track section of this encounter.  Patient instructed to take today's dose.  Patient verbalized  understanding of these instructions.        FOLLOW-UP Return in about 3 weeks (around 08/04/2015) for f/u INR @ 2:15pm.  Hulen LusterJames Laynee Lockamy, [name] Pharm.D., CACP  Casilda Carlsaylor Stone, PharmD.

## 2015-07-18 ENCOUNTER — Other Ambulatory Visit: Payer: Self-pay | Admitting: Internal Medicine

## 2015-08-04 ENCOUNTER — Ambulatory Visit: Payer: Medicaid Other

## 2015-08-11 ENCOUNTER — Ambulatory Visit (INDEPENDENT_AMBULATORY_CARE_PROVIDER_SITE_OTHER): Payer: Medicaid Other | Admitting: Pharmacist

## 2015-08-11 DIAGNOSIS — Z7901 Long term (current) use of anticoagulants: Secondary | ICD-10-CM

## 2015-08-11 DIAGNOSIS — I82401 Acute embolism and thrombosis of unspecified deep veins of right lower extremity: Secondary | ICD-10-CM

## 2015-08-11 LAB — POCT INR: INR: 2.8

## 2015-08-11 NOTE — Progress Notes (Signed)
Anti-Coagulation Progress Note  Marie Willis is a 60 y.o. female who is currently on an anti-coagulation regimen.    RECENT RESULTS: Recent results are below, the most recent result is correlated with a dose of 42.5 mg. per week: Lab Results  Component Value Date   INR 2.80 08/11/2015   INR 2.7 07/14/2015   INR 1.50 06/30/2015    ANTI-COAG DOSE: Anticoagulation Dose Instructions as of 08/11/2015      Marie SmilesSun Mon Tue Wed Thu Fri Sat   New Dose 5 mg 7.5 mg 5 mg 7.5 mg 5 mg 7.5 mg 5 mg       ANTICOAG SUMMARY: Anticoagulation Episode Summary    Current INR goal 2.0-3.0  Next INR check 09/08/2015  INR from last check 2.80 (08/11/2015)  Weekly max dose   Target end date Indefinite  [facility]  Preferred lab   Send INR reminders to    Indications  DVT (deep venous thrombosis) (HCC) [I82.409]        Comments [facility]. 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        ANTICOAG TODAY: Anticoagulation Summary as of 08/11/2015    INR goal 2.0-3.0  Selected INR 2.80 (08/11/2015)  Next INR check 09/08/2015  Target end date Indefinite   Indications  DVT (deep venous thrombosis) (HCC) [I82.409]      Anticoagulation Episode Summary    [facility]   Preferred lab    Send INR reminders to    [facility]. 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      PATIENT INSTRUCTIONS: Patient Instructions  Patient instructed to take medications as defined in the Anti-coagulation Track section of this encounter.  Patient instructed to take today's dose.  Patient verbalized  understanding of these instructions.       FOLLOW-UP Return in 4 weeks (on 09/08/2015) for Follow up INR at 2:30PM.  Hulen LusterJames Marie Willis, [name] Pharm.D., CACP

## 2015-08-11 NOTE — Patient Instructions (Signed)
Patient instructed to take medications as defined in the Anti-coagulation Track section of this encounter.  Patient instructed to take today's dose.  Patient verbalized understanding of these instructions.    

## 2015-08-12 NOTE — Progress Notes (Signed)
Indication: Recurrent deep venous thromboses. Duration: Indefinite. INR: At target. Agree with Dr. Groce's assessment and plan. 

## 2015-08-27 ENCOUNTER — Ambulatory Visit (INDEPENDENT_AMBULATORY_CARE_PROVIDER_SITE_OTHER): Payer: Medicaid Other | Admitting: Internal Medicine

## 2015-08-27 ENCOUNTER — Encounter: Payer: Self-pay | Admitting: Internal Medicine

## 2015-08-27 VITALS — BP 104/66 | HR 68 | Temp 98.0°F | Ht 65.0 in | Wt 206.2 lb

## 2015-08-27 DIAGNOSIS — M25562 Pain in left knee: Secondary | ICD-10-CM | POA: Diagnosis not present

## 2015-08-27 MED ORDER — MELOXICAM 7.5 MG PO TABS: 7.5000 mg | ORAL_TABLET | Freq: Every day | ORAL | Status: DC

## 2015-08-27 MED ORDER — DICLOFENAC SODIUM 1 % TD GEL: 4.0000 g | Freq: Four times a day (QID) | TRANSDERMAL | Status: DC

## 2015-08-27 NOTE — Patient Instructions (Signed)
Ms. Marie Willis,  It was a pleasure meeting you today.  I have a strong suspicion that your left knee pain is due to osteoarthritis. For this, we will try Meloxicam 7.5 mg  (Mobic) once daily for two weeks. In addition, I will prescribe Voltaren gel that you can continue to use beyond two weeks.   If this does not improve in two weeks, please schedule a follow up appointment.   I have low suspicion for DVT. Regardless, the treatment would be Warfarin, which you are currently on.

## 2015-08-27 NOTE — Assessment & Plan Note (Signed)
A: Patient has known osteoarthritis. Wells score for DVT is -1, also, she is already on Warfarin and INRs have been therapeutic. This is c/w a flare of osteoarthritis. Despite Cr of 1.3 on last BMET, I am comfortable with a short course of meloxicam. She denies any history of dark stools or stomach ulcers. Afterward, she can continue to use Voltaren gel. If this is not effective in 2 weeks, she may need a steroid injection, although she reports an adverse reaction to topical steroids in the past (hives). She was instructed to go to the ED if she felt sudden chest pain or dyspnea, and she verbalized understanding.  P:  Meloxicam 7.5 mg daily x 14 days Voltaren Gel

## 2015-08-27 NOTE — Progress Notes (Signed)
   Subjective:    Patient ID: Marie Willis, female    DOB: 02/02/1956, 60 y.o.   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 She describes it as if "someone had taken off my left leg and reattached it the wrong way." The patient had been doing some more gardening last month, which she thinks led to her recent episode. She has been using a cane recently, which she normally doesn't. She has a longstanding history of osteoarthritis "since [her] 4420s." She says this is not similar to the DVTs she's had on her right side endorses only minimal swelling of her right knee. She denies any recent immobility, surgeries. She denies chest pain or dyspnea. She continues to smoke. She has been adherent with her Warfarin therapy.     Review of Systems  Constitutional: Negative for fever and fatigue.  Respiratory: Negative for cough and shortness of breath.   Cardiovascular: Negative for chest pain.  Musculoskeletal: Positive for joint swelling, arthralgias and gait problem.       Objective:   Physical Exam  Constitutional: She appears well-developed. No distress.  Cardiovascular: Normal rate, regular rhythm and normal heart sounds.   Pulmonary/Chest: Effort normal and breath sounds normal. No respiratory distress. She has no wheezes.  Musculoskeletal:  Flexion of left knee limited by pain. Both left and right calves are 40.5 cm. No appreciable erythema or swelling. No palpable cords or point tenderness in the legs.  Neurological:  Sensation symmetric to light touch bilaterally in lower extremities.  Skin: Skin is warm and dry.  Psychiatric: She has a normal mood and affect. Her behavior is normal.  Vitals reviewed.         Assessment & Plan:   Please see problem based assessment and plan for details.

## 2015-08-28 NOTE — Progress Notes (Signed)
[facility] Clinic Attending  Case discussed with Dr. Ford at the time of the visit.  We reviewed the resident's history and exam and pertinent patient test results.  I agree with the assessment, diagnosis, and plan of care documented in the resident's note.  

## 2015-09-08 ENCOUNTER — Ambulatory Visit (INDEPENDENT_AMBULATORY_CARE_PROVIDER_SITE_OTHER): Payer: Medicaid Other | Admitting: Pharmacist

## 2015-09-08 DIAGNOSIS — Z7901 Long term (current) use of anticoagulants: Secondary | ICD-10-CM

## 2015-09-08 DIAGNOSIS — I82401 Acute embolism and thrombosis of unspecified deep veins of right lower extremity: Secondary | ICD-10-CM | POA: Diagnosis present

## 2015-09-08 LAB — POCT INR: INR: 3

## 2015-09-08 NOTE — Progress Notes (Signed)
Anti-Coagulation Progress Note  Marie Willis is a 60 y.o. female who is currently on an anti-coagulation regimen.    RECENT RESULTS: Recent results are below, the most recent result is correlated with a dose of 42.5 mg. per week: Lab Results  Component Value Date   INR 3.0 09/08/2015   INR 2.80 08/11/2015   INR 2.7 07/14/2015    ANTI-COAG DOSE: Anticoagulation Dose Instructions as of 09/08/2015      Glynis SmilesSun Mon Tue Wed Thu Fri Sat   New Dose 5 mg 5 mg 5 mg 7.5 mg 5 mg 7.5 mg 5 mg       ANTICOAG SUMMARY: Anticoagulation Episode Summary    Current INR goal 2.0-3.0  Next INR check 09/29/2015  INR from last check 3.0 (09/08/2015)  Weekly max dose   Target end date Indefinite  [facility]  Preferred lab   Send INR reminders to    Indications  DVT (deep venous thrombosis) (HCC) [I82.409]        Comments [facility] for DVT. 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        ANTICOAG TODAY: Anticoagulation Summary as of 09/08/2015    INR goal 2.0-3.0  Selected INR 3.0 (09/08/2015)  Next INR check 09/29/2015  Target end date Indefinite   Indications  DVT (deep venous thrombosis) (HCC) [I82.409]      Anticoagulation Episode Summary    [facility]   Preferred lab    Send INR reminders to    Comments [facility]VT. 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      PATIENT INSTRUCTIONS: Patient Instructions  Patient instructed to take medications as defined in the Anti-coagulation Track section of this encounter.  Patient instructed to take today's dose.  Patient verbalized understanding  of these instructions.       FOLLOW-UP Return in 3 weeks (on 09/29/2015) for Follow up INR at 2:45PM.  Hulen LusterJames Phelix Fudala, [name] Pharm.D., CACP

## 2015-09-08 NOTE — Patient Instructions (Signed)
Patient instructed to take medications as defined in the Anti-coagulation Track section of this encounter.  Patient instructed to take today's dose.  Patient verbalized understanding of these instructions.    

## 2015-09-08 NOTE — Progress Notes (Signed)
Indication: Recurrent deep venous thrombosis Duration: Indefinite INR: At target.  Dr. Saralyn PilarGroce's assessment and plan were reviewed and I agree with his documentation.

## 2015-09-29 ENCOUNTER — Ambulatory Visit (INDEPENDENT_AMBULATORY_CARE_PROVIDER_SITE_OTHER): Payer: Medicaid Other | Admitting: Pharmacist

## 2015-09-29 DIAGNOSIS — I82422 Acute embolism and thrombosis of left iliac vein: Secondary | ICD-10-CM | POA: Diagnosis present

## 2015-09-29 DIAGNOSIS — Z7901 Long term (current) use of anticoagulants: Secondary | ICD-10-CM

## 2015-09-29 LAB — POCT INR: INR: 3.4

## 2015-09-29 MED ORDER — WARFARIN SODIUM 5 MG PO TABS: 5.0000 mg | ORAL_TABLET | Freq: Every day | ORAL | Status: DC

## 2015-09-29 NOTE — Addendum Note (Signed)
Addended by: Hulen LusterGROCE, Edgardo Petrenko B on: 09/29/2015 02:06 PM   Modules accepted: Orders

## 2015-09-29 NOTE — Progress Notes (Signed)
INTERNAL MEDICINE TEACHING ATTENDING ADDENDUM - Kairee Kozma M.D  Duration- indefinite, Indication- recurrent DVT, INR- supratherapeutic. Agree with pharmacy recommendations as outlined in their note.  

## 2015-09-29 NOTE — Progress Notes (Signed)
Anti-Coagulation Progress Note  Marie Willis is a 60 y.o. female who is currently on an anti-coagulation regimen.    RECENT RESULTS: Recent results are below, the most recent result is correlated with a dose of 35 mg. per week: Lab Results  Component Value Date   INR 3.40 09/29/2015   INR 3.0 09/08/2015   INR 2.80 08/11/2015    ANTI-COAG DOSE: Anticoagulation Dose Instructions as of 09/29/2015      Glynis SmilesSun Mon Tue Wed Thu Fri Sat   New Dose 5 mg 5 mg 5 mg 5 mg 5 mg 5 mg 5 mg       ANTICOAG SUMMARY: Anticoagulation Episode Summary    Current INR goal 2.0-3.0  Next INR check 10/20/2015  INR from last check 3.40! (09/29/2015)  Weekly max dose   Target end date Indefinite  [facility]  Preferred lab   Send INR reminders to    Indications  DVT (deep venous thrombosis) (HCC) [I82.409]        Comments [facility]. 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        ANTICOAG TODAY: Anticoagulation Summary as of 09/29/2015    INR goal 2.0-3.0  Selected INR 3.40! (09/29/2015)  Next INR check 10/20/2015  Target end date Indefinite   Indications  DVT (deep venous thrombosis) (HCC) [I82.409]      Anticoagulation Episode Summary    [facility]   Preferred lab    Send INR reminders to    [facility]. 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      PATIENT INSTRUCTIONS: Patient Instructions  Patient instructed to take medications as defined in the Anti-coagulation Track section of this encounter.  Patient instructed to take today's dose.  Patient verbalized understanding of  these instructions.       FOLLOW-UP Return in 3 weeks (on 10/20/2015) for Follow up INR at 2PM.  Hulen LusterJames Rowan Blaker, [name] Pharm.D., CACP

## 2015-09-29 NOTE — Patient Instructions (Signed)
Patient instructed to take medications as defined in the Anti-coagulation Track section of this encounter.  Patient instructed to take today's dose.  Patient verbalized understanding of these instructions.    

## 2015-10-05 ENCOUNTER — Other Ambulatory Visit: Payer: Self-pay | Admitting: Internal Medicine

## 2015-10-06 ENCOUNTER — Encounter: Payer: Self-pay | Admitting: *Deleted

## 2015-10-20 ENCOUNTER — Ambulatory Visit (INDEPENDENT_AMBULATORY_CARE_PROVIDER_SITE_OTHER): Payer: Medicaid Other | Admitting: Pharmacist

## 2015-10-20 DIAGNOSIS — Z7901 Long term (current) use of anticoagulants: Secondary | ICD-10-CM | POA: Diagnosis not present

## 2015-10-20 DIAGNOSIS — I82A29 Chronic embolism and thrombosis of unspecified axillary vein: Secondary | ICD-10-CM | POA: Diagnosis present

## 2015-10-20 LAB — POCT INR: INR: 3.5

## 2015-10-20 NOTE — Patient Instructions (Signed)
Patient instructed to take medications as defined in the Anti-coagulation Track section of this encounter.  Patient instructed to take today's dose.  Patient verbalized understanding of these instructions.    

## 2015-10-20 NOTE — Progress Notes (Signed)
Anti-Coagulation Progress Note  Marie CargoSusan Willis is a 60 y.o. female who is currently on an anti-coagulation regimen.    RECENT RESULTS: Recent results are below, the most recent result is correlated with a dose of 35 mg. per week: Lab Results  Component Value Date   INR 3.50 10/20/2015   INR 3.40 09/29/2015   INR 3.0 09/08/2015    ANTI-COAG DOSE: Anticoagulation Dose Instructions as of 10/20/2015      Glynis SmilesSun Mon Tue Wed Thu Fri Sat   New Dose 5 mg 2.5 mg 5 mg 5 mg 5 mg 5 mg 5 mg       ANTICOAG SUMMARY: Anticoagulation Episode Summary    Current INR goal 2.0-3.0  Next INR check 11/03/2015  INR from last check 3.50! (10/20/2015)  Weekly max dose   Target end date Indefinite  [facility]  Preferred lab   Send INR reminders to    Indications  DVT (deep venous thrombosis) (HCC) [I82.409]        Comments [facility]. 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        ANTICOAG TODAY: Anticoagulation Summary as of 10/20/2015    INR goal 2.0-3.0  Selected INR 3.50! (10/20/2015)  Next INR check 11/03/2015  Target end date Indefinite   Indications  DVT (deep venous thrombosis) (HCC) [I82.409]      Anticoagulation Episode Summary    [facility]   Preferred lab    Send INR reminders to    [facility]. 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      PATIENT INSTRUCTIONS: Patient Instructions  Patient instructed to take medications as defined in the Anti-coagulation Track section of this encounter.  Patient instructed to take today's dose.  Patient verbalized  understanding of these instructions.       FOLLOW-UP Return in 2 weeks (on 11/03/2015) for Follow up INR at 2:45PM.  Hulen LusterJames Bastien Strawser, [name] Pharm.D., CACP

## 2015-11-03 ENCOUNTER — Ambulatory Visit (INDEPENDENT_AMBULATORY_CARE_PROVIDER_SITE_OTHER): Payer: Medicaid Other | Admitting: Pharmacist

## 2015-11-03 DIAGNOSIS — Z7901 Long term (current) use of anticoagulants: Secondary | ICD-10-CM | POA: Diagnosis not present

## 2015-11-03 DIAGNOSIS — I82401 Acute embolism and thrombosis of unspecified deep veins of right lower extremity: Secondary | ICD-10-CM

## 2015-11-03 LAB — POCT INR: INR: 2.1

## 2015-11-03 NOTE — Patient Instructions (Signed)
Patient instructed to take medications as defined in the Anti-coagulation Track section of this encounter.  Patient instructed to take today's dose.  Patient verbalized understanding of these instructions.    

## 2015-11-03 NOTE — Progress Notes (Signed)
Anti-Coagulation Progress Note  Marie Willis is a 60 y.o. female who is currently on an anti-coagulation regimen.    RECENT RESULTS: Recent results are below, the most recent result is correlated with a dose of 32.5 mg. per week: Lab Results  Component Value Date   INR 2.10 11/03/2015   INR 3.50 10/20/2015   INR 3.40 09/29/2015    ANTI-COAG DOSE: Anticoagulation Dose Instructions as of 11/03/2015      Marie SmilesSun Mon Tue Wed Thu Fri Sat   New Dose 5 mg 5 mg 5 mg 5 mg 2.5 mg 5 mg 5 mg       ANTICOAG SUMMARY: Anticoagulation Episode Summary    Current INR goal 2.0-3.0  Next INR check 12/01/2015  INR from last check 2.10 (11/03/2015)  Weekly max dose   Target end date Indefinite  [facility]  Preferred lab   Send INR reminders to    Indications  DVT (deep venous thrombosis) (HCC) [I82.409]        [facility]. 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        ANTICOAG TODAY: Anticoagulation Summary as of 11/03/2015    INR goal 2.0-3.0  Selected INR 2.10 (11/03/2015)  Next INR check 12/01/2015  Target end date Indefinite   Indications  DVT (deep venous thrombosis) (HCC) [I82.409]      Anticoagulation Episode Summary    [facility]   Preferred lab    Send INR reminders to    [facility]. 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      PATIENT INSTRUCTIONS: Patient Instructions  Patient instructed to take medications as defined in the Anti-coagulation Track section of this encounter.  Patient instructed to take today's dose.  Patient verbalized  understanding of these instructions.       FOLLOW-UP Return in 4 weeks (on 12/01/2015) for Follow up INR at 2PM.  Marie Willis, [name] Pharm.D., CACP

## 2015-11-04 NOTE — Progress Notes (Signed)
I have reviewed Dr. Groce's note.  Patient is on AC for recurrent DVT.  Her INR is at goal.  

## 2015-11-24 ENCOUNTER — Other Ambulatory Visit: Payer: Self-pay | Admitting: Internal Medicine

## 2015-12-01 ENCOUNTER — Ambulatory Visit (INDEPENDENT_AMBULATORY_CARE_PROVIDER_SITE_OTHER): Payer: Medicaid Other | Admitting: Pharmacist

## 2015-12-01 DIAGNOSIS — I82401 Acute embolism and thrombosis of unspecified deep veins of right lower extremity: Secondary | ICD-10-CM | POA: Diagnosis present

## 2015-12-01 DIAGNOSIS — Z7901 Long term (current) use of anticoagulants: Secondary | ICD-10-CM | POA: Diagnosis not present

## 2015-12-01 LAB — POCT INR: INR: 2.7

## 2015-12-01 NOTE — Patient Instructions (Signed)
Patient educated about medication as defined in this encounter and verbalized understanding by repeating back instructions provided.   

## 2015-12-03 NOTE — Progress Notes (Signed)
INTERNAL MEDICINE TEACHING ATTENDING ADDENDUM - Duncan Vincent M.D  Duration- indefinite, Indication- recurrent VTE, INR- therapeutic. Agree with pharmacy recommendations as outlined in their note.   

## 2016-01-05 ENCOUNTER — Ambulatory Visit (INDEPENDENT_AMBULATORY_CARE_PROVIDER_SITE_OTHER): Payer: Medicaid Other | Admitting: *Deleted

## 2016-01-05 ENCOUNTER — Ambulatory Visit (INDEPENDENT_AMBULATORY_CARE_PROVIDER_SITE_OTHER): Payer: Medicaid Other

## 2016-01-05 DIAGNOSIS — Z7901 Long term (current) use of anticoagulants: Secondary | ICD-10-CM

## 2016-01-05 DIAGNOSIS — Z23 Encounter for immunization: Secondary | ICD-10-CM | POA: Diagnosis not present

## 2016-01-05 DIAGNOSIS — I82401 Acute embolism and thrombosis of unspecified deep veins of right lower extremity: Secondary | ICD-10-CM

## 2016-01-05 LAB — POCT INR: INR: 2.7

## 2016-01-05 NOTE — Progress Notes (Signed)
Anti-Coagulation Progress Note  Marie CargoSusan Mandel is a 60 y.o. female who is currently on an anti-coagulation regimen.    RECENT RESULTS: Recent results are below, the most recent result is correlated with a dose of 32.5 mg. per week: Lab Results  Component Value Date   INR 2.7 01/05/2016   INR 2.7 12/01/2015   INR 2.10 11/03/2015    ANTI-COAG DOSE: Anticoagulation Dose Instructions as of 01/05/2016      Glynis SmilesSun Mon Tue Wed Thu Fri Sat   New Dose 5 mg 5 mg 5 mg 5 mg 2.5 mg 5 mg 5 mg       ANTICOAG SUMMARY: Anticoagulation Episode Summary    Current INR goal:   2.0-3.0  TTR:   60.1 % (4.9 y)  Next INR check:   01/26/2016  INR from last check:   2.7 (01/05/2016)  Weekly max dose:     Target end date:   Indefinite  INR check location[facility]   [facility]  Preferred lab:     Send INR reminders to:      Indications   DVT (deep venous thrombosis) (HCC) [I82.409]       Comments:   [facility] for DVT. 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        ANTICOAG TODAY: Anticoagulation Summary  As of 01/05/2016   INR goal:   2.0-3.0  TTR:     Today's INR:   2.7  Next INR check:   01/26/2016  Target end date:   Indefinite   Indications   DVT (deep venous thrombosis) (HCC) [I82.409]        Anticoagulation Episode Summary    INR check [facility]   [facility]   Preferred lab:      Send INR reminders to:      Comments:   [facility] for [facility]. 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      PATIENT INSTRUCTIONS: There are no Patient Instructions on file for this visit.   FOLLOW-UP Return in about 3 weeks (around 01/26/2016) for Follow up  INR at 2PM .  York CeriseKatherine Cook, PharmD Pharmacy Resident  Pager [phone] 01/05/16 3:53 PM

## 2016-01-05 NOTE — Patient Instructions (Signed)
Patient instructed to take medications as defined in the Anti-coagulation Track section of this encounter.  Patient instructed to take today's dose.  Patient verbalized understanding of these instructions.    

## 2016-01-07 ENCOUNTER — Other Ambulatory Visit: Payer: Self-pay | Admitting: Internal Medicine

## 2016-01-07 ENCOUNTER — Other Ambulatory Visit: Payer: Self-pay | Admitting: Student in an Organized Health Care Education/Training Program

## 2016-01-09 ENCOUNTER — Other Ambulatory Visit: Payer: Self-pay | Admitting: Student in an Organized Health Care Education/Training Program

## 2016-01-09 NOTE — Telephone Encounter (Signed)
Short course of meloxicam prescribed for subacute knee pain, not as chronic medicine.  Her knee should be re-evaluated.

## 2016-01-14 NOTE — Progress Notes (Signed)
I have reviewed coumadin clinic note.  [facility].  INR at goal.

## 2016-01-17 ENCOUNTER — Other Ambulatory Visit: Payer: Self-pay | Admitting: Internal Medicine

## 2016-01-26 ENCOUNTER — Ambulatory Visit (INDEPENDENT_AMBULATORY_CARE_PROVIDER_SITE_OTHER): Payer: Medicaid Other

## 2016-01-26 DIAGNOSIS — Z7901 Long term (current) use of anticoagulants: Secondary | ICD-10-CM

## 2016-01-26 DIAGNOSIS — I82401 Acute embolism and thrombosis of unspecified deep veins of right lower extremity: Secondary | ICD-10-CM | POA: Diagnosis not present

## 2016-01-26 LAB — POCT INR: INR: 3.1

## 2016-01-26 NOTE — Progress Notes (Signed)
Anti-Coagulation Progress Note  Ebony CargoSusan Steppe is a 60 y.o. female who is currently on an anti-coagulation regimen.    RECENT RESULTS: Recent results are below, the most recent result is correlated with a dose of 32.5 mg. per week: Lab Results  Component Value Date   INR 3.1 01/26/2016   INR 2.7 01/05/2016   INR 2.7 12/01/2015    ANTI-COAG DOSE: Anticoagulation Dose Instructions as of 01/26/2016      Glynis SmilesSun Mon Tue Wed Thu Fri Sat   New Dose 5 mg 5 mg 5 mg 5 mg 2.5 mg 5 mg 5 mg       ANTICOAG SUMMARY: Anticoagulation Episode Summary    Current INR goal:   2.0-3.0  TTR:   60.3 % (5 y)  Next INR check:   02/16/2016  INR from last check:     Weekly max dose:     Target end date:   Indefinite  [facility]   [facility]  Preferred lab:     Send INR reminders to:      Indications   DVT (deep venous thrombosis) (HCC) [I82.409]       Comments:   [facility] for DVT. 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        ANTICOAG TODAY: Anticoagulation Summary  As of 01/26/2016   INR goal:   2.0-3.0  TTR:     Today's INR:     Next INR check:   02/16/2016  Target end date:   Indefinite   Indications   DVT (deep venous thrombosis) (HCC) [I82.409]        Anticoagulation Episode Summary    INR check [facility]   [facility]   Preferred lab:      Send INR reminders to:      Comments:   [facility] for DVT. 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      PATIENT INSTRUCTIONS: There are no Patient Instructions on file for this visit.   FOLLOW-UP Return in about 3 weeks (around 02/16/2016) for Follow-up INR at  1400.  York CeriseKatherine Cook, PharmD Pharmacy Resident  Pager [phone] 01/26/16 3:08 PM

## 2016-01-26 NOTE — Patient Instructions (Signed)
Patient instructed to take medications as defined in the Anti-coagulation Track section of this encounter.  Patient instructed to take today's dose.  Patient verbalized understanding of these instructions.    

## 2016-02-16 ENCOUNTER — Ambulatory Visit: Payer: Medicaid Other

## 2016-02-23 ENCOUNTER — Ambulatory Visit: Payer: Medicaid Other

## 2016-03-01 ENCOUNTER — Ambulatory Visit: Payer: Medicaid Other

## 2016-03-08 ENCOUNTER — Ambulatory Visit (INDEPENDENT_AMBULATORY_CARE_PROVIDER_SITE_OTHER): Payer: Medicaid Other | Admitting: Internal Medicine

## 2016-03-08 ENCOUNTER — Ambulatory Visit (INDEPENDENT_AMBULATORY_CARE_PROVIDER_SITE_OTHER): Payer: Medicaid Other | Admitting: Pharmacist

## 2016-03-08 ENCOUNTER — Encounter: Payer: Self-pay | Admitting: Internal Medicine

## 2016-03-08 DIAGNOSIS — F17210 Nicotine dependence, cigarettes, uncomplicated: Secondary | ICD-10-CM

## 2016-03-08 DIAGNOSIS — Z86718 Personal history of other venous thrombosis and embolism: Secondary | ICD-10-CM | POA: Diagnosis not present

## 2016-03-08 DIAGNOSIS — I82401 Acute embolism and thrombosis of unspecified deep veins of right lower extremity: Secondary | ICD-10-CM | POA: Diagnosis not present

## 2016-03-08 DIAGNOSIS — Z7901 Long term (current) use of anticoagulants: Secondary | ICD-10-CM | POA: Diagnosis not present

## 2016-03-08 DIAGNOSIS — R252 Cramp and spasm: Secondary | ICD-10-CM | POA: Diagnosis present

## 2016-03-08 HISTORY — DX: Cramp and spasm: R25.2

## 2016-03-08 LAB — POCT INR: INR: 1.7

## 2016-03-08 NOTE — Assessment & Plan Note (Addendum)
Assessment Today, she was found to have a subtherapeutic INR 1.7 with target 2-3 for prior history of unprovoked DVTs, most recently 2015. Over the last 6-8 months, she has noted problems with the right thigh muscle. When she is exerting herself, she feels it catching. At nighttime, the cramping awakens her. She has noted similar symptoms on her left side albeit less severe. She has been smoking since her late teenage years for a variable amount, as high as 2 more packs a day, though she quit for 7 years in the interim. She denies a prior history of strokes or heart attacks. She cannot describe how far she can actually exert herself before her muscles become a nuisance as she finds it difficult to catch her breath due to her inciting for which she sees a therapist twice weekly.  Her physical exam is reassuring for no acute limb ischemia though peripheral vascular disease remains in my differential given her significant tobacco use. She could also have long-standing changes in her vasculature from having had multiple episodes of thromboembolic disease, most recently 2015.  Plan -Check ABIs to assess for peripheral vascular disease. I offered to check basic Dopplers in the office today though she declined. -Repeat BMET and CBC as she has not had these labs checked in one year  ADDENDUM 04/06/2016  7:09 PM:  BMET notable for CKD Stage 3 though Crt 1.7 which is the lower range of GFR for this degree of renal insufficiency. I am not sure if she has already been referred to Nephrology but would recommend it.  CBC with Hb is down 1 point from prior values though there are not many in the system.   I will forward to PCP to review these results as well.  ADDENDUM 04/23/2016  2:18 PM:  ABI reassuring for no arterial insufficiency. If she is still having cramps at follow-up, other causes should be considered. I will send her these results by mail upon returning next week.

## 2016-03-08 NOTE — Patient Instructions (Signed)
Patient instructed to take medications as defined in the Anti-coagulation Track section of this encounter.  Patient instructed to take today's dose.  Patient verbalized understanding of these instructions.    

## 2016-03-08 NOTE — Progress Notes (Signed)
   CC: Right thigh pain  HPI:  [name] is a 60 y.o. female who presents today for right thigh pain. Please see assessment & plan for status of chronic medical problems.   Past Medical History:  Diagnosis Date  . Anxiety   . Depression   . Hypertension   . Low back pain     Review of Systems:  Please see each problem below for a pertinent review of systems.  Physical Exam:  Vitals:   03/08/16 1500  BP: 118/76  Pulse: 78  Temp: 98.4 F (36.9 C)  TempSrc: Oral  SpO2: 100%  Weight: 206 lb 4.8 oz (93.6 kg)  Height: 5\' 5"  (1.651 m)   Physical Exam  Constitutional: She is oriented to person, place, and time. No distress.  HENT:  Head: Normocephalic.  Mouth/Throat: No oropharyngeal exudate.  Eyes: Conjunctivae are normal. No scleral icterus.  Pulmonary/Chest: Effort normal. No respiratory distress.  Neurological: She is alert and oriented to person, place, and time.  Skin: Skin is warm and dry. She is not diaphoretic.  Bilateral feet without color changes suggestive of ischemia. Difficult to palpate her cells pedis and posterior tibialis pulses bilaterally. No signs of hair loss or skin atrophy.     Assessment & Plan:   See Encounters Tab for problem based charting.  Patient discussed with Dr. Criselda PeachesMullen

## 2016-03-08 NOTE — Progress Notes (Signed)
Anti-Coagulation Progress Note  Marie Willis is a 60 y.o. female who is currently on an anti-coagulation regimen.    RECENT RESULTS: Recent results are below, the most recent result is correlated with a dose of 32.5 mg. per week: Lab Results  Component Value Date   INR 1.7 03/08/2016   INR 3.1 01/26/2016   INR 2.7 01/05/2016    ANTI-COAG DOSE: Anticoagulation Dose Instructions as of 03/08/2016      Glynis SmilesSun Mon Tue Wed Thu Fri Sat   New Dose 5 mg 5 mg 5 mg 5 mg 7.5 mg 5 mg 5 mg       ANTICOAG SUMMARY: Anticoagulation Episode Summary    Current INR goal:   2.0-3.0  TTR:   60.5 % (5.1 y)  Next INR check:   03/29/2016  INR from last check:     Weekly max dose:     Target end date:   Indefinite  [facility]   [facility]  Preferred lab:     Send INR reminders to:      Indications   DVT (deep venous thrombosis) (HCC) [I82.409]       Comments:   [facility] for [facility]. 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        ANTICOAG TODAY: Anticoagulation Summary  As of 03/08/2016   INR goal:   2.0-3.0  TTR:     Today's INR:     Next INR check:   03/29/2016  Target end date:   Indefinite   Indications   DVT (deep venous thrombosis) (HCC) [I82.409]        Anticoagulation Episode Summary    INR check [facility]   [facility]   Preferred lab:      Send INR reminders to:      Comments:   [facility] acute [facility] for DVT. 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     Patient has recently increased her intake of leafy green vegetables, which likely resulted in her subtherapeutic INR today. She stated that she would prefer to leave her  "different warfarin dose day" as Thursday. Increased total weekly warfarin dose by ~15%.  Patient also stated that she was having cramps in the back of her thigh, similar to the feeling of when she had a DVT. She denied redness around the area or recent activity increase to provoke cramps.   Given subtherapeutic INR and new cramps, discussed this with IM supervising provider, and they will see her in the clinic today to further evaluate this.   PATIENT INSTRUCTIONS: Patient instructed to take medications as defined in the Anti-coagulation Track section of this encounter.  Patient instructed to take today's dose.  Patient verbalized understanding of these instructions.   FOLLOW-UP Return in about 3 weeks (around 03/29/2016) for Follow up INR at 3:00PM.  Carylon PerchesMaggie Cortina Vultaggio, PharmD Acute Care Pharmacy Resident  Pager: [phone] 03/08/2016

## 2016-03-12 NOTE — Progress Notes (Signed)
Internal Medicine Clinic Attending  Case discussed with Dr. Patel,Rushil at the time of the visit.  We reviewed the resident's history and exam and pertinent patient test results.  I agree with the assessment, diagnosis, and plan of care documented in the resident's note.  

## 2016-03-12 NOTE — Progress Notes (Signed)
Agree with plan as documented.  [facility]

## 2016-03-15 ENCOUNTER — Telehealth: Payer: Self-pay | Admitting: Pharmacist

## 2016-03-15 NOTE — Telephone Encounter (Signed)
Called and left message--canceled her 4-DEC-17 INR appointment. Re-scheduled for Monday 11-DEC-17 at 2:30PM. Left my number in event she needed another day or time.

## 2016-03-25 ENCOUNTER — Other Ambulatory Visit: Payer: Medicaid Other

## 2016-04-01 ENCOUNTER — Other Ambulatory Visit: Payer: Medicaid Other

## 2016-04-03 ENCOUNTER — Other Ambulatory Visit: Payer: Self-pay | Admitting: Internal Medicine

## 2016-04-05 ENCOUNTER — Other Ambulatory Visit (INDEPENDENT_AMBULATORY_CARE_PROVIDER_SITE_OTHER): Payer: Medicaid Other

## 2016-04-05 ENCOUNTER — Ambulatory Visit (INDEPENDENT_AMBULATORY_CARE_PROVIDER_SITE_OTHER): Payer: Medicaid Other | Admitting: Pharmacist

## 2016-04-05 ENCOUNTER — Telehealth: Payer: Self-pay | Admitting: *Deleted

## 2016-04-05 DIAGNOSIS — R252 Cramp and spasm: Secondary | ICD-10-CM

## 2016-04-05 DIAGNOSIS — Z7901 Long term (current) use of anticoagulants: Secondary | ICD-10-CM

## 2016-04-05 DIAGNOSIS — I10 Essential (primary) hypertension: Secondary | ICD-10-CM

## 2016-04-05 DIAGNOSIS — I82501 Chronic embolism and thrombosis of unspecified deep veins of right lower extremity: Secondary | ICD-10-CM

## 2016-04-05 LAB — POCT INR: INR: 3.2

## 2016-04-05 NOTE — Telephone Encounter (Addendum)
BMET and CBC have been ordered.   ABI needs to be scheduled to rule out peripheral vascular disease.

## 2016-04-05 NOTE — Telephone Encounter (Signed)
Was here seeing dr Alexandria Lodgegroce and ask about studies that were ordered at recent visit, only thing noted was ABI's, she had refused other suggested diag studies, I do not see orders. Please advise

## 2016-04-05 NOTE — Progress Notes (Signed)
Anti-Coagulation Progress Note  Marie Willis is a 60 y.o. female who is currently on an anti-coagulation regimen.    RECENT RESULTS: Recent results are below, the most recent result is correlated with a dose of 37.5 mg. per week: Lab Results  Component Value Date   INR 3.20 04/05/2016   INR 1.7 03/08/2016   INR 3.1 01/26/2016    ANTI-COAG DOSE: Anticoagulation Dose Instructions as of 04/05/2016      Glynis SmilesSun Mon Tue Wed Thu Fri Sat   New Dose 5 mg 5 mg 5 mg 5 mg 5 mg 5 mg 5 mg       ANTICOAG SUMMARY: Anticoagulation Episode Summary    Current INR goal:   2.0-3.0  TTR:   60.6 % (5.2 y)  Next INR check:   05/03/2016  INR from last check:   3.20! (04/05/2016)  Weekly max dose:     Target end date:   Indefinite  INR check location[facility]   [facility]  Preferred lab:     Send INR reminders to:      Indications   DVT (deep venous thrombosis) (HCC) [I82.409]       Comments:   [facility] for [facility]. 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        ANTICOAG TODAY: Anticoagulation Summary  As of 04/05/2016   INR goal:   2.0-3.0  TTR:     Today's INR:   3.20!  Next INR check:   05/03/2016  Target end date:   Indefinite   Indications   DVT (deep venous thrombosis) (HCC) [I82.409]        Anticoagulation Episode Summary    INR check location:   [facility]   Preferred lab:      Send INR reminders to:      Comments:   [facility] for [facility]VT. 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      PATIENT INSTRUCTIONS: Patient instructed to take medications as defined in the Anti-coagulation Track section of this encounter.  Patient  instructed to take today's dose.  Patient instructed to take 1 tablet of your 5mg  warfarin tablets by mouth once daily.  Patient verbalized understanding of these instructions.     FOLLOW-UP Return in 4 weeks (on 05/03/2016) for Follow up INR at 2:30PM.  Hulen LusterJames Groce, [name] Pharm.D., CACP

## 2016-04-05 NOTE — Patient Instructions (Signed)
Patient instructed to take medications as defined in the Anti-coagulation Track section of this encounter.  Patient instructed to take today's dose.  Patient instructed to take 1 tablet of your 5mg  warfarin tablets by mouth once daily.  Patient verbalized understanding of these instructions.

## 2016-04-06 LAB — BMP8+ANION GAP
Anion Gap: 18 mmol/L (ref 10.0–18.0)
BUN / CREAT RATIO: 14 (ref 9–23)
BUN: 24 mg/dL (ref 6–24)
CHLORIDE: 101 mmol/L (ref 96–106)
CO2: 19 mmol/L (ref 18–29)
Calcium: 9.1 mg/dL (ref 8.7–10.2)
Creatinine, Ser: 1.7 mg/dL — ABNORMAL HIGH (ref 0.57–1.00)
GFR, EST AFRICAN AMERICAN: 38 mL/min/{1.73_m2} — AB (ref >59)
GFR, EST NON AFRICAN AMERICAN: 33 mL/min/{1.73_m2} — AB (ref >59)
GLUCOSE: 95 mg/dL (ref 65–99)
Potassium: 4.7 mmol/L (ref 3.5–5.2)
Sodium: 138 mmol/L (ref 134–144)

## 2016-04-06 LAB — CBC
HEMATOCRIT: 36.4 % (ref 34.0–46.6)
HEMOGLOBIN: 12.3 g/dL (ref 11.1–15.9)
MCH: 29.6 pg (ref 26.6–33.0)
MCHC: 33.8 g/dL (ref 31.5–35.7)
MCV: 88 fL (ref 79–97)
Platelets: 236 10*3/uL (ref 150–379)
RBC: 4.15 x10E6/uL (ref 3.77–5.28)
RDW: 14.1 % (ref 12.3–15.4)
WBC: 7.8 10*3/uL (ref 3.4–10.8)

## 2016-04-06 NOTE — Telephone Encounter (Signed)
Kayeg. Is scheduling ABI and I will call pt with the date and time

## 2016-04-13 ENCOUNTER — Ambulatory Visit (HOSPITAL_COMMUNITY): Payer: Medicaid Other

## 2016-04-22 ENCOUNTER — Ambulatory Visit (HOSPITAL_COMMUNITY)
Admission: RE | Admit: 2016-04-22 | Discharge: 2016-04-22 | Disposition: A | Discharge status: Home / Self Care | Payer: Medicaid Other | Source: Ambulatory Visit | Attending: Internal Medicine | Admitting: Internal Medicine

## 2016-04-22 ENCOUNTER — Ambulatory Visit (HOSPITAL_COMMUNITY): Payer: Medicaid Other

## 2016-04-22 DIAGNOSIS — R252 Cramp and spasm: Secondary | ICD-10-CM | POA: Insufficient documentation

## 2016-04-22 NOTE — Progress Notes (Signed)
VASCULAR LAB PRELIMINARY  ARTERIAL  ABI completed: bilateral within normal limits.    RIGHT    LEFT    PRESSURE WAVEFORM  PRESSURE WAVEFORM  BRACHIAL 115 Tri BRACHIAL 109 Tri  AT 115 Tri AT 118 Tri  PT 135 Bi PT 130 Bi    RIGHT LEFT  ABI 1.17 1.13    Farrel DemarkJill Eunice, RDMS, RVT  04/22/2016, 2:23 PM

## 2016-04-23 ENCOUNTER — Encounter: Payer: Self-pay | Admitting: Internal Medicine

## 2016-05-03 ENCOUNTER — Ambulatory Visit: Payer: Medicaid Other

## 2016-05-17 ENCOUNTER — Ambulatory Visit (INDEPENDENT_AMBULATORY_CARE_PROVIDER_SITE_OTHER): Payer: Medicaid Other | Admitting: Pharmacist

## 2016-05-17 DIAGNOSIS — I82501 Chronic embolism and thrombosis of unspecified deep veins of right lower extremity: Secondary | ICD-10-CM | POA: Diagnosis present

## 2016-05-17 DIAGNOSIS — Z7901 Long term (current) use of anticoagulants: Secondary | ICD-10-CM

## 2016-05-17 LAB — POCT INR: INR: 3.1

## 2016-05-17 NOTE — Patient Instructions (Signed)
Patient instructed to take medications as defined in the Anti-coagulation Track section of this encounter.  Patient instructed to take today's dose.  Patient instructed to take 1 tablet by mouth daily--EXCEPT on Mondays, take only 1/2 tablet on Mondays. Patient verbalized understanding of these instructions.

## 2016-05-17 NOTE — Progress Notes (Signed)
Anti-Coagulation Progress Note  Marie Willis is a 61 y.o. female who is currently on an anti-coagulation reEbony Cargogimen.    RECENT RESULTS: Recent results are below, the most recent result is correlated with a dose of 35 mg. per week: Lab Results  Component Value Date   INR 3.10 05/17/2016   INR 3.20 04/05/2016   INR 1.7 03/08/2016    ANTI-COAG DOSE: Anticoagulation Dose Instructions as of 05/17/2016      Glynis SmilesSun Mon Tue Wed Thu Fri Sat   New Dose 5 mg 2.5 mg 5 mg 5 mg 5 mg 5 mg 5 mg    Description   Take 1 tablet by mouth daily--EXCEPT on Mondays, take only 1/2 tablet on Mondays.       ANTICOAG SUMMARY: Anticoagulation Episode Summary    Current INR goal:   2.0-3.0  TTR:   59.3 % (5.3 y)  Next INR check:   06/14/2016  INR from last check:   3.10! (05/17/2016)  Weekly max dose:     Target end date:   Indefinite  [facility]R check location[facility]   [facility]  Preferred lab:     Send INR reminders to:      Indications   DVT (deep venous thrombosis) (HCC) [I82.409]       Comments:   [facility] for DVT. 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        ANTICOAG TODAY: Anticoagulation Summary  As of 05/17/2016   INR goal:   2.0-3.0  TTR:     Today's INR:   3.10!  Next INR check:   06/14/2016  Target end date:   Indefinite   Indications   DVT (deep venous thrombosis) (HCC) [I82.409]        Anticoagulation Episode Summary    INR check location[facility]   [facility]   Preferred lab:      Send INR reminders to:      Comments:   [facility] for [facility]. 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      PATIENT INSTRUCTIONS: Take 1 tablet by mouth  daily--EXCEPT on Mondays, take only 1/2 tablet on Mondays.    FOLLOW-UP Return in 4 weeks (on 06/14/2016) for Follow up INR at 3PM.  Hulen LusterJames Groce, [name] Pharm.D., CACP

## 2016-05-17 NOTE — Progress Notes (Signed)
INTERNAL MEDICINE TEACHING ATTENDING ADDENDUM - Mavryk Pino M.D  Duration- indefnite, Indication- recurrent DVT, INR- supratherapeutic. Agree with pharmacy recommendations as outlined in their note.

## 2016-05-30 NOTE — Progress Notes (Deleted)
   CC: ***  HPI:  Ms.Ortha Su HiltRoberts is a 61 y.o. woman with history of HTN, CKD3, and DVT (multiple unprovoked, on warfarin) who presents for management of hypertension.  Please see A&P for status of the patient's chronic medical conditions.   Past Medical History:  Diagnosis Date  . Anxiety   . Depression   . Hypertension   . Low back pain     Review of Systems:  ***  Physical Exam:  There were no vitals filed for this visit. ***  Assessment & Plan:   See Encounters Tab for problem based charting.  Patient {GC/GE:[id number]::"discussed with","seen with"} Dr. {NAMES:[id number]::"Butcher","Granfortuna","E. Hoffman","Klima","Mullen","Narendra","Vincent"}

## 2016-05-30 NOTE — Assessment & Plan Note (Deleted)
Lab Results  Component Value Date   INR 3.10 05/17/2016   INR 3.20 04/05/2016   INR 1.7 03/08/2016  INR goal 2-3  Follows w/ Dr Alexandria LodgeGroce in [facility].  On warfarin 2.5 mg Mon 5 mg Tues-Sun.

## 2016-05-30 NOTE — Assessment & Plan Note (Deleted)
  BMP Latest Ref Rng & Units 04/05/2016 04/22/2015 06/27/2014  Glucose 65 - 99 mg/dL 95 409(W123(H) 119(J141(H)  BUN 6 - 24 mg/dL 24 17 17   Creatinine 0.57 - 1.00 mg/dL 4.78(G1.70(H) 9.56(O1.26(H) 1.30(Q1.35(H)  BUN/Creat Ratio 9 - 23 14 13  -  Sodium 134 - 144 mmol/L 138 142 136  Potassium 3.5 - 5.2 mmol/L 4.7 4.1 4.5  Chloride 96 - 106 mmol/L 101 99 103  CO2 18 - 29 mmol/L 19 23 24   Calcium 8.7 - 10.2 mg/dL 9.1 9.3 9.3   -avoid NSAIDS -repeat BMP today -discuss nephrology referral

## 2016-05-30 NOTE — Assessment & Plan Note (Deleted)
BP Readings from Last 3 Encounters:  03/08/16 118/76  08/27/15 104/66  04/22/15 106/80   Lab Results  Component Value Date   CREATININE 1.70 (H) 04/05/2016   Lab Results  Component Value Date   K 4.7 04/05/2016    Current medications: lisinopril 20 mg daily  Assessment BP goal: <140/90 BP control: controlled  Plan Medications: continue lisinopril 20 mg daily Other:

## 2016-05-30 NOTE — Assessment & Plan Note (Deleted)
Mammogram Pap smear Colonoscopy

## 2016-05-31 ENCOUNTER — Ambulatory Visit (INDEPENDENT_AMBULATORY_CARE_PROVIDER_SITE_OTHER): Payer: Medicaid Other | Admitting: Pharmacist

## 2016-05-31 DIAGNOSIS — I82501 Chronic embolism and thrombosis of unspecified deep veins of right lower extremity: Secondary | ICD-10-CM

## 2016-05-31 DIAGNOSIS — I82509 Chronic embolism and thrombosis of unspecified deep veins of unspecified lower extremity: Secondary | ICD-10-CM | POA: Diagnosis not present

## 2016-05-31 DIAGNOSIS — I10 Essential (primary) hypertension: Secondary | ICD-10-CM

## 2016-05-31 DIAGNOSIS — N183 Chronic kidney disease, stage 3 unspecified: Secondary | ICD-10-CM

## 2016-05-31 DIAGNOSIS — Z7901 Long term (current) use of anticoagulants: Secondary | ICD-10-CM

## 2016-05-31 DIAGNOSIS — Z0000 Encounter for general adult medical examination without abnormal findings: Secondary | ICD-10-CM

## 2016-05-31 LAB — POCT INR: INR: 3.2

## 2016-05-31 MED ORDER — WARFARIN SODIUM 5 MG PO TABS: ORAL_TABLET | ORAL | 2 refills | Status: DC

## 2016-05-31 NOTE — Progress Notes (Signed)
Anticoagulation Management Marie CargoSusan Willis is a 61 y.o. female who reports to the clinic for monitoring of warfarin treatment.    Indication: DVT history, recurrent Duration: indefinite Supervising physician: Blanch MediaElizabeth Willis  Anticoagulation Clinic Visit History: Patient does not report signs/symptoms of bleeding or thromboembolism. She states she has been reducing dietary vitamin k intake due to a subtherapeutic INR in the past.  Anticoagulation Episode Summary    Current INR goal:   2.0-3.0  TTR:   58.9 % (5.3 y)  Next INR check:   06/14/2016  INR from last check:   3.2! (05/31/2016)  Weekly max dose:     Target end date:   Indefinite  INR check location[facility]   [facility]  Preferred lab:     Send INR reminders to:      Indications   DVT (deep venous thrombosis) (HCC) [I82.409]       Comments:   [facility] acute [facility] for DVT. 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       ASSESSMENT Recent Results: The most recent result is correlated with 32.5 mg per week: Lab Results  Component Value Date   INR 3.2 05/31/2016   INR 3.10 05/17/2016   INR 3.20 04/05/2016   Anticoagulation Dosing: INR as of 05/31/2016 and Previous Dosing Information    INR Dt INR Goal Marie ConesWkly Tot Sun Mon Tue Wed Thu Fri Sat   05/31/2016 3.2 2.0-3.0 32.5 mg 5 mg 2.5 mg 5 mg 5 mg 5 mg 5 mg 5 mg    Previous description   Take 1 tablet by mouth daily--EXCEPT on Mondays, take only 1/2 tablet on Mondays.    Anticoagulation Dose Instructions as of 05/31/2016      Total Sun Mon Tue Wed Thu Fri Sat   New Dose 30 mg 5 mg 2.5 mg 5 mg 2.5 mg 5 mg 5 mg 5 mg     (5 mg x 1)  (5 mg x 0.5)  (5 mg x 1)  (5 mg x 0.5)  (5 mg x 1)  (5 mg x 1)  (5 mg x 1)                         Description   Dietary changes     INR today: Supratherapeutic  PLAN Weekly  dose was decreased by 8% to 30 mg per week  Patient Instructions  Patient educated about medication as defined in this encounter and verbalized understanding by repeating back instructions provided.   Patient advised to contact clinic or seek medical attention if signs/symptoms of bleeding or thromboembolism occur.  Patient verbalized understanding by repeating back information and was advised to contact me if further medication-related questions arise. Patient was also provided an information handout.  Follow-up Return in about 2 weeks (around 06/14/2016) for Follow up INR 06/14/2016 at 3:15pm.  Marzetta BoardJennifer Kim

## 2016-05-31 NOTE — Patient Instructions (Signed)
Patient educated about medication as defined in this encounter and verbalized understanding by repeating back instructions provided.   

## 2016-06-14 ENCOUNTER — Ambulatory Visit: Payer: Medicaid Other

## 2016-06-30 ENCOUNTER — Other Ambulatory Visit: Payer: Self-pay | Admitting: Internal Medicine

## 2016-07-02 ENCOUNTER — Other Ambulatory Visit: Payer: Self-pay | Admitting: Internal Medicine

## 2016-07-02 ENCOUNTER — Other Ambulatory Visit: Payer: Self-pay | Admitting: Student in an Organized Health Care Education/Training Program

## 2016-07-04 NOTE — Assessment & Plan Note (Deleted)
Mammogram Pap smear Colonoscopy 

## 2016-07-04 NOTE — Assessment & Plan Note (Deleted)
BP Readings from Last 3 Encounters:  03/08/16 118/76  08/27/15 104/66  04/22/15 106/80   Lab Results  Component Value Date   CREATININE 1.70 (H) 04/05/2016   Lab Results  Component Value Date   K 4.7 04/05/2016    Current medications: lisinopril 20 mg daily  Assessment BP goal: <140/90 BP control: controlled  Plan Medications: continue lisinopril 20 mg daily Other:

## 2016-07-04 NOTE — Assessment & Plan Note (Deleted)
BMP Latest Ref Rng & Units 04/05/2016 04/22/2015 06/27/2014  Glucose 65 - 99 mg/dL 95 409(W123(H) 119(J141(H)  BUN 6 - 24 mg/dL 24 17 17   Creatinine 0.57 - 1.00 mg/dL 4.78(G1.70(H) 9.56(O1.26(H) 1.30(Q1.35(H)  BUN/Creat Ratio 9 - 23 14 13  -  Sodium 134 - 144 mmol/L 138 142 136  Potassium 3.5 - 5.2 mmol/L 4.7 4.1 4.5  Chloride 96 - 106 mmol/L 101 99 103  CO2 18 - 29 mmol/L 19 23 24   Calcium 8.7 - 10.2 mg/dL 9.1 9.3 9.3      -avoid NSAIDS -repeat BMP today -discuss nephrology referral

## 2016-07-04 NOTE — Progress Notes (Deleted)
   CC: ***  HPI:  [name] is a 61 y.o. woman with history of HTN, CKD3, and DVT (multiple unprovoked, on warfarin) who presents for management of hypertension.  Please see A&P for status of the patient's chronic medical conditions.   Past Medical History:  Diagnosis Date  . Anxiety   . Depression   . Hypertension   . Low back pain     Review of Systems: ROS   Physical Exam:  There were no vitals filed for this visit.  Physical Exam  Assessment & Plan:   See Encounters Tab for problem based charting.  Patient {GC/GE:3044014::"discussed with","seen with"} Dr. {NAMES:[id number]::"Butcher","Granfortuna","E. Hoffman","Klima","Mullen","Narendra","Vincent"}

## 2016-07-05 ENCOUNTER — Encounter: Payer: Medicaid Other | Admitting: Internal Medicine

## 2016-07-12 ENCOUNTER — Ambulatory Visit: Payer: Medicaid Other

## 2016-07-13 ENCOUNTER — Telehealth: Payer: Self-pay | Admitting: Internal Medicine

## 2016-07-13 NOTE — Telephone Encounter (Signed)
APT. REMINDER CALL, LMTCB °

## 2016-07-14 ENCOUNTER — Ambulatory Visit: Payer: Medicaid Other

## 2016-07-19 ENCOUNTER — Encounter (INDEPENDENT_AMBULATORY_CARE_PROVIDER_SITE_OTHER): Payer: Self-pay

## 2016-07-19 ENCOUNTER — Ambulatory Visit (INDEPENDENT_AMBULATORY_CARE_PROVIDER_SITE_OTHER): Payer: Medicaid Other

## 2016-07-19 DIAGNOSIS — I82509 Chronic embolism and thrombosis of unspecified deep veins of unspecified lower extremity: Secondary | ICD-10-CM

## 2016-07-19 DIAGNOSIS — Z7901 Long term (current) use of anticoagulants: Secondary | ICD-10-CM

## 2016-07-19 LAB — POCT INR: INR: 2.1

## 2016-07-19 NOTE — Progress Notes (Signed)
Anti-Coagulation Progress Note  Marie CargoSusan Willis is a 61 y.o. female who is currently on an anti-coagulation regimen.    RECENT RESULTS: Recent results are below, the most recent result is correlated with a dose of 30 mg. per week: Lab Results  Component Value Date   INR 2.1 07/19/2016   INR 3.2 05/31/2016   INR 3.10 05/17/2016    ANTI-COAG DOSE:    ANTICOAG SUMMARY: Anticoagulation Episode Summary    Current INR goal:   2.0-3.0  TTR:   59.4 % (5.5 y)  Next INR check:   06/14/2016  INR from last check:   3.2! (05/31/2016)  Most recent INR:    2.1 (07/19/2016)  Weekly max dose:     Target end date:   Indefinite  [facility]   [facility]  Preferred lab:     Send INR reminders to:      Indications   DVT (deep venous thrombosis) (HCC) [I82.409]       Comments:   [facility] after an acute admission for DVT. 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        ANTICOAG TODAY:   PATIENT INSTRUCTIONS: Patient Instructions  Patient instructed to take medications as defined in the Anti-coagulation Track section of this encounter.  Patient instructed to take 5mg  today. Patient instructed to take one tablet (5mg ) everyday. Patient verbalized understanding of these instructions.     FOLLOW-UP Return in about 4 weeks (around 08/16/2016) for follow up INR in 4 weeks at 2:30 pm.  Alfredo BachJoseph Naima Veldhuizen, Cleotis NipperBS, PharmD Clinical Pharmacy Resident [phone] (Pager) 07/19/2016 3:44 PM

## 2016-07-19 NOTE — Patient Instructions (Signed)
Patient instructed to take medications as defined in the Anti-coagulation Track section of this encounter.  Patient instructed to take 5mg  today. Patient instructed to take one tablet (5mg ) everyday. Patient verbalized understanding of these instructions.

## 2016-07-21 ENCOUNTER — Telehealth: Payer: Self-pay | Admitting: Internal Medicine

## 2016-07-21 NOTE — Telephone Encounter (Signed)
APT. REMINDER CALL, LMTCB °

## 2016-07-22 ENCOUNTER — Encounter: Payer: Self-pay | Admitting: Internal Medicine

## 2016-07-22 ENCOUNTER — Ambulatory Visit (INDEPENDENT_AMBULATORY_CARE_PROVIDER_SITE_OTHER): Payer: Medicaid Other | Admitting: Internal Medicine

## 2016-07-22 DIAGNOSIS — M1712 Unilateral primary osteoarthritis, left knee: Secondary | ICD-10-CM

## 2016-07-22 DIAGNOSIS — M25561 Pain in right knee: Secondary | ICD-10-CM | POA: Insufficient documentation

## 2016-07-22 DIAGNOSIS — N183 Chronic kidney disease, stage 3 (moderate): Secondary | ICD-10-CM | POA: Diagnosis not present

## 2016-07-22 DIAGNOSIS — G8929 Other chronic pain: Secondary | ICD-10-CM

## 2016-07-22 MED ORDER — MELOXICAM 7.5 MG PO TABS: 7.5000 mg | ORAL_TABLET | Freq: Every day | ORAL | 0 refills | Status: DC

## 2016-07-22 NOTE — Assessment & Plan Note (Signed)
Having knee pain on right knee for 2 months which is similar to her OA pain of left knee. Pain is fairly well controlled with tylenol, voltaren gel, and meloxicam. Ran out of meloxicam. Does not wish to get steroid injection. No effusion seen on exam.   - continue supportive care with tylenol + voltaren gel and meloxicam. Refilled meloxicam. Cautioned her not to take it daily (asked to take no more than 4 days a week) as she has CKD 3.  - f/up with PCP in April. Will benefit from rechecking BMET at that visit.

## 2016-07-22 NOTE — Progress Notes (Signed)
   CC: R knee pain   HPI:  [name] is a 61 y.o. with pmh as listed below is here for R knee pain.  Pain started 2 months ago, hurts with prolonged standing, no radiation of the pain. Feels similar to her arthritis pain which she has on the left knee. Has been using tylenol and voltaren gel. meloxicam used to help but she ran out of that and requesting more. No falls, no injuries. Noticed mild swelling on the knee. Never had steroid injection but states allergic rxn to steroid in the past, does not remember what it was. Does not want steroid injection. Has remained active, does lot of gardening work.   Past Medical History:  Diagnosis Date  . Anxiety   . Chronic kidney disease, stage 3 06/27/2014  . Chronic pain syndrome 01/02/2014  . Depression   . DVT (deep venous thrombosis) (HCC) 01/12/2011   DVT noted in R peroneal vein 01/11/11, started on lovenox & coumadin-1st episode. Treated for approx 6 mo. Reoccurrence 04/2013, on life-long Coumadin now.   . 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 Qualifier: Diagnosis of  By: Eben BurowGarg MD, Ankit     . Hypertension   . Low back pain     Review of Systems:   Review of Systems  Respiratory: Negative for cough.   Cardiovascular: Negative for chest pain and palpitations.  Gastrointestinal: Negative for heartburn, nausea and vomiting.  Musculoskeletal: Positive for joint pain.  Neurological: Negative for dizziness and headaches.     Physical Exam:  Vitals:   07/22/16 1544  BP: 124/70  Pulse: (!) 54  Temp: 98.7 F (37.1 C)  TempSrc: Oral  SpO2: 97%  Weight: 204 lb 8 oz (92.8 kg)  Height: 5\' 5"  (1.651 m)   Physical Exam  Constitutional: She is oriented to person, place, and time. She appears well-developed and well-nourished.  HENT:  Head: Normocephalic and atraumatic.  Cardiovascular: Normal rate and regular rhythm.  Exam  reveals no gallop and no friction rub.   No murmur heard. Respiratory: Effort normal and breath sounds normal. No respiratory distress. She has no wheezes.  Musculoskeletal:  Mild tenderness over the knee joint. Good joint stability. Good strength and ROM of the knee. No effusion, erythema, or warmth.   Neurological: She is alert and oriented to person, place, and time.    Assessment & Plan:   See Encounters Tab for problem based charting.  Patient discussed with Dr. Oswaldo DoneVincent

## 2016-07-22 NOTE — Patient Instructions (Signed)
Please take meloxicam only on the bad days.   Come back to see Dr. Peggyann Juba'Sullivan on April.

## 2016-07-26 NOTE — Progress Notes (Signed)
Internal Medicine Clinic Attending  Case discussed with Dr. Ahmed at the time of the visit.  We reviewed the resident's history and exam and pertinent patient test results.  I agree with the assessment, diagnosis, and plan of care documented in the resident's note. 

## 2016-08-09 ENCOUNTER — Encounter: Payer: Medicaid Other | Admitting: Internal Medicine

## 2016-08-09 NOTE — Assessment & Plan Note (Deleted)
BMP Latest Ref Rng & Units 04/05/2016 04/22/2015 06/27/2014  Glucose 65 - 99 mg/dL 95 161(W) 960(A)  BUN 6 - 24 mg/dL Creatinine 0.57 - 1.00 mg/dL 5.40(J) 8.11(B) 1.47(W)  BUN/Creat Ratio 9 - -  Sodium 134 - 144 mmol/L 138 142 136  Potassium 3.5 - 5.2 mmol/L 4.7 4.1 4.5  Chloride 96 - 106 mmol/L 101 99 103  CO2 18 - 29 mmol/L Calcium 8.7 - 10.2 mg/dL 9.1 9.3 9.3    NSAIDs  -Repeat BMP today

## 2016-08-09 NOTE — Assessment & Plan Note (Deleted)
Mammogram Pap CRC screening

## 2016-08-09 NOTE — Assessment & Plan Note (Deleted)
BP Readings from Last 3 Encounters:  07/22/16 124/70  03/08/16 118/76  08/27/15 104/66   Lab Results  Component Value Date   CREATININE 1.70 (H) 04/05/2016   Lab Results  Component Value Date   K 4.7 04/05/2016    Current medications: lisinopril 20 mg daily  Assessment BP goal: <140/90 BP control: well controlled  Plan Medications: continue current meds Other:

## 2016-08-09 NOTE — Progress Notes (Deleted)
   CC: ***  HPI:  [name] is a 61 y.o. woman with history of CKD3, HTN, DVTs (on warfarin), chronic pain (cervical and lumbar back s/p lumbar laminectomy) and depression who presents for management of HTN.  Please see A&P for status of the patient's chronic medical conditions.   Past Medical History:  Diagnosis Date  . Anxiety   . Chronic kidney disease, stage 3 06/27/2014  . Chronic pain syndrome 01/02/2014  . Depression   . DVT (deep venous thrombosis) (HCC) 01/12/2011   DVT noted in R peroneal vein 01/11/11, started on lovenox & coumadin-1st episode. Treated for approx 6 mo. Reoccurrence 04/2013, on life-long Coumadin now.   . 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 Qualifier: Diagnosis of  By: Eben Burow MD, Ankit     . Hypertension   . Low back pain     Review of Systems:   ROS   Physical Exam:  There were no vitals filed for this visit.  Physical Exam  Assessment & Plan:   See Encounters Tab for problem based charting.  Patient {GC/GE:3044014::"discussed with","seen with"} Dr. {NAMES:[id number]::"Butcher","Granfortuna","E. Hoffman","Klima","Mullen","Narendra","Vincent"}

## 2016-08-10 ENCOUNTER — Encounter: Payer: Self-pay | Admitting: Internal Medicine

## 2016-08-16 ENCOUNTER — Ambulatory Visit (INDEPENDENT_AMBULATORY_CARE_PROVIDER_SITE_OTHER): Payer: Medicaid Other | Admitting: Pharmacist

## 2016-08-16 DIAGNOSIS — I82402 Acute embolism and thrombosis of unspecified deep veins of left lower extremity: Secondary | ICD-10-CM | POA: Diagnosis not present

## 2016-08-16 DIAGNOSIS — Z7901 Long term (current) use of anticoagulants: Secondary | ICD-10-CM | POA: Diagnosis not present

## 2016-08-16 LAB — POCT INR: INR: 1.4

## 2016-08-16 NOTE — Progress Notes (Signed)
Anti-Coagulation Progress Note  Marie Willis is a 61 y.o. female who is currently on an anti-coagulation regimen.    RECENT RESULTS: Recent results are below, the most recent result is correlated with a dose of 35 mg. per week and documented consumption of more than one-pound of brussel sprouts she states.  Lab Results  Component Value Date   INR 1.4 08/16/2016   INR 2.1 07/19/2016   INR 3.2 05/31/2016    ANTI-COAG DOSE: Anticoagulation Dose Instructions as of 08/16/2016      [name] Tue Wed Thu Fri Sat   New Dose 5 mg 7.5 mg 5 mg 5 mg 7.5 mg 5 mg 5 mg    Description   Dietary changes. Counseled patient to eat dark green-leafy vegetables in moderation--and in a consistent pattern (avoid "binge" eating of these foods--as she has done in past week). Take 1&1/2 tablets of your  peach-colored warfarin tablets by mouth at [facility] on Mondays and Thursdays; all other days--take ONLY 1 tablet at 6PM.       ANTICOAG SUMMARY: Anticoagulation Episode Summary    Current INR goal:   2.0-3.0  TTR:   58.8 % (5.6 y)  Next INR check:   09/06/2016  INR from last check:   1.4! (08/16/2016)  Weekly max dose:     Target end date:   Indefinite  [facility] check [facility]   [facility]  Preferred lab:     Send INR reminders to:      Indications   DVT (deep venous thrombosis) (HCC) [I82.409]       Comments:   Commenced being seen in the [facility] 16-SEP-12 after an acute admission for DVT. 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        ANTICOAG TODAY: Anticoagulation Summary  As of 08/16/2016   INR goal:   2.0-3.0  TTR:     Today's INR:   1.4!  Next INR check:   09/06/2016  Target end date:   Indefinite   Indications   DVT (deep venous thrombosis) (HCC) [I82.409]        Anticoagulation Episode Summary    [facility]   [facility]   Preferred lab:      Send INR  reminders to:      Comments:   Commenced being seen in the [facility] 16-SEP-12 after an acute admission for DVT. 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      PATIENT INSTRUCTIONS: Patient Instructions  Patient instructed to take medications as defined in the Anti-coagulation Track section of this encounter.  Patient instructed to take today's dose.  Patient was instructed regarding dietary intake of green-leafy vegetables--Dietary changes. Counseled patient to eat dark green-leafy vegetables in moderation--and in a consistent pattern (avoid "binge" eating of these foods--as she has done in past week). Take 1&1/2 tablets of your  peach-colored warfarin tablets by mouth at [facility] on Mondays and Thursdays; all other days--take ONLY 1 tablet at [facility].  Patient verbalized understanding of these instructions.       FOLLOW-UP Return in about 3 weeks (around 09/06/2016) for Follow up INR at 2:30PM.  Hulen Luster, [name] Pharm.D., CACP

## 2016-08-16 NOTE — Patient Instructions (Signed)
Patient instructed to take medications as defined in the Anti-coagulation Track section of this encounter.  Patient instructed to take today's dose.  Patient was instructed regarding dietary intake of green-leafy vegetables--Dietary changes. Counseled patient to eat dark green-leafy vegetables in moderation--and in a consistent pattern (avoid "binge" eating of these foods--as she has done in past week). Take 1&1/2 tablets of your  peach-colored warfarin tablets by mouth at [facility] on Mondays and Thursdays; all other days--take ONLY 1 tablet at [facility].  Patient verbalized understanding of these instructions.

## 2016-08-17 ENCOUNTER — Telehealth: Payer: Self-pay | Admitting: *Deleted

## 2016-08-17 NOTE — Telephone Encounter (Signed)
Pt calls and states could she use one of the OTC elastic leg supports- it's black, long to cover more leg and has a hole in the knee area, she would like to use this on her leg that she has some dvt troubles, plesase  Advise?

## 2016-08-23 NOTE — Telephone Encounter (Signed)
Is this for knee pain or swelling?  If swelling, any new changes?  She can use the knee support if she'd like, but should be evaluated if she has a new/changing problem.

## 2016-08-30 NOTE — Addendum Note (Signed)
Addended by: Remus BlakeBARROW, Cleston Lautner K on: 08/30/2016 01:53 PM   Modules accepted: Orders

## 2016-08-31 NOTE — Telephone Encounter (Signed)
Pain, will call her and inform her of this

## 2016-09-03 ENCOUNTER — Telehealth: Payer: Self-pay

## 2016-09-03 NOTE — Telephone Encounter (Signed)
Left MSG on vm. 

## 2016-09-06 ENCOUNTER — Encounter: Payer: Medicaid Other | Admitting: Internal Medicine

## 2016-09-06 ENCOUNTER — Ambulatory Visit (INDEPENDENT_AMBULATORY_CARE_PROVIDER_SITE_OTHER): Payer: Medicaid Other | Admitting: Pharmacist

## 2016-09-06 DIAGNOSIS — Z7901 Long term (current) use of anticoagulants: Secondary | ICD-10-CM | POA: Diagnosis not present

## 2016-09-06 DIAGNOSIS — I82401 Acute embolism and thrombosis of unspecified deep veins of right lower extremity: Secondary | ICD-10-CM

## 2016-09-06 LAB — POCT INR: INR: 2

## 2016-09-06 NOTE — Assessment & Plan Note (Deleted)
NSAIDs

## 2016-09-06 NOTE — Progress Notes (Deleted)
   CC: ***  HPI:  Ms.Dimonique Su Willis is a 61 y.o. woman with history of CKD3, HTN, DVTs (on warfarin), chronic pain (cervical and lumbar back s/p lumbar laminectomy) and depression who presents for management of HTN.  Please see A&P for status of the patient's chronic medical conditions.   Past Medical History:  Diagnosis Date  . Anxiety   . Chronic kidney disease, stage 3 06/27/2014  . Chronic pain syndrome 01/02/2014  . Depression   . DVT (deep venous thrombosis) (HCC) 01/12/2011   DVT noted in R peroneal vein 01/11/11, started on lovenox & coumadin-1st episode. Treated for approx 6 mo. Reoccurrence 04/2013, on life-long Coumadin now.   . 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 Qualifier: Diagnosis of  By: Eben BurowGarg MD, Ankit     . Hypertension   . Low back pain     Review of Systems:   ROS   Physical Exam:  There were no vitals filed for this visit.  Physical Exam  Assessment & Plan:   See Encounters Tab for problem based charting.  Patient {GC/GE:3044014::"discussed with","seen with"} Dr. {NAMES:[id number]::"Butcher","Granfortuna","E. Hoffman","Klima","Mullen","Narendra","Vincent"}

## 2016-09-06 NOTE — Progress Notes (Signed)
Anti-Coagulation Progress Note  Marie CargoSusan Shuffield is a 61 y.o. female who is currently on an anti-coagulation regimen for recurrence of lower extremity VTE [facility]. Patient is currently on sustained anticoagulation given her history of multiple repeat occurrences. As per CHEST guidelines--annual reassessment of risks vs. Benefits of prolonged anticoagulation should be conducted with the advice and the consent of the patient.     RECENT RESULTS: Recent results are below, the most recent result is correlated with a dose of 40 mg. per week: Lab Results  Component Value Date   INR 2.00 09/06/2016   INR 1.4 08/16/2016   INR 2.1 07/19/2016    ANTI-COAG DOSE: Anticoagulation Warfarin Dose Instructions as of 09/06/2016      Glynis SmilesSun Mon Tue Wed Thu Fri Sat   New Dose 5 mg 7.5 mg 5 mg 5 mg 7.5 mg 5 mg 5 mg    Description   Take 1 tablet of your 5mg  peach-colored warfarin tablets on Mondays, Wednesdays and Fridays; all other days--take 1 and 1/2 tablets of your 5mg  peach-colored warfarin tablets.       ANTICOAG SUMMARY: Anticoagulation Episode Summary    Current INR goal:   2.0-3.0  TTR:   58.2 % (5.6 y)  Next INR check:   09/27/2016  INR from last check:   2.00 (09/06/2016)  Weekly max warfarin dose:     Target end date:   Indefinite  INR check location[facility]   [facility]  Preferred lab:     Send INR reminders to:      Indications   DVT (deep venous thrombosis) (HCC) [I82.409]       Comments:   [facility] after an acute admission for DVT. 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        ANTICOAG TODAY: Anticoagulation Summary  As of 09/06/2016   INR goal:   2.0-3.0  TTR:     Today's INR:   2.00  Next INR check:   09/27/2016  Target end date:   Indefinite   Indications   DVT (deep venous thrombosis) (HCC)  [I82.409]        Anticoagulation Episode Summary    INR check location[facility]   [facility]   Preferred lab:      Send INR reminders to:      Comments:   [facility] for DVT. 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      PATIENT INSTRUCTIONS: Patient Instructions  Patient instructed to take medications as defined in the Anti-coagulation Track section of this encounter.  Patient instructed to take today's dose.  Patient instructed to take 1 tablet only on Mondays, Wednesdays, and Fridays; all other days--take 1 and 1/2 tablets of your 5mg  peach-colored warfarin tablets by mouth at [facility].  Patient verbalized understanding of these instructions.       FOLLOW-UP Return in 3 weeks (on 09/27/2016) for Follow up INR at 2:45PM.  Hulen LusterJames Victoriana Aziz, [name] Pharm.D., CACP

## 2016-09-06 NOTE — Patient Instructions (Signed)
Patient instructed to take medications as defined in the Anti-coagulation Track section of this encounter.  Patient instructed to take today's dose.  Patient instructed to take 1 tablet only on Mondays, Wednesdays, and Fridays; all other days--take 1 and 1/2 tablets of your 5mg  peach-colored warfarin tablets by mouth at [facility].  Patient verbalized understanding of these instructions.

## 2016-09-06 NOTE — Assessment & Plan Note (Deleted)
BP Readings from Last 3 Encounters:  07/22/16 124/70  03/08/16 118/76  08/27/15 104/66   Lab Results  Component Value Date   CREATININE 1.70 (H) 04/05/2016   Lab Results  Component Value Date   K 4.7 04/05/2016    Current medications: lisinopril 20 mg daily  Assessment BP goal: <140/90 BP control: well controlled  Plan Medications: continue current meds Other:

## 2016-09-06 NOTE — Assessment & Plan Note (Deleted)
Mammogram Pap CRC screening

## 2016-09-06 NOTE — Assessment & Plan Note (Deleted)
BMP Latest Ref Rng & Units 04/05/2016 04/22/2015 06/27/2014  Glucose 65 - 99 mg/dL 95 161(W123(H) 960(A141(H)  BUN 6 - 24 mg/dL 24 17 17   Creatinine 0.57 - 1.00 mg/dL 5.40(J1.70(H) 8.11(B1.26(H) 1.47(W1.35(H)  BUN/Creat Ratio 9 - 23 14 13  -  Sodium 134 - 144 mmol/L 138 142 136  Potassium 3.5 - 5.2 mmol/L 4.7 4.1 4.5  Chloride 96 - 106 mmol/L 101 99 103  CO2 18 - 29 mmol/L 19 23 24   Calcium 8.7 - 10.2 mg/dL 9.1 9.3 9.3    NSAID use  -Repeat BMP today

## 2016-09-07 NOTE — Progress Notes (Signed)
INTERNAL MEDICINE TEACHING ATTENDING ADDENDUM - Laniqua Torrens M.D  Duration- indefinite, Indication- recurrent DVT, INR- therapeutic. Agree with pharmacy recommendations as outlined in their note.     

## 2016-09-22 ENCOUNTER — Other Ambulatory Visit: Payer: Self-pay | Admitting: Internal Medicine

## 2016-09-22 NOTE — Telephone Encounter (Signed)
Meloxicam no longer on medication list-will send refill request to pcp for consideration.Marie AlvineGoldston, Marie Tiu Cassady5/30/201811:01 AM

## 2016-09-27 ENCOUNTER — Ambulatory Visit (INDEPENDENT_AMBULATORY_CARE_PROVIDER_SITE_OTHER): Payer: Medicaid Other | Admitting: Pharmacist

## 2016-09-27 DIAGNOSIS — I82401 Acute embolism and thrombosis of unspecified deep veins of right lower extremity: Secondary | ICD-10-CM

## 2016-09-27 DIAGNOSIS — I82509 Chronic embolism and thrombosis of unspecified deep veins of unspecified lower extremity: Secondary | ICD-10-CM

## 2016-09-27 DIAGNOSIS — Z7901 Long term (current) use of anticoagulants: Secondary | ICD-10-CM

## 2016-09-27 LAB — POCT INR: INR: 3

## 2016-09-27 NOTE — Patient Instructions (Signed)
Patient instructed to take medications as defined in the Anti-coagulation Track section of this encounter.  Patient instructed to OMIT today's dose.  Patient instructed to re-commence warfarin on Tuesday, September 28, 2016 and take 1 tablet of your 5mg  peach-colored warfarin tablets daily--except on Saturdays and Sundays--take 1 & 1/2 tablets on Saturdays and Sundays.  Patient verbalized understanding of these instructions.

## 2016-09-27 NOTE — Progress Notes (Signed)
Anti-Coagulation Progress Note  Marie CargoSusan Willis is a 61 y.o. female who is currently on an anti-coagulation regimen.    RECENT RESULTS: Recent results are below, the most recent result is correlated with a dose of 45 mg. per week for history of DVT with recurrences and a second unprovoked DVT suggesting continued indefinite oral anticoagulation with warfarin. Annual re-assessment of continued benefits versus risk in a shared decision with patient and PCP should be conducted annually.  Lab Results  Component Value Date   INR 3.0 09/27/2016   INR 2.00 09/06/2016   INR 1.4 08/16/2016    ANTI-COAG DOSE: Anticoagulation Warfarin Dose Instructions as of 09/27/2016      Glynis SmilesSun Mon Tue Wed Thu Fri Sat   New Dose 7.5 mg 5 mg 5 mg 5 mg 5 mg 5 mg 7.5 mg    Description   Take 1 tablet of your 5mg  peach-colored warfarin tablets on Mondays-Fridays; On Saturdays and Sundays, take 1 and 1/2 tablets of your 5mg  peach-colored warfarin tablets. OMIT today's dose (Monday June 4th, 2018)--just this one time.       ANTICOAG SUMMARY: Anticoagulation Episode Summary    Current INR goal:   2.0-3.0  TTR:   58.6 % (5.7 y)  Next INR check:   10/11/2016  INR from last check:   3.0 (09/27/2016)  Weekly max warfarin dose:     Target end date:   Indefinite  [facility]   [facility]  Preferred lab:     Send INR reminders to:      Indications   DVT (deep venous thrombosis) (HCC) [I82.409]       Comments:   [facility] an acute [facility] for DVT. 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        ANTICOAG TODAY: Anticoagulation Summary  As of 09/27/2016   INR goal:   2.0-3.0  TTR:     Today's INR:   3.0  Next INR check:   10/11/2016  Target end date:   Indefinite   Indications   DVT (deep venous thrombosis) (HCC) [I82.409]         Anticoagulation Episode Summary    INR check [facility]   [facility]   Preferred lab:      Send INR reminders to:      Comments:   [facility] acute [facility] for DVT. 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      PATIENT INSTRUCTIONS: Patient Instructions  Patient instructed to take medications as defined in the Anti-coagulation Track section of this encounter.  Patient instructed to OMIT today's dose.  Patient instructed to re-commence warfarin on Tuesday, September 28, 2016 and take 1 tablet of your 5mg  peach-colored warfarin tablets daily--except on Saturdays and Sundays--take 1 & 1/2 tablets on Saturdays and Sundays.  Patient verbalized understanding of these instructions.       FOLLOW-UP Return in 2 weeks (on 10/11/2016) for Follow up INR at 2:30PM.  Hulen LusterJames Groce, [name] Pharm.D., CACP

## 2016-09-28 ENCOUNTER — Other Ambulatory Visit: Payer: Self-pay | Admitting: Internal Medicine

## 2016-09-28 DIAGNOSIS — G8929 Other chronic pain: Secondary | ICD-10-CM

## 2016-09-28 DIAGNOSIS — M25561 Pain in right knee: Principal | ICD-10-CM

## 2016-09-28 NOTE — Progress Notes (Signed)
INTERNAL MEDICINE TEACHING ATTENDING ADDENDUM - Dionne Rossa M.D  Duration- indefinite, Indication- recurrent DVT, INR- therapeutic. Agree with pharmacy recommendations as outlined in their note.     

## 2016-10-01 ENCOUNTER — Encounter: Payer: Self-pay | Admitting: *Deleted

## 2016-10-04 ENCOUNTER — Encounter: Payer: Medicaid Other | Admitting: Internal Medicine

## 2016-10-04 NOTE — Assessment & Plan Note (Deleted)
BP Readings from Last 3 Encounters:  07/22/16 124/70  03/08/16 118/76  08/27/15 104/66   Lab Results  Component Value Date   CREATININE 1.70 (H) 04/05/2016   Lab Results  Component Value Date   K 4.7 04/05/2016    Current medications: lisinopril 20 mg daily  Assessment BP goal: <140/90 BP control: well controlled  Plan Medications: continue current meds Other:

## 2016-10-04 NOTE — Assessment & Plan Note (Deleted)
BMP Latest Ref Rng & Units 04/05/2016 04/22/2015 06/27/2014  Glucose 65 - 99 mg/dL 95 161(W123(H) 960(A141(H)  BUN 6 - 24 mg/dL 24 17 17   Creatinine 0.57 - 1.00 mg/dL 5.40(J1.70(H) 8.11(B1.26(H) 1.47(W1.35(H)  BUN/Creat Ratio 9 - 23 14 13  -  Sodium 134 - 144 mmol/L 138 142 136  Potassium 3.5 - 5.2 mmol/L 4.7 4.1 4.5  Chloride 96 - 106 mmol/L 101 99 103  CO2 18 - 29 mmol/L 19 23 24   Calcium 8.7 - 10.2 mg/dL 9.1 9.3 9.3    NSAID use  -Repeat BMP today

## 2016-10-04 NOTE — Progress Notes (Deleted)
   CC: ***  HPI:  [name] is a 61 y.o. woman with history of CKD3, HTN, DVTs (on warfarin), chronic pain (cervical and lumbar back s/p lumbar laminectomy) and depression who presents for management of HTN.  Please see A&P for status of the patient's chronic medical conditions.   Past Medical History:  Diagnosis Date  . Anxiety   . Chronic kidney disease, stage 3 06/27/2014  . Chronic pain syndrome 01/02/2014  . Depression   . DVT (deep venous thrombosis) (HCC) 01/12/2011   DVT noted in R peroneal vein 01/11/11, started on lovenox & coumadin-1st episode. Treated for approx 6 mo. Reoccurrence 04/2013, on life-long Coumadin now.   . 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 Qualifier: Diagnosis of  By: Eben BurowGarg MD, Ankit     . Hypertension   . Low back pain     Review of Systems:   ROS   Physical Exam:  There were no vitals filed for this visit.  Physical Exam  Assessment & Plan:   See Encounters Tab for problem based charting.  Patient {GC/GE:3044014::"discussed with","seen with"} Dr. {NAMES:3044014::"Butcher","Granfortuna","E. Hoffman","Klima","Mullen","Narendra","Vincent"}

## 2016-10-11 ENCOUNTER — Ambulatory Visit: Payer: Medicaid Other

## 2016-10-18 ENCOUNTER — Other Ambulatory Visit: Payer: Self-pay | Admitting: Pharmacist

## 2016-10-18 ENCOUNTER — Telehealth: Payer: Self-pay | Admitting: *Deleted

## 2016-10-18 ENCOUNTER — Ambulatory Visit (INDEPENDENT_AMBULATORY_CARE_PROVIDER_SITE_OTHER): Payer: Medicaid Other | Admitting: Pharmacist

## 2016-10-18 DIAGNOSIS — I82402 Acute embolism and thrombosis of unspecified deep veins of left lower extremity: Secondary | ICD-10-CM

## 2016-10-18 DIAGNOSIS — I82502 Chronic embolism and thrombosis of unspecified deep veins of left lower extremity: Secondary | ICD-10-CM | POA: Diagnosis present

## 2016-10-18 DIAGNOSIS — I824Y1 Acute embolism and thrombosis of unspecified deep veins of right proximal lower extremity: Secondary | ICD-10-CM

## 2016-10-18 DIAGNOSIS — Z7901 Long term (current) use of anticoagulants: Secondary | ICD-10-CM

## 2016-10-18 LAB — POCT INR: INR: 3.6

## 2016-10-18 MED ORDER — WARFARIN SODIUM 5 MG PO TABS: ORAL_TABLET | ORAL | 2 refills | Status: DC

## 2016-10-18 NOTE — Telephone Encounter (Signed)
Spoke with patient's husband-[facility] [address]  [facility] contacted-they will contact [facility] to have rxs transferred.Kingsley SpittleGoldston, Darlene Cassady6/25/20184:08 PM

## 2016-10-18 NOTE — Progress Notes (Signed)
Received a fax from [facility] to clarify whether warfarin TARO brand is ok to dispense. Re-sent prescription confirming ok.

## 2016-10-18 NOTE — Patient Instructions (Signed)
Patient instructed to take medications as defined in the Anti-coagulation Track section of this encounter.  Patient instructed to OMIT today's dose.  Patient instructed to take 1 tablet of your 5mg  peach-colored warfarin tablets Tuesday through Sunday (OMIT today's dose). Return to clinic for repeat INR in 1 week (2-JUL-18 at 2:45PM). Patient verbalized understanding of these instructions.

## 2016-10-18 NOTE — Progress Notes (Signed)
Anticoagulation Management Marie Willis is a 61 y.o. female who reports to the clinic for monitoring of warfarin treatment.    Indication: DVT, chronic with multiple recurrences Duration: indefinite Supervising physician: Debe Coder  Anticoagulation Clinic Visit History: Patient does not report signs/symptoms of bleeding or thromboembolism  Other recent changes: YES. She is on doxycyline 100mg  PO QD for 7 days by outside provider (ophtalmologist she states, for a stye on her LEFT EYE.) Anticoagulation Episode Summary    Current INR goal:   2.0-3.0  TTR:   58.1 % (5.7 y)  Next INR check:   10/25/2016  INR from last check:   3.60! (10/18/2016)  Weekly max warfarin dose:     Target end date:   Indefinite  IN[facility] check location:   [facility]  Preferred lab:     Send INR reminders to:      Indications   DVT (deep venous thrombosis) (HCC) [I82.409]       Comments:   Commenced being seen in the [facility] 16-SEP-12 after an acute admission for DVT. 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       ASSESSMENT Recent Results: The most recent result is correlated with 40 mg per week: Lab Results  Component Value Date   INR 3.60 10/18/2016   INR 3.0 09/27/2016   INR 2.00 09/06/2016    Anticoagulation Dosing: INR as of 10/18/2016 and Previous Warfarin Dosing Information    INR Dt INR Goal Wkly Tot Sun Mon Tue Wed Thu Fri Sat   10/18/2016 3.60 2.0-3.0 40 mg 7.5 mg 5 mg 5 mg 5 mg 5 mg 5 mg 7.5 mg    Previous description   Take 1 tablet of your 5mg  peach-colored warfarin tablets on Mondays-Fridays; On Saturdays and Sundays, take 1 and 1/2 tablets of your 5mg  peach-colored warfarin tablets. OMIT today's dose (Monday June 4th, 2018)--just this one time.    Anticoagulation Warfarin Dose Instructions as of 10/18/2016      Total Sun Mon Tue Wed Thu Fri Sat   New Dose 30 mg 5 mg  0 mg 5 mg 5 mg 5 mg 5 mg 5 mg     (5 mg x 1)  -  (5 mg x 1)  (5 mg x 1)  (5 mg x 1)  (5 mg x 1)  (5 mg x 1)                         Description   Take 1 tablet of your 5mg  peach-colored warfarin tablets Tuesday through Sunday (OMIT today's dose). Return to clinic for repeat INR in 1 week (2-JUL-18 at 2:45PM).     INR today: Supratherapeutic  PLAN Weekly dose was decreased by 20% to 30 mg per week after 1 omitted dose today.   Patient Instructions  Patient instructed to take medications as defined in the Anti-coagulation Track section of this encounter.  Patient instructed to OMIT today's dose.  Patient instructed to take 1 tablet of your 5mg  peach-colored warfarin tablets Tuesday through Sunday (OMIT today's dose). Return to clinic for repeat INR in 1 week (2-JUL-18 at 2:45PM). Patient verbalized understanding of these instructions.     Patient advised to contact clinic or seek medical attention if signs/symptoms of bleeding or thromboembolism occur.  Patient verbalized understanding by repeating back information and was advised to contact me if further medication-related questions arise.  Patient was also provided an information handout.  Follow-up Return in 7 days (on 10/25/2016) for Follow up INR at 2:45PM.  Janie MorningGroce [name], Ricke HeyJames Boyd PharmD, CACP, CPP  15 minutes spent face-to-face with the patient during the encounter. 50% of time spent on education. 50% of time was spent on finger-stick, point of care INR sample collection, processing, interpretation, data-entry into EPIC/CHL and [email or web address].

## 2016-10-19 NOTE — Progress Notes (Signed)
I reviewed Dr. Saralyn PilarGroce's note.  Patient is on coumadin for recurrent VTE.  INR high, in the setting of Abx use.  Plan per Dr. Alexandria LodgeGroce.

## 2016-10-25 ENCOUNTER — Ambulatory Visit (INDEPENDENT_AMBULATORY_CARE_PROVIDER_SITE_OTHER): Payer: Medicaid Other | Admitting: Pharmacist

## 2016-10-25 DIAGNOSIS — I82409 Acute embolism and thrombosis of unspecified deep veins of unspecified lower extremity: Secondary | ICD-10-CM | POA: Diagnosis not present

## 2016-10-25 DIAGNOSIS — Z7901 Long term (current) use of anticoagulants: Secondary | ICD-10-CM | POA: Diagnosis not present

## 2016-10-25 LAB — POCT INR: INR: 5.4

## 2016-10-25 NOTE — Progress Notes (Signed)
INTERNAL MEDICINE TEACHING ATTENDING ADDENDUM ° °I agree with pharmacy recommendations as outlined in their note.  ° °-Duncan Vincent MD ° °

## 2016-10-25 NOTE — Patient Instructions (Signed)
Patient instructed to take medications as defined in the Anti-coagulation Track section of this encounter.  Patient instructed to OMIT doses on Monday 2-JUL-18 and Tuesday 3-JUL-18. Recommence warfarin on Wednesday, 4-JUL-18 by taking one (1) of your five milligram (5mg ) peach colored warfarin tablets by mouth, once-daily at [facility]6PM as instructed. Patient verbalized understanding of these instructions.

## 2016-10-25 NOTE — Progress Notes (Signed)
Anticoagulation Management Marie Willis is a 61 y.o. female who reports to the clinic for monitoring of warfarin treatment.    Indication: DVT, history of; multiple recurrences--second unprovoked event. Duration: indefinite Supervising physician: Erlinda Hong  Anticoagulation Clinic Visit History: Patient does not report signs/symptoms of bleeding or thromboembolism  Other recent changes: No diet, medications, lifestyle changes endorsed other than as noted. States took an EXTRA dose this past Friday 29-JUN-18; recently had a febrile illness (hypermetabolic state--Vitamin K dependent clotting factors are CLEARED more readily [dose reduction often necessary])+ has been on doxycycline (finished Thursday 28-JUN-18). States she took 1&1/2 tablets on Saturday and Sunday--was supposed to take ONLY 1 tablet those days.  Anticoagulation Episode Summary    Current INR goal:   2.0-3.0  TTR:   57.9 % (5.7 y)  Next INR check:   11/01/2016  INR from last check:   5.4! (10/25/2016)  Weekly max warfarin dose:     Target end date:   Indefinite  [facility] check [facility]   [facility]  Preferred lab:     Send INR reminders to:      Indications   DVT (deep venous thrombosis) (HCC) [I82.409]       Comments:   Commenced being seen in the [facility] 16-SEP-12 after an acute admission for DVT. 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       ASSESSMENT Recent Results: The most recent result is correlated with 40 mg per week--was supposed to have been taking 30mg  per week. Took an extra-dose as well as a dose that was too high based upon instructions documented as last provided--accounting for today's INR I suspect.  Lab Results  Component Value Date   INR 5.4 10/25/2016   INR 3.60 10/18/2016   INR 3.0 09/27/2016    Anticoagulation Dosing: INR as of 10/25/2016 and Previous Warfarin Dosing  Information    INR Dt INR Goal Jacki Cones Sun Mon Tue Wed Thu Fri Sat   10/25/2016 5.4 2.0-3.0 40 mg 5 mg 0 mg 5 mg 5 mg 10 mg 7.5 mg 7.5 mg   Patient deviated from recommended dosing.       Previous description   Take 1 tablet of your 5mg  peach-colored warfarin tablets Tuesday through Sunday (OMIT today's dose). Return to clinic for repeat INR in 1 week (2-JUL-18 at 2:45PM).   Anticoagulation Warfarin Dose Instructions as of 10/25/2016      Total Sun Mon Tue Wed Thu Fri Sat   New Dose 25 mg 5 mg Hold Hold 5 mg 5 mg 5 mg 5 mg     (5 mg x 1)  -  -  (5 mg x 1)  (5 mg x 1)  (5 mg x 1)  (5 mg x 1)                         Description   OMIT doses on Monday 2-JUL-18 and Tuesday 3-JUL-18. Recommence warfarin on Wednesday, 4-JUL-18 by taking one (1) of your five milligram (5mg ) peach colored warfarin tablets by mouth, once-daily at [facility] as instructed.     INR today: Supratherapeutic  PLAN Weekly dose was decreased by 20% to 30 mg per week--with 2 days of dose omission planned for today and tomorrow.   Patient Instructions  Patient instructed to take medications as defined in the Anti-coagulation Track section of this encounter.  Patient instructed  to OMIT doses on Monday 2-JUL-18 and Tuesday 3-JUL-18. Recommence warfarin on Wednesday, 4-JUL-18 by taking one (1) of your five milligram (5mg ) peach colored warfarin tablets by mouth, once-daily at [facility]6PM as instructed. Patient verbalized understanding of these instructions.     Patient advised to contact clinic or seek medical attention if signs/symptoms of bleeding or thromboembolism occur.  Patient verbalized understanding by repeating back information and was advised to contact me if further medication-related questions arise. Patient was also provided an information handout.  Follow-up Return in 7 days (on 11/01/2016) for Follow up INR at 2PM.  Janie MorningGroce [name], Ricke HeyJames Boyd PharmD, CACP, CPP  15 minutes spent face-to-face with the patient during  the encounter. 50% of time spent on education. 50% of time was spent on finger-stick point of care INR sample collection, processing, interpretation, data entry in to EPIC/CHL as well as [email or web address].

## 2016-11-01 ENCOUNTER — Ambulatory Visit (INDEPENDENT_AMBULATORY_CARE_PROVIDER_SITE_OTHER): Payer: Medicaid Other | Admitting: Pharmacist

## 2016-11-01 DIAGNOSIS — I82502 Chronic embolism and thrombosis of unspecified deep veins of left lower extremity: Secondary | ICD-10-CM

## 2016-11-01 DIAGNOSIS — Z7901 Long term (current) use of anticoagulants: Secondary | ICD-10-CM

## 2016-11-01 DIAGNOSIS — I82402 Acute embolism and thrombosis of unspecified deep veins of left lower extremity: Secondary | ICD-10-CM

## 2016-11-01 LAB — POCT INR: INR: 3.4

## 2016-11-01 NOTE — Progress Notes (Signed)
Anticoagulation Management Marie Willis is a 61 y.o. female who reports to the clinic for monitoring of warfarin treatment.    Indication: DVT , history of; multiple recurrences Duration: indefinite Supervising physician: Earl LagosNischal Narendra  Anticoagulation Clinic Visit History: Patient does not report signs/symptoms of bleeding or thromboembolism  Other recent changes: No diet, medications, lifestyle changes endorsed by the patient.  Anticoagulation Episode Summary    Current INR goal:   2.0-3.0  TTR:   57.7 % (5.8 y)  Next INR check:   11/29/2016  INR from last check:   3.40! (11/01/2016)  Weekly max warfarin dose:     Target end date:   Indefinite  INR check location[facility]   [facility]  Preferred lab:     Send INR reminders to:      Indications   DVT (deep venous thrombosis) (HCC) [I82.409]       Comments:   [facility] for DVT. 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       ASSESSMENT Recent Results: The most recent result is correlated with 30 mg per week: Lab Results  Component Value Date   INR 3.40 11/01/2016   INR 5.4 10/25/2016   INR 3.60 10/18/2016    Anticoagulation Dosing: INR as of 11/01/2016 and Previous Warfarin Dosing Information    INR Dt [facility]al [facility] Sun Mon Tue Wed Thu Fri Sat   11/01/2016 3.40 2.0-3.0 25 mg 5 mg 0 mg 0 mg 5 mg 5 mg 5 mg 5 mg    Previous description   OMIT doses on Monday 2-JUL-18 and Tuesday 3-JUL-18. Recommence warfarin on Wednesday, 4-JUL-18 by taking one (1) of your five milligram (5mg ) peach colored warfarin tablets by mouth, once-daily at [facility]6PM as instructed.   Anticoagulation Warfarin Dose Instructions as of 11/01/2016      Total Sun Mon Tue Wed Thu Fri Sat   New Dose 25 mg 5 mg 0 mg 0 mg 5 mg 5 mg 5 mg 5 mg     (5 mg x 1)  -  -  (5 mg x 1)  (5 mg x 1)  (5 mg x 1)  (5 mg x 1)                         Description   Take 1/2 tablet of your 5mg  peach-colored warfarin tablets by mouth, once-daily at 6PM on MONDAYS, WEDNESDAYS and FRIDAYS; all other days--take ONE (1) tablet by mouth as instructed.      INR today: Supratherapeutic  PLAN Weekly dose was decreased by 9% to 27.5 mg per week  There are no Patient Instructions on file for this visit. Patient advised to contact clinic or seek medical attention if signs/symptoms of bleeding or thromboembolism occur.  Patient verbalized understanding by repeating back information and was advised to contact me if further medication-related questions arise. Patient was also provided an information handout.  Follow-up Return in 4 weeks (on 11/29/2016) for Follow up INR at 2PM.  Janie MorningGroce [name], Ricke HeyJames Boyd PharmD, CACP, CPP  15 minutes spent face-to-face with the patient during the encounter. 50% of time spent on education. 50% of time was spent on finger-stick, point of care INR sample collection, processing, interpretation and data entry into EPIC/CHL and [email or web address].

## 2016-11-01 NOTE — Patient Instructions (Signed)
Patient instructed to take medications as defined in the Anti-coagulation Track section of this encounter.  Patient instructed to take today's dose.  Patient instructed to take 1/2 tablet of your 5mg  peach-colored warfarin tablets by mouth, once-daily at 6PM on MONDAYS, WEDNESDAYS and FRIDAYS; all other days--take ONE (1) tablet by mouth as instructed. Patient verbalized understanding of these instructions.

## 2016-11-02 NOTE — Progress Notes (Signed)
INTERNAL MEDICINE TEACHING ATTENDING ADDENDUM - Elverta Dimiceli M.D  Duration- indefinite, Indication- recurrent DVT, INR- supratherapeutic. Agree with pharmacy recommendations as outlined in their note.  

## 2016-11-18 ENCOUNTER — Other Ambulatory Visit: Payer: Self-pay | Admitting: *Deleted

## 2016-11-18 DIAGNOSIS — G8929 Other chronic pain: Secondary | ICD-10-CM

## 2016-11-18 DIAGNOSIS — M25561 Pain in right knee: Principal | ICD-10-CM

## 2016-11-18 NOTE — Telephone Encounter (Signed)
She needs blood work (CKD) before another refill of this medication.  Per chart review, she was due to come back in April.  Please make a PCP appointment for her or [facility].  Thanks.  Refusing refills.

## 2016-11-28 ENCOUNTER — Observation Stay (HOSPITAL_COMMUNITY)
Admission: EM | Admit: 2016-11-28 | Discharge: 2016-11-29 | Disposition: A | Discharge status: Home / Self Care | Payer: Medicaid Other | Attending: Internal Medicine | Admitting: Internal Medicine

## 2016-11-28 ENCOUNTER — Emergency Department (HOSPITAL_COMMUNITY): Payer: Medicaid Other

## 2016-11-28 ENCOUNTER — Encounter (HOSPITAL_COMMUNITY): Payer: Self-pay | Admitting: Emergency Medicine

## 2016-11-28 DIAGNOSIS — F418 Other specified anxiety disorders: Secondary | ICD-10-CM | POA: Diagnosis not present

## 2016-11-28 DIAGNOSIS — Z86718 Personal history of other venous thrombosis and embolism: Secondary | ICD-10-CM | POA: Diagnosis not present

## 2016-11-28 DIAGNOSIS — R55 Syncope and collapse: Secondary | ICD-10-CM | POA: Diagnosis not present

## 2016-11-28 DIAGNOSIS — Z7901 Long term (current) use of anticoagulants: Secondary | ICD-10-CM | POA: Insufficient documentation

## 2016-11-28 DIAGNOSIS — Z79899 Other long term (current) drug therapy: Secondary | ICD-10-CM | POA: Diagnosis not present

## 2016-11-28 DIAGNOSIS — M5126 Other intervertebral disc displacement, lumbar region: Secondary | ICD-10-CM | POA: Diagnosis not present

## 2016-11-28 DIAGNOSIS — N183 Chronic kidney disease, stage 3 unspecified: Secondary | ICD-10-CM | POA: Diagnosis present

## 2016-11-28 DIAGNOSIS — I10 Essential (primary) hypertension: Secondary | ICD-10-CM | POA: Diagnosis present

## 2016-11-28 DIAGNOSIS — F17210 Nicotine dependence, cigarettes, uncomplicated: Secondary | ICD-10-CM | POA: Diagnosis not present

## 2016-11-28 DIAGNOSIS — E860 Dehydration: Secondary | ICD-10-CM

## 2016-11-28 DIAGNOSIS — R001 Bradycardia, unspecified: Secondary | ICD-10-CM | POA: Diagnosis not present

## 2016-11-28 DIAGNOSIS — F17200 Nicotine dependence, unspecified, uncomplicated: Secondary | ICD-10-CM | POA: Diagnosis present

## 2016-11-28 DIAGNOSIS — I129 Hypertensive chronic kidney disease with stage 1 through stage 4 chronic kidney disease, or unspecified chronic kidney disease: Secondary | ICD-10-CM | POA: Diagnosis not present

## 2016-11-28 DIAGNOSIS — I82409 Acute embolism and thrombosis of unspecified deep veins of unspecified lower extremity: Secondary | ICD-10-CM | POA: Diagnosis present

## 2016-11-28 LAB — COMPREHENSIVE METABOLIC PANEL
ALK PHOS: 59 U/L (ref 38–126)
ALT: 17 U/L (ref 14–54)
AST: 14 U/L — ABNORMAL LOW (ref 15–41)
Albumin: 3.9 g/dL (ref 3.5–5.0)
Anion gap: 9 (ref 5–15)
BUN: 21 mg/dL — ABNORMAL HIGH (ref 6–20)
CALCIUM: 9.1 mg/dL (ref 8.9–10.3)
CO2: 22 mmol/L (ref 22–32)
CREATININE: 1.69 mg/dL — AB (ref 0.44–1.00)
Chloride: 109 mmol/L (ref 101–111)
GFR, EST AFRICAN AMERICAN: 37 mL/min — AB (ref >60)
GFR, EST NON AFRICAN AMERICAN: 32 mL/min — AB (ref >60)
Glucose, Bld: 133 mg/dL — ABNORMAL HIGH (ref 65–99)
Potassium: 4.2 mmol/L (ref 3.5–5.1)
SODIUM: 140 mmol/L (ref 135–145)
Total Bilirubin: 0.7 mg/dL (ref 0.3–1.2)
Total Protein: 6.9 g/dL (ref 6.5–8.1)

## 2016-11-28 LAB — CBC WITH DIFFERENTIAL/PLATELET
BASOS PCT: 0 %
Basophils Absolute: 0 10*3/uL (ref 0.0–0.1)
EOS ABS: 0.1 10*3/uL (ref 0.0–0.7)
Eosinophils Relative: 0 %
HEMATOCRIT: 36.3 % (ref 36.0–46.0)
HEMOGLOBIN: 12.1 g/dL (ref 12.0–15.0)
Lymphocytes Relative: 19 %
Lymphs Abs: 2.2 10*3/uL (ref 0.7–4.0)
MCH: 29.9 pg (ref 26.0–34.0)
MCHC: 33.3 g/dL (ref 30.0–36.0)
MCV: 89.6 fL (ref 78.0–100.0)
MONOS PCT: 3 %
Monocytes Absolute: 0.4 10*3/uL (ref 0.1–1.0)
NEUTROS ABS: 9.1 10*3/uL — AB (ref 1.7–7.7)
NEUTROS PCT: 78 %
Platelets: 215 10*3/uL (ref 150–400)
RBC: 4.05 MIL/uL (ref 3.87–5.11)
RDW: 13.1 % (ref 11.5–15.5)
WBC: 11.8 10*3/uL — ABNORMAL HIGH (ref 4.0–10.5)

## 2016-11-28 LAB — I-STAT TROPONIN, ED: Troponin i, poc: 0 ng/mL (ref 0.00–0.08)

## 2016-11-28 LAB — PROTIME-INR
INR: 1.49
PROTHROMBIN TIME: 18.2 s — AB (ref 11.4–15.2)

## 2016-11-28 NOTE — ED Notes (Signed)
Patient transported to X-ray 

## 2016-11-28 NOTE — ED Notes (Signed)
Delay in lab draw,  Provider at bedside. 

## 2016-11-28 NOTE — ED Provider Notes (Signed)
[facility]-EMERGENCY DEPT Provider Note   CSN: [id number] Arrival date & time: 11/28/16  2124     History   Chief Complaint Chief Complaint  Patient presents with  . Loss of Consciousness    HPI Marie Willis is a 61 y.o. female.  HPI   Marie Willis is a 61 y.o. female, with a history of CKD stage [name], HTN, chronic back pain, and DVT, presenting to the ED with a syncopal episode.  States she was at a friend's house, got up from the table outside, began walking up a small hill, and started to get dizzy. She states she sank to her knees and then sat on the ground. She then tried to stand and then lost consciousness, but adds that she can remember hearing her friends calling 911. Episode accompanied by nausea. She was unconscious for an unknown amount of time. She does note that she just before this episode, she felt a pain up and down the left side of her neck, but this is not necessarily abnormal for her. With EMS patient had BP 88/52 and sinus brady at a rate of 44-50 bpm. Denies alcohol and illicit drug use. 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 Last INR was 3.40 on 7/9.   Denies CP, SOB, vomiting/diarrhea, fever/chills, abdominal pain, weakness/numbness, headache, or any other complaints.    Past Medical History:  Diagnosis Date  . Anxiety   . Chronic kidney disease, stage 3 06/27/2014  . Chronic pain syndrome 01/02/2014  . Depression   . DVT (deep venous thrombosis) (HCC) 01/12/2011   DVT noted in R peroneal vein 01/11/11, started on lovenox & coumadin-1st episode. Treated for approx 6 mo. Reoccurrence 04/2013, on life-long Coumadin now.   . 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 Qualifier: Diagnosis of  By: Eben Burow MD, Ankit     . Hypertension   . Low back pain     Patient Active Problem List   Diagnosis Date Noted  . Syncope 11/29/2016  . Right knee  pain 07/22/2016  . Thigh cramp 03/08/2016  . Left knee pain 08/27/2015  . Chronic anticoagulation 06/27/2014  . Chronic kidney disease, stage 3 06/27/2014  . Chronic pain syndrome 01/02/2014  . H/O dental abscess 10/05/2012  . Shortness of breath 10/24/2011  . DVT (deep venous thrombosis) (HCC) 01/12/2011  . Healthcare maintenance 01/12/2011  . TOBACCO USER 02/09/2010  . ANXIETY 07/16/2009  . Essential hypertension 07/16/2009  . Herniated lumbar disc without myelopathy 07/16/2009  . Depression 07/16/2009    Past Surgical History:  Procedure Laterality Date  . LUMBAR LAMINECTOMY      OB History    No data available       Home Medications    Prior to Admission medications   Medication Sig Start Date End Date Taking? Authorizing Provider  cetirizine (ZYRTEC) 10 MG tablet Take 10 mg by mouth daily.    [provider]  diclofenac sodium (VOLTAREN) 1 % GEL Apply 4 g topically 4 (four) times daily. 08/27/15   Ruben Im, MD  lisinopril (PRINIVIL,ZESTRIL) 20 MG tablet TAKE 1 TABLET (20 MG TOTAL) BY MOUTH DAILY. 07/05/16   Alm Bustard, MD  meloxicam (MOBIC) 7.5 MG tablet Take 1 tablet (7.5 mg total) by mouth daily. As needed for pain.  Do not use more than 4 days per week. 09/29/16   Alm Bustard, MD  warfarin (COUMADIN) 5 MG tablet  Take 1 tablet  (5mg ) on Mondays-Fridays; On Saturdays and Sundays, take 1 and 1/2 tablets of your 5mg  peach-colored warfarin tablets. 10/18/16   Inez CatalinaMullen, Emily B, MD    Family History History reviewed. No pertinent family history.  Social History Social History  Substance Use Topics  . Smoking status: Current Every Day Smoker    Packs/day: 0.50    Types: Cigarettes  . Smokeless tobacco: Never Used     Comment: Stress lately.  Waiting to do with husband  . Alcohol use No     Allergies   Tramadol hcl; Amoxicillin; Celexa [citalopram hydrobromide]; Elavil [amitriptyline hcl]; Gabapentin; Paroxetine hcl; Sulfonamide  derivatives; Trazodone and nefazodone; and Zolpidem tartrate   Review of Systems Review of Systems  Constitutional: Negative for chills, diaphoresis and fever.  Eyes: Negative for visual disturbance.  Respiratory: Negative for shortness of breath.   Cardiovascular: Negative for chest pain.  Gastrointestinal: Positive for nausea. Negative for abdominal pain, diarrhea and vomiting.  Neurological: Positive for dizziness and syncope. Negative for weakness, numbness and headaches.  All other systems reviewed and are negative.    Physical Exam Updated Vital Signs Pulse (!) 45   Temp 98.1 F (36.7 C) (Oral)   Resp 11   Ht 5\' 6"  (1.676 m)   Wt 87.1 kg (192 lb)   LMP 08/14/2010   SpO2 100%   BMI 30.99 kg/m   Physical Exam  Constitutional: She appears well-developed and well-nourished. No distress.  HENT:  Head: Normocephalic and atraumatic.  Right Ear: External ear and ear canal normal.  Left Ear: External ear and ear canal normal.  Cerumen in the bilateral canals.  Eyes: Pupils are equal, round, and reactive to light. Conjunctivae and EOM are normal.  Neck: Normal range of motion. Neck supple.  Cardiovascular: Regular rhythm, normal heart sounds and intact distal pulses.  Bradycardia present.   Pulmonary/Chest: Effort normal and breath sounds normal. No respiratory distress.  Abdominal: Soft. There is no tenderness. There is no guarding.  Musculoskeletal: She exhibits no edema.  Normal motor function intact in all extremities and spine. No midline spinal tenderness.   Lymphadenopathy:    She has no cervical adenopathy.  Neurological: She is alert.  No sensory deficits. Strength 5/5 in all extremities. No upright ataxia. Coordination intact including heel to shin and finger to nose. Cranial nerves [name]-XII grossly intact. No facial droop.   Skin: Skin is warm and dry. She is not diaphoretic.  Psychiatric: She has a normal mood and affect. Her behavior is normal.  Nursing note  and vitals reviewed.    ED Treatments / Results  Labs (all labs ordered are listed, but only abnormal results are displayed) Labs Reviewed  URINALYSIS, ROUTINE W REFLEX MICROSCOPIC - Abnormal; Notable for the following:       Result Value   APPearance CLOUDY (*)    Nitrite POSITIVE (*)    Leukocytes, UA SMALL (*)    All other components within normal limits  COMPREHENSIVE METABOLIC PANEL - Abnormal; Notable for the following:    Glucose, Bld 133 (*)    BUN 21 (*)    Creatinine, Ser 1.69 (*)    AST 14 (*)    GFR calc non Af Amer 32 (*)    GFR calc Af Amer 37 (*)    All other components within normal limits  CBC WITH DIFFERENTIAL/PLATELET - Abnormal; Notable for the following:    WBC 11.8 (*)    Neutro Abs 9.1 (*)    All  other components within normal limits  PROTIME-INR - Abnormal; Notable for the following:    Prothrombin Time 18.2 (*)    All other components within normal limits  RAPID URINE DRUG SCREEN, HOSP PERFORMED - Abnormal; Notable for the following:    Opiates POSITIVE (*)    Benzodiazepines POSITIVE (*)    Tetrahydrocannabinol POSITIVE (*)    All other components within normal limits  URINALYSIS, MICROSCOPIC (REFLEX) - Abnormal; Notable for the following:    Bacteria, UA FEW (*)    Squamous Epithelial / LPF 0-5 (*)    All other components within normal limits  ETHANOL  I-STAT TROPONIN, ED  CBG MONITORING, ED    EKG  EKG Interpretation  Date/Time:  Sunday November 28 2016 21:44:29 EDT Ventricular Rate:  47 PR Interval:    QRS Duration: 101 QT Interval:  470 QTC Calculation: 416 R Axis:   30 Text Interpretation:  Sinus bradycardia Low voltage, precordial leads Since prior ECG, rate has slowed, no other significant changes Confirmed by Alvira Monday ([id number]) on 11/28/2016 10:37:16 PM       Radiology Dg Chest 2 View  Result Date: 11/28/2016 CLINICAL DATA:  Acute onset of syncope and dizziness. Initial encounter. EXAM: CHEST  2 VIEW COMPARISON:  None.  FINDINGS: The lungs are well-aerated and clear. There is no evidence of focal opacification, pleural effusion or pneumothorax. The heart is normal in size; the mediastinal contour is within normal limits. No acute osseous abnormalities are seen. IMPRESSION: No acute cardiopulmonary process seen. Electronically Signed   By: Roanna Raider M.D.   On: 11/28/2016 22:33   Ct Head Wo Contrast  Result Date: 11/28/2016 CLINICAL DATA:  Acute onset of dizziness, nausea and syncope. Initial encounter. EXAM: CT HEAD WITHOUT CONTRAST CT CERVICAL SPINE WITHOUT CONTRAST TECHNIQUE: Multidetector CT imaging of the head and cervical spine was performed following the standard protocol without intravenous contrast. Multiplanar CT image reconstructions of the cervical spine were also generated. COMPARISON:  MRI of the cervical spine performed 03/05/2014 FINDINGS: CT HEAD FINDINGS Brain: No evidence of acute infarction, hemorrhage, hydrocephalus, extra-axial collection or mass lesion/mass effect. The posterior fossa, including the cerebellum, brainstem and fourth ventricle, is within normal limits. The third and lateral ventricles, and basal ganglia are unremarkable in appearance. The cerebral hemispheres are symmetric in appearance, with normal gray-white differentiation. No mass effect or midline shift is seen. Vascular: No hyperdense vessel or unexpected calcification. Skull: There is no evidence of fracture; visualized osseous structures are unremarkable in appearance. Sinuses/Orbits: The orbits are within normal limits. The paranasal sinuses and mastoid air cells are well-aerated. Other: No significant soft tissue abnormalities are seen. CT CERVICAL SPINE FINDINGS Alignment: There is minimal grade 1 anterolisthesis of C4 on C5. Skull base and vertebrae: No acute fracture. No primary bone lesion or focal pathologic process. There is incomplete fusion of the posterior arch of C1. Soft tissues and spinal canal: No prevertebral fluid  or swelling. No visible canal hematoma. Disc levels: Minimal disc space narrowing is noted at C5-C6. Facet disease is noted along the mid cervical spine. Upper chest: The visualized lung apices are clear. The thyroid gland is unremarkable. Other: No additional soft tissue abnormalities are seen. IMPRESSION: 1. No evidence of traumatic intracranial injury or fracture. 2. No evidence of fracture or subluxation along the cervical spine. 3. Minimal degenerative change along the mid cervical spine. Electronically Signed   By: Roanna Raider M.D.   On: 11/28/2016 23:35   Ct Cervical Spine Wo Contrast  Result Date: 11/28/2016 CLINICAL DATA:  Acute onset of dizziness, nausea and syncope. Initial encounter. EXAM: CT HEAD WITHOUT CONTRAST CT CERVICAL SPINE WITHOUT CONTRAST TECHNIQUE: Multidetector CT imaging of the head and cervical spine was performed following the standard protocol without intravenous contrast. Multiplanar CT image reconstructions of the cervical spine were also generated. COMPARISON:  MRI of the cervical spine performed 03/05/2014 FINDINGS: CT HEAD FINDINGS Brain: No evidence of acute infarction, hemorrhage, hydrocephalus, extra-axial collection or mass lesion/mass effect. The posterior fossa, including the cerebellum, brainstem and fourth ventricle, is within normal limits. The third and lateral ventricles, and basal ganglia are unremarkable in appearance. The cerebral hemispheres are symmetric in appearance, with normal gray-white differentiation. No mass effect or midline shift is seen. Vascular: No hyperdense vessel or unexpected calcification. Skull: There is no evidence of fracture; visualized osseous structures are unremarkable in appearance. Sinuses/Orbits: The orbits are within normal limits. The paranasal sinuses and mastoid air cells are well-aerated. Other: No significant soft tissue abnormalities are seen. CT CERVICAL SPINE FINDINGS Alignment: There is minimal grade 1 anterolisthesis of C4  on C5. Skull base and vertebrae: No acute fracture. No primary bone lesion or focal pathologic process. There is incomplete fusion of the posterior arch of C1. Soft tissues and spinal canal: No prevertebral fluid or swelling. No visible canal hematoma. Disc levels: Minimal disc space narrowing is noted at C5-C6. Facet disease is noted along the mid cervical spine. Upper chest: The visualized lung apices are clear. The thyroid gland is unremarkable. Other: No additional soft tissue abnormalities are seen. IMPRESSION: 1. No evidence of traumatic intracranial injury or fracture. 2. No evidence of fracture or subluxation along the cervical spine. 3. Minimal degenerative change along the mid cervical spine. Electronically Signed   By: Roanna Raider M.D.   On: 11/28/2016 23:35    Procedures Procedures (including critical care time)  Medications Ordered in ED Medications  sodium chloride 0.9 % bolus 1,000 mL (not administered)     Initial Impression / Assessment and Plan / ED Course  I have reviewed the triage vital signs and the nursing notes.  Pertinent labs & imaging results that were available during my care of the patient were reviewed by me and considered in my medical decision making (see chart for details).  Clinical Course as of Nov 29 105  Mon Nov 29, 2016  0104 Spoke with Dr. Robb Matar, hospitalist, who agreed to admit the patient.  [SJ]    Clinical Course User Index [SJ] Zakariah Urwin C, PA-C     Patient presents following a syncopal episode. Noted to be hypotensive at 96/60 and bradycardic at 46 bpm upon my initial contact with the patient. Both values improved over the course. Although patient had no recurrence of her syncope, I think she would benefit from observation admission.    Findings and plan of care discussed with Alvira Monday, MD.   Orthostatic VS for the past 24 hrs:  BP- Lying Pulse- Lying BP- Sitting Pulse- Sitting BP- Standing at 0 minutes Pulse- Standing at 0  minutes  11/29/16 0002 96/56 50 114/68 52 99/77 57    Vitals:   11/28/16 2134 11/28/16 2200 11/28/16 2342 11/29/16 0015  BP:  102/64 102/83 (!) 112/92  Pulse:  (!) 49 (!) 54 (!) 54  Resp:  12 (!) 21 11  Temp:      TempSrc:      SpO2:  100% 98% 98%  Weight: 87.1 kg (192 lb)     Height: 5\' 6"  (  1.676 m)         Final Clinical Impressions(s) / ED Diagnoses   Final diagnoses:  Syncope and collapse    New Prescriptions New Prescriptions   No medications on file     Concepcion LivingJoy, Corena Tilson C, PA-C 11/29/16 0107    Alvira MondaySchlossman, Erin, MD 12/04/16 2209

## 2016-11-28 NOTE — ED Notes (Signed)
ED Provider at bedside. 

## 2016-11-28 NOTE — ED Triage Notes (Addendum)
Per EMS, pt had an episode of syncope at 2020 tonight. Pt reports she felt dizzy and nauseous. Pt denies any pain. Pt lost consciousness, denies hitting head, is on Warfarin for hx of DVTs. Pt fell on left elbow and knees. Pt received of saline by EMS. Hx of HTN, takes Lisinopril. EMS VS, sinus brady 44-50. BP 88/52, CBG 108.

## 2016-11-29 ENCOUNTER — Ambulatory Visit: Payer: Medicaid Other

## 2016-11-29 ENCOUNTER — Encounter (HOSPITAL_COMMUNITY): Payer: Self-pay | Admitting: Internal Medicine

## 2016-11-29 ENCOUNTER — Telehealth: Payer: Self-pay

## 2016-11-29 ENCOUNTER — Observation Stay (HOSPITAL_BASED_OUTPATIENT_CLINIC_OR_DEPARTMENT_OTHER): Payer: Medicaid Other

## 2016-11-29 DIAGNOSIS — Z7901 Long term (current) use of anticoagulants: Secondary | ICD-10-CM

## 2016-11-29 DIAGNOSIS — Z79891 Long term (current) use of opiate analgesic: Secondary | ICD-10-CM | POA: Diagnosis not present

## 2016-11-29 DIAGNOSIS — R55 Syncope and collapse: Principal | ICD-10-CM

## 2016-11-29 DIAGNOSIS — Z882 Allergy status to sulfonamides status: Secondary | ICD-10-CM

## 2016-11-29 DIAGNOSIS — I129 Hypertensive chronic kidney disease with stage 1 through stage 4 chronic kidney disease, or unspecified chronic kidney disease: Secondary | ICD-10-CM | POA: Diagnosis not present

## 2016-11-29 DIAGNOSIS — F418 Other specified anxiety disorders: Secondary | ICD-10-CM | POA: Diagnosis not present

## 2016-11-29 DIAGNOSIS — N183 Chronic kidney disease, stage 3 (moderate): Secondary | ICD-10-CM

## 2016-11-29 DIAGNOSIS — Z79899 Other long term (current) drug therapy: Secondary | ICD-10-CM

## 2016-11-29 DIAGNOSIS — I82409 Acute embolism and thrombosis of unspecified deep veins of unspecified lower extremity: Secondary | ICD-10-CM

## 2016-11-29 DIAGNOSIS — Z885 Allergy status to narcotic agent status: Secondary | ICD-10-CM | POA: Diagnosis not present

## 2016-11-29 DIAGNOSIS — Z888 Allergy status to other drugs, medicaments and biological substances status: Secondary | ICD-10-CM

## 2016-11-29 DIAGNOSIS — F17200 Nicotine dependence, unspecified, uncomplicated: Secondary | ICD-10-CM

## 2016-11-29 DIAGNOSIS — M5126 Other intervertebral disc displacement, lumbar region: Secondary | ICD-10-CM

## 2016-11-29 DIAGNOSIS — Z881 Allergy status to other antibiotic agents status: Secondary | ICD-10-CM | POA: Diagnosis not present

## 2016-11-29 DIAGNOSIS — E860 Dehydration: Secondary | ICD-10-CM

## 2016-11-29 LAB — RAPID URINE DRUG SCREEN, HOSP PERFORMED
Amphetamines: NOT DETECTED
BENZODIAZEPINES: POSITIVE — AB
Barbiturates: NOT DETECTED
Cocaine: NOT DETECTED
Opiates: POSITIVE — AB
Tetrahydrocannabinol: POSITIVE — AB

## 2016-11-29 LAB — URINALYSIS, ROUTINE W REFLEX MICROSCOPIC
BILIRUBIN URINE: NEGATIVE
GLUCOSE, UA: NEGATIVE mg/dL
Hgb urine dipstick: NEGATIVE
Ketones, ur: NEGATIVE mg/dL
Nitrite: POSITIVE — AB
PROTEIN: NEGATIVE mg/dL
Specific Gravity, Urine: 1.01 (ref 1.005–1.030)
pH: 6 (ref 5.0–8.0)

## 2016-11-29 LAB — TROPONIN I

## 2016-11-29 LAB — PROTIME-INR
INR: 1.57
PROTHROMBIN TIME: 18.9 s — AB (ref 11.4–15.2)

## 2016-11-29 LAB — HIV ANTIBODY (ROUTINE TESTING W REFLEX): HIV SCREEN 4TH GENERATION: NONREACTIVE

## 2016-11-29 LAB — URINALYSIS, MICROSCOPIC (REFLEX)

## 2016-11-29 LAB — ECHOCARDIOGRAM COMPLETE
HEIGHTINCHES: 66 in
Weight: 3123.2 oz

## 2016-11-29 LAB — ETHANOL: Alcohol, Ethyl (B): <5 mg/dL (ref <5)

## 2016-11-29 LAB — GLUCOSE, CAPILLARY: Glucose-Capillary: 116 mg/dL — ABNORMAL HIGH (ref 65–99)

## 2016-11-29 MED ORDER — OXYMETAZOLINE HCL 0.05 % NA SOLN
1.0000 | Freq: Two times a day (BID) | NASAL | Status: DC
  Administered 2016-11-29: 1 via NASAL
  Filled 2016-11-29: qty 15

## 2016-11-29 MED ORDER — LORATADINE 10 MG PO TABS
10.0000 mg | ORAL_TABLET | Freq: Every day | ORAL | Status: DC
  Administered 2016-11-29: 10 mg via ORAL
  Filled 2016-11-29: qty 1

## 2016-11-29 MED ORDER — MORPHINE SULFATE 15 MG PO TABS: 15.0000 mg | ORAL_TABLET | ORAL | Status: DC | PRN

## 2016-11-29 MED ORDER — NICOTINE 21 MG/24HR TD PT24
21.0000 mg | MEDICATED_PATCH | Freq: Every day | TRANSDERMAL | Status: DC
  Filled 2016-11-29: qty 1

## 2016-11-29 MED ORDER — SODIUM CHLORIDE 0.9 % IV BOLUS (SEPSIS)
1000.0000 mL | Freq: Once | INTRAVENOUS | Status: AC
  Administered 2016-11-29: 1000 mL via INTRAVENOUS

## 2016-11-29 MED ORDER — SODIUM CHLORIDE 0.9% FLUSH: 3.0000 mL | Freq: Two times a day (BID) | INTRAVENOUS | Status: DC

## 2016-11-29 MED ORDER — MORPHINE SULFATE 30 MG PO TABS: 15.0000 mg | ORAL_TABLET | ORAL | 0 refills | Status: DC | PRN

## 2016-11-29 MED ORDER — ACETAMINOPHEN 325 MG PO TABS
650.0000 mg | ORAL_TABLET | Freq: Four times a day (QID) | ORAL | Status: DC | PRN
  Administered 2016-11-29: 650 mg via ORAL
  Filled 2016-11-29: qty 2

## 2016-11-29 MED ORDER — ONDANSETRON HCL 4 MG/2ML IJ SOLN: 4.0000 mg | Freq: Four times a day (QID) | INTRAMUSCULAR | Status: DC | PRN

## 2016-11-29 MED ORDER — BUSPIRONE HCL 10 MG PO TABS
10.0000 mg | ORAL_TABLET | Freq: Two times a day (BID) | ORAL | Status: DC
  Administered 2016-11-29: 10 mg via ORAL
  Filled 2016-11-29: qty 1

## 2016-11-29 MED ORDER — WARFARIN - PHARMACIST DOSING INPATIENT: Freq: Every day | Status: DC

## 2016-11-29 MED ORDER — ONDANSETRON HCL 4 MG PO TABS: 4.0000 mg | ORAL_TABLET | Freq: Four times a day (QID) | ORAL | Status: DC | PRN

## 2016-11-29 MED ORDER — OLOPATADINE HCL 0.1 % OP SOLN
1.0000 [drp] | Freq: Two times a day (BID) | OPHTHALMIC | Status: DC
  Administered 2016-11-29: 1 [drp] via OPHTHALMIC
  Filled 2016-11-29: qty 5

## 2016-11-29 MED ORDER — ALPRAZOLAM 0.5 MG PO TABS: 0.5000 mg | ORAL_TABLET | Freq: Two times a day (BID) | ORAL | Status: DC | PRN

## 2016-11-29 MED ORDER — SODIUM CHLORIDE 0.9 % IV SOLN: INTRAVENOUS | Status: DC

## 2016-11-29 MED ORDER — WARFARIN SODIUM 5 MG PO TABS: 5.0000 mg | ORAL_TABLET | Freq: Once | ORAL | Status: DC

## 2016-11-29 NOTE — Progress Notes (Signed)
Received report from ED RN.

## 2016-11-29 NOTE — Progress Notes (Signed)
Chief Complaint    Chief Complaint  Patient presents with  . Loss of Consciousness    HPI Marie Willis is a 61 y.o. female.  HPI   Marie Willis is a 60 y.o. female, with a history of CKD stage [name], HTN, chronic back pain, and DVT, presenting to the ED with a syncopal episode.  States she was at a friend's house, got up from the table outside, began walking up a small hill, and started to get dizzy. She states she sank to her knees and then sat on the ground. She then tried to stand and then lost consciousness, but adds that she can remember hearing her friends calling 911. Episode accompanied by nausea. She was unconscious for an unknown amount of time. She does note that she just before this episode, she felt a pain up and down the left side of her neck, but this is not necessarily abnormal for her. With EMS patient had BP 88/52 and sinus brady at a rate of 44-50 bpm. Denies alcohol and illicit drug use. 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 Last INR was 3.40 on 7/9.   Denies CP, SOB, vomiting/diarrhea, fever/chills, abdominal pain, weakness/numbness, headache, or any other complaints.        Past Medical History:  Diagnosis Date  . Anxiety   . Chronic kidney disease, stage 3 06/27/2014  . Chronic pain syndrome 01/02/2014  . Depression   . DVT (deep venous thrombosis) (HCC) 01/12/2011   DVT noted in R peroneal vein 01/11/11, started on lovenox & coumadin-1st episode. Treated for approx 6 mo. Reoccurrence 04/2013, on life-long Coumadin now.   . 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 Qualifier: Diagnosis of  By: Eben Burow MD, Ankit     . Hypertension   . Low back pain         Patient Active Problem List   Diagnosis Date Noted  . Syncope 11/29/2016  . Right knee pain 07/22/2016  . Thigh cramp 03/08/2016  . Left knee pain 08/27/2015  .  Chronic anticoagulation 06/27/2014  . Chronic kidney disease, stage 3 06/27/2014  . Chronic pain syndrome 01/02/2014  . H/O dental abscess 10/05/2012  . Shortness of breath 10/24/2011  . DVT (deep venous thrombosis) (HCC) 01/12/2011  . Healthcare maintenance 01/12/2011  . TOBACCO USER 02/09/2010  . ANXIETY 07/16/2009  . Essential hypertension 07/16/2009  . Herniated lumbar disc without myelopathy 07/16/2009  . Depression 07/16/2009         Past Surgical History:  Procedure Laterality Date  . LUMBAR LAMINECTOMY         OB History    No data available       Home Medications            Prior to Admission medications   Medication Sig Start Date End Date Taking? Authorizing Provider  cetirizine (ZYRTEC) 10 MG tablet Take 10 mg by mouth daily.    [provider]  diclofenac sodium (VOLTAREN) 1 % GEL Apply 4 g topically 4 (four) times daily. 08/27/15   Ruben Im, MD  lisinopril (PRINIVIL,ZESTRIL) 20 MG tablet TAKE 1 TABLET (20 MG TOTAL) BY MOUTH DAILY. 07/05/16   Alm Bustard, MD  meloxicam (MOBIC) 7.5 MG tablet Take 1 tablet (7.5 mg total) by mouth daily. As needed for pain.  Do not use more than 4 days per week. 09/29/16   Alm Bustard, MD  warfarin (COUMADIN) 5 MG tablet Take 1 tablet  (5mg ) on Mondays-Fridays; On Saturdays and Sundays, take 1 and 1/2 tablets of your 5mg  peach-colored warfarin tablets.        Patient arrived from the emergency room on stretcher and walked to the bed.  Alert and oriented x 4, skin intact, pain rated 8/10 in back (chronic back pain).  Placed on telemetry box 31, oriented to room and call bell, admission history complete.  Patient here for observation.  Will continue to monitor.  Macarthur CritchleyMarie Maika Mcelveen, RN

## 2016-11-29 NOTE — Progress Notes (Signed)
Patient alert and oriented and following commands, patient does not want a low bed.  Patient will use call bell when needing assistance.  Will continue to monitor.  Macarthur CritchleyMarie Marylyn Appenzeller, RN

## 2016-11-29 NOTE — Progress Notes (Signed)
ANTICOAGULATION CONSULT NOTE - Initial Consult  Pharmacy Consult for Coumadin Indication: h/o DVT  Allergies  Allergen Reactions  . Tramadol Hcl Shortness Of Breath  . Amoxicillin     REACTION: inside mouth of raw  . Celexa [Citalopram Hydrobromide] Nausea Only and Other (See Comments)    dizzy  . Elavil [Amitriptyline Hcl]     Makes her "feel like zombie."  . Gabapentin     REACTION: "severe visual disturbances"  . Paroxetine Hcl     Memory loss  . Sulfonamide Derivatives     REACTION: "sulfa antibiotics" rash  . Trazodone And Nefazodone Other (See Comments)    unknown  . Zolpidem Tartrate     REACTION: sleepwalks    Patient Measurements: Height: 5\' 6"  (167.6 cm) Weight: 192 lb (87.1 kg) IBW/kg (Calculated) : 59.3  Vital Signs: Temp: 98.1 F (36.7 C) (08/05 2133) Temp Source: Oral (08/05 2133) BP: 108/63 (08/06 0200) Pulse Rate: 63 (08/06 0200)  Labs:  Recent Labs  11/28/16 2251  HGB 12.1  HCT 36.3  PLT 215  LABPROT 18.2*  INR 1.49  CREATININE 1.69*    Estimated Creatinine Clearance: 39.8 mL/min (A) (by C-G formula based on SCr of 1.69 mg/dL (H)).   Medical History: Past Medical History:  Diagnosis Date  . Anxiety   . Chronic kidney disease, stage 3 06/27/2014  . Chronic pain syndrome 01/02/2014  . Depression   . DVT (deep venous thrombosis) (HCC) 01/12/2011   DVT noted in R peroneal vein 01/11/11, started on lovenox & coumadin-1st episode. Treated for approx 6 mo. Reoccurrence 04/2013, on life-long Coumadin now.   . 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 Qualifier: Diagnosis of  By: Eben BurowGarg MD, Ankit     . Hypertension   . Low back pain     Medications:  No current facility-administered medications on file prior to encounter.    Current Outpatient Prescriptions on File Prior to Encounter  Medication Sig Dispense Refill  . cetirizine (ZYRTEC)  10 MG tablet Take 10 mg by mouth daily.    . diclofenac sodium (VOLTAREN) 1 % GEL Apply 4 g topically 4 (four) times daily. (Patient taking differently: Apply 4 g topically 4 (four) times daily as needed (pain). ) 1 Tube 3  . lisinopril (PRINIVIL,ZESTRIL) 20 MG tablet TAKE 1 TABLET (20 MG TOTAL) BY MOUTH DAILY. 90 tablet 1  . meloxicam (MOBIC) 7.5 MG tablet Take 1 tablet (7.5 mg total) by mouth daily. As needed for pain.  Do not use more than 4 days per week. (Patient taking differently: Take 7.5 mg by mouth daily as needed for pain. ) 30 tablet 0  . warfarin (COUMADIN) 5 MG tablet Take 1 tablet  (5mg ) on Mondays-Fridays; On Saturdays and Sundays, take 1 and 1/2 tablets of your 5mg  peach-colored warfarin tablets. (Patient taking differently: Take 2.5-5 mg by mouth every evening. Take 1 table Saturday, Sunday, Tuesday and Thursday.  Take 1/2 tablet all other days) 30 tablet 2     Assessment: 61 y.o. female admitted with syncope, h/o DVT, to continue Coumadin  Goal of Therapy:  INR 2-3 Monitor platelets by anticoagulation protocol: Yes   Plan:  Coumadin 5 mg today Daily INR  Micayla Brathwaite, Gary FleetGregory Vernon 11/29/2016,2:54 AM

## 2016-11-29 NOTE — ED Notes (Addendum)
Pt given sandwich and a coke. Pt tolerating well. Will continue to monitor.

## 2016-11-29 NOTE — Discharge Summary (Signed)
Name: Marie Willis MRN: [id number] DOB: May 03, 1955 62 y.o. PCP: Angelita Ingles, MD  Date of Admission: 11/28/2016  9:24 PM Date of Discharge: 11/29/2016 Attending Physician: Doneen Poisson, MD  Discharge Diagnosis: 1. Syncope, likely secondary to dehydration  Principal Problem:   Syncope Active Problems:   Anxiety with depression   TOBACCO USER   Essential hypertension   Herniated lumbar disc without myelopathy   DVT (deep venous thrombosis) (HCC)   Chronic kidney disease, stage 3   Discharge Medications: Allergies as of 11/29/2016      Reactions   Tramadol Hcl Shortness Of Breath   Amoxicillin    REACTION: inside mouth of raw   Celexa [citalopram Hydrobromide] Nausea Only, Other (See Comments)   dizzy   Elavil [amitriptyline Hcl]    Makes her "feel like zombie."   Gabapentin    REACTION: "severe visual disturbances"   Paroxetine Hcl    Memory loss   Sulfonamide Derivatives    REACTION: "sulfa antibiotics" rash   Trazodone And Nefazodone Other (See Comments)   unknown   Zolpidem Tartrate    REACTION: sleepwalks      Medication List    TAKE these medications   acetaminophen 500 MG tablet Commonly known as:  TYLENOL Take 1,000 mg by mouth every 6 (six) hours as needed for mild pain.   ALPRAZolam 1 MG tablet Commonly known as:  XANAX Take 0.5-1 mg by mouth 2 (two) times daily as needed for anxiety.   busPIRone 10 MG tablet Commonly known as:  BUSPAR Take 10 mg by mouth 2 (two) times daily.   cetirizine 10 MG tablet Commonly known as:  ZYRTEC Take 10 mg by mouth daily.   diclofenac sodium 1 % Gel Commonly known as:  VOLTAREN Apply 4 g topically 4 (four) times daily. What changed:  when to take this  reasons to take this   lisinopril 20 MG tablet Commonly known as:  PRINIVIL,ZESTRIL TAKE 1 TABLET (20 MG TOTAL) BY MOUTH DAILY.   meloxicam 7.5 MG tablet Commonly known as:  MOBIC Take 1 tablet (7.5 mg total) by mouth daily. As needed for pain.   Do not use more than 4 days per week. What changed:  when to take this  reasons to take this  additional instructions   morphine 30 MG tablet Commonly known as:  MSIR Take 0.5 tablets (15 mg total) by mouth every 4 (four) hours as needed for severe pain.   PATADAY 0.2 % Soln Generic drug:  Olopatadine HCl Apply 1-2 drops to eye 2 (two) times daily as needed (allergies).   warfarin 5 MG tablet Commonly known as:  COUMADIN Take 1 tablet  (5mg ) on Mondays-Fridays; On Saturdays and Sundays, take 1 and 1/2 tablets of your 5mg  peach-colored warfarin tablets. What changed:  how much to take  how to take this  when to take this  additional instructions       Disposition and follow-up:   Marie Willis was discharged from [facility] in Good condition.  At the hospital follow up visit please address:  1.  - Has she had any more syncopal episodes? - Begin tapering off of morphine IR 15mg  by 10% over 1 month. (Patient states she is interested in tapering)  2.  Labs / imaging needed at time of follow-up: None  3.  Pending labs/ test needing follow-up: HIV  Follow-up Appointments: Follow-up Information    Angelita Ingles, MD Follow up on 12/06/2016.   Specialty:  Internal Medicine Why:  @1 :45pm. Already scheduled. Contact information: [address] [place] [address] [phone]           Hospital Course by problem list: Principal Problem:   Syncope Active Problems:   Anxiety with depression   TOBACCO USER   Essential hypertension   Herniated lumbar disc without myelopathy   DVT (deep venous thrombosis) (HCC)   Chronic kidney disease, stage 3   1. Syncope with LOC, likely secondary to dehydration. Patient was admitted on 8/5 with syncopal episode x1 and LOC for 2-86min. On warfarin for history of DVTs. CT head negative. No changes on EKG or telemetry. Normal echo. No tonic/clonic activity, no post-ictal state, no incontinence. Received  IVF bolus, then got orthostatics in ED (HR increased by 12, otherwise normal). No current signs/symptoms concerning for CVA or seizure. Patient was back to baseline 1 hour later with no focal deficits.  2. Herniated lumbar disc without myelopathy. On morphine IR 15mg  daily. She is interested in getting off opioids. She was discharged with 7 days of morphine IR. At her PCP follow up, she can start tapering morphine by 10% over 1 month.  3. HTN. Anti-hypertensives were held in the hospital. BP stable in the hospital. Discharged with home lisinopril.  4. DVT. On Warfarin. INR 1.57 in the hospital. CT head negative. Patient was supposed to go to clinic 8/6 to have her INR checked. Discharged with home warfarin dose to follow up with INR checks as usual.  Discharge Vitals:   BP 117/70   Pulse 60   Temp 98.2 F (36.8 C) (Oral)   Resp 20   Ht 5\' 6"  (1.676 m)   Wt 195 lb 3.2 oz (88.5 kg)   LMP 08/14/2010   SpO2 99%   BMI 31.51 kg/m   Pertinent Labs, Studies, and Procedures:  CBC    Component Value Date/Time   WBC 11.8 (H) 11/28/2016 2251   RBC 4.05 11/28/2016 2251   HGB 12.1 11/28/2016 2251   HGB 12.3 04/05/2016 1438   HCT 36.3 11/28/2016 2251   HCT 36.4 04/05/2016 1438   PLT 215 11/28/2016 2251   PLT 236 04/05/2016 1438   MCV 89.6 11/28/2016 2251   MCV 88 04/05/2016 1438   MCH 29.9 11/28/2016 2251   MCHC 33.3 11/28/2016 2251   RDW 13.1 11/28/2016 2251   RDW 14.1 04/05/2016 1438   LYMPHSABS 2.2 11/28/2016 2251   MONOABS 0.4 11/28/2016 2251   EOSABS 0.1 11/28/2016 2251   BASOSABS 0.0 11/28/2016 2251   BMET    Component Value Date/Time   NA 140 11/28/2016 2251   NA 138 04/05/2016 1438   K 4.2 11/28/2016 2251   CL 109 11/28/2016 2251   CO2 22 11/28/2016 2251   GLUCOSE 133 (H) 11/28/2016 2251   BUN 21 (H) 11/28/2016 2251   BUN 24 04/05/2016 1438   CREATININE 1.69 (H) 11/28/2016 2251   CREATININE 1.35 (H) 06/27/2014 1632   CALCIUM 9.1 11/28/2016 2251   GFRNONAA 32 (L)  11/28/2016 2251   GFRNONAA 44 (L) 06/27/2014 1632   GFRAA 37 (L) 11/28/2016 2251   GFRAA 51 (L) 06/27/2014 1632   UDS: positive for opiates, benzodiazepines, and THC BAL neg Trop neg x3 INR 1.49 -> 1.57  CT head 8/5 1. No evidence of traumatic intracranial injury or fracture. 2. No evidence of fracture or subluxation along the cervical spine. 3. Minimal degenerative change along the mid cervical spine.  Echo 8/6 - Left ventricle: The cavity size  was normal. Systolic function was   normal. The estimated ejection fraction was in the range of 60%   to 65%. Wall motion was normal; there were no regional wall   motion abnormalities. - Aortic valve: Trileaflet; normal thickness, mildly calcified   leaflets. - Mitral valve: There was trivial regurgitation. - Atrial septum: There was increased thickness of the septum,   consistent with lipomatous hypertrophy. - Pulmonary arteries: Systolic pressure could not be accurately   estimated.  Discharge Instructions: Discharge Instructions    Call MD for:  difficulty breathing, headache or visual disturbances    Complete by:  As directed    Call MD for:  extreme fatigue    Complete by:  As directed    Call MD for:  persistant dizziness or light-headedness    Complete by:  As directed    Call MD for:  severe uncontrolled pain    Complete by:  As directed    Call MD for:  temperature >100.4    Complete by:  As directed    Diet - low sodium heart healthy    Complete by:  As directed    Discharge instructions    Complete by:  As directed    Please try to stay hydrated to prevent further episodes of passing out.   Increase activity slowly    Complete by:  As directed      Signed: Scherrie GerlachHuang, Lio Wehrly, MD 11/29/2016, 12:47 PM   Pager: Demetrius CharityP [phone]

## 2016-11-29 NOTE — Telephone Encounter (Signed)
Hospital TOC per Dr Renaldo ReelHuang, discharge 11/29/2016, appt 12/06/2016 @ 1:45.

## 2016-11-29 NOTE — Progress Notes (Signed)
   Subjective:  Admitted yesterday by [facility] with syncopal episode. She is a clinic patient of ours, so care was transferred to us today. She is doing great this morning. No complaints. Walked to bathroom with no recurrence of dizziness, lightheadedness, or syncope. Denies HA, CP, palpitations. Telemetry with no changes except for bradycardia. She is amenable to discharge home today.  Objective:  Vital signs in last 24 hours: Vitals:   11/29/16 0429 11/29/16 0441 11/29/16 0443 11/29/16 0503  BP: 117/64 (!) 106/57 117/70   Pulse: (!) 48 (!) 47 60   Resp: 20     Temp: 98.2 F (36.8 C)     TempSrc: Oral     SpO2: 100%  99%   Weight:    195 lb 3.2 oz (88.5 kg)  Height:    5\' 6"  (1.676 m)   GEN: Well-appearing female in NAD. Alert and oriented. EYES: EOMI. Sclera anicteric. RESP: Clear to auscultation bilaterally. No wheezes. CV: Normal rate and regular rhythm. No murmurs, gallops, or rubs. No LE edema. ABD: Soft. Non-tender. Non-distended. Normoactive bowel sounds. EXT: Bruise on R shin from yesterday's fall. No edema. 2+ DP pulses. NEURO: Cranial nerves II-XII grossly intact. Able to lift all four extremities against gravity. No apparent audiovisual hallucinations. Speech fluent and appropriate. PSYCH: Patient is calm and pleasant. Appropriate affect. Well-groomed; speech is appropriate and on-subject.  Assessment/Plan: Ms. 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 Patient is back at baseline this morning.  Principal Problem:   Syncope Active Problems:   Anxiety with depression   TOBACCO USER   Essential hypertension   Herniated lumbar disc without myelopathy   DVT (deep venous thrombosis) (HCC)   Chronic kidney disease, stage 3  # Syncope, likely secondary to dehydration No pre-syncopal aura. CT head negative. Telemetry and EKG with no  changes. Received 1L NS bolus in ED. Orthostatics negative after IVF resuscitation. Feeling back to baseline this morning. - discharge home today - f/u with PCP next week - f/u echo  # HTN BP 100s-120s/60s-80s here. - Will restart home lisinopril on discharge.  # DVT INR 1.57, PT 18.9 on warfarin - Will restart warfarin on discharge  # CKD, stage 3 Cr 1.69 (baseline ~1.2-1.9) - Continue to monitor  # Herniated lumbar disc without myelopathy On morphine IR 15mg  q4h as needed. Patient amenable to tapering off opioids. - Will follow up with PCP next week to begin tapering. - Will discharge home with morphine IR x7d (until appointment)  # Anxiety with depression - Cont buspar 10mg  BID and alprazolam PRN  Dispo: Discharge to home today  Scherrie GerlachHuang, Ernesha Ramone, MD  Internal Medicine, PGY-1 11/29/2016, 11:41 AM Pager: Demetrius CharityP [phone]

## 2016-11-29 NOTE — ED Notes (Signed)
Hospitalist at bedside 

## 2016-11-29 NOTE — H&P (Signed)
History and Physical    Marie Willis NWG:[id number] DOB: 1955/08/29 DOA: 11/28/2016  PCP: Angelita Ingles, MD   Patient coming from: Home.  I have personally briefly reviewed patient's old medical records in [facility]  Chief Complaint: Syncopal episode  HPI: Marie Willis is a 61 y.o. female with medical history significant of anxiety, chronic kidney disease, chronic pain syndrome, chronic back pain due to herniated lumbar disc without myelopathy, depression, history of DVT, hypertension who is coming to the emergency department after having a syncopal episode at home.  Per patient, she got up from her couch and started to walk, when all of a sudden she felt lightheaded and lost consciousness. Per patient, her boyfriend told her that she passed out for about 2-4 minutes. When she woke up, she was diaphoretic and felt nauseous. EMS was subsequently called and found the patient to be bradycardic in the 40s with a blood pressure 88/52 mmHg. She denies chest pain, palpitations, dyspnea, orthopnea or lower extremity edema. She denies abdominal pain, diarrhea, constipation, melena or hematochezia. Denies polyuria, polydipsia, blurred vision, dysuria, frequency or hematuria.  ED Course: Initial vital signs medicine department temperature 98.15F, pulse 45, blood pressure 102/64 mmHg, respirations 11 and O2 sat 100% on room air. The patient received normal saline 1 L bolus. EKG with sinus bradycardia. Troponin level was normal. Urinalysis shows nitrites and small leukocyte esterase. Toxicology was positive for opiates, benzos and THC. WBC 11.8, hemoglobin 12.1 g/dL and platelets 578. Her BNP shows the glucose is 133 and creatinine 1.69 mg/dL (for the past 2 years, the patient's creatinine has ranged from 1.2-1.7 mg/dL  Imaging: Chest radiograph, CT head and CT of the C-spine did not show any acute abnormalities. Please see images sent for radiology report for further details.  Review of Systems:  As per HPI otherwise 10 point review of systems negative.    Past Medical History:  Diagnosis Date  . Anxiety   . Chronic kidney disease, stage 3 06/27/2014  . Chronic pain syndrome 01/02/2014  . Depression   . DVT (deep venous thrombosis) (HCC) 01/12/2011   DVT noted in R peroneal vein 01/11/11, started on lovenox & coumadin-1st episode. Treated for approx 6 mo. Reoccurrence 04/2013, on life-long Coumadin now.   . 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 Qualifier: Diagnosis of  By: Eben Burow MD, Ankit     . Hypertension   . Low back pain     Past Surgical History:  Procedure Laterality Date  . LUMBAR LAMINECTOMY       reports that she has been smoking Cigarettes.  She has been smoking about 0.50 packs per day. She has never used smokeless tobacco. She reports that she does not drink alcohol or use drugs.  Allergies  Allergen Reactions  . Tramadol Hcl Shortness Of Breath  . Amoxicillin     REACTION: inside mouth of raw  . Celexa [Citalopram Hydrobromide] Nausea Only and Other (See Comments)    dizzy  . Elavil [Amitriptyline Hcl]     Makes her "feel like zombie."  . Gabapentin     REACTION: "severe visual disturbances"  . Paroxetine Hcl     Memory loss  . Sulfonamide Derivatives     REACTION: "sulfa antibiotics" rash  . Trazodone And Nefazodone Other (See Comments)    unknown  . Zolpidem Tartrate     REACTION: sleepwalks    Family History  Problem Relation  Age of Onset  . Adopted: Yes    Prior to Admission medications   Medication Sig Start Date End Date Taking? Authorizing Provider  acetaminophen (TYLENOL) 500 MG tablet Take 1,000 mg by mouth every 6 (six) hours as needed for mild pain.   Yes [provider]  ALPRAZolam Prudy Feeler) 1 MG tablet Take 0.5-1 mg by mouth 2 (two) times daily as needed for anxiety.   Yes [provider]  busPIRone (BUSPAR) 10  MG tablet Take 10 mg by mouth 2 (two) times daily.   Yes [provider]  cetirizine (ZYRTEC) 10 MG tablet Take 10 mg by mouth daily.   Yes [provider]  diclofenac sodium (VOLTAREN) 1 % GEL Apply 4 g topically 4 (four) times daily. Patient taking differently: Apply 4 g topically 4 (four) times daily as needed (pain).  08/27/15  Yes Ruben Im, MD  lisinopril (PRINIVIL,ZESTRIL) 20 MG tablet TAKE 1 TABLET (20 MG TOTAL) BY MOUTH DAILY. 07/05/16  Yes Alm Bustard, MD  meloxicam (MOBIC) 7.5 MG tablet Take 1 tablet (7.5 mg total) by mouth daily. As needed for pain.  Do not use more than 4 days per week. Patient taking differently: Take 7.5 mg by mouth daily as needed for pain.  09/29/16  Yes Alm Bustard, MD  morphine (MSIR) 30 MG tablet Take 15 mg by mouth every 4 (four) hours as needed for severe pain.   Yes [provider]  Olopatadine HCl (PATADAY) 0.2 % SOLN Apply 1-2 drops to eye 2 (two) times daily as needed (allergies).   Yes [provider]  warfarin (COUMADIN) 5 MG tablet Take 1 tablet  (5mg ) on Mondays-Fridays; On Saturdays and Sundays, take 1 and 1/2 tablets of your 5mg  peach-colored warfarin tablets. Patient taking differently: Take 2.5-5 mg by mouth every evening. Take 1 table Saturday, Sunday, Tuesday and Thursday.  Take 1/2 tablet all other days 10/18/16  Yes Inez Catalina, MD    Physical Exam: Vitals:   11/28/16 2200 11/28/16 2342 11/29/16 0015 11/29/16 0115  BP: 102/64 102/83 (!) 112/92 (!) 119/52  Pulse: (!) 49 (!) 54 (!) 54 (!) 55  Resp: 12 (!) 21 11 15   Temp:      TempSrc:      SpO2: 100% 98% 98% 99%  Weight:      Height:        Constitutional: NAD, calm, comfortable Eyes: PERRL, lids and conjunctivae normal ENMT: Mucous membranes are moist. Posterior pharynx clear of any exudate or lesions. Neck: normal, supple, no masses, no thyromegaly Respiratory: clear to auscultation bilaterally, no wheezing, no crackles. Normal  respiratory effort. No accessory muscle use.  Cardiovascular: bradycardia 58 BPM, no murmurs / rubs / gallops. No extremity edema. 2+ pedal pulses. No carotid bruits.  Abdomen: no tenderness, no masses palpated. No hepatosplenomegaly. Bowel sounds positive.  Musculoskeletal: no clubbing / cyanosis. Good ROM, no contractures. Normal muscle tone.  Skin: no rashes, lesions, ulcers on limited skin exam. Neurologic: CN 2-12 grossly intact. Sensation intact, DTR normal. Strength 5/5 in all 4.  Psychiatric: Normal judgment and insight. Alert and oriented x 3. Normal mood.    Labs on Admission: I have personally reviewed following labs and imaging studies  CBC:  Recent Labs Lab 11/28/16 2251  WBC 11.8*  NEUTROABS 9.1*  HGB 12.1  HCT 36.3  MCV 89.6  PLT 215   Basic Metabolic Panel:  Recent Labs Lab 11/28/16 2251  NA 140  K 4.2  CL 109  CO2 22  GLUCOSE 133*  BUN 21*  CREATININE 1.69*  CALCIUM 9.1   GFR: Estimated Creatinine Clearance: 39.8 mL/min (A) (by C-G formula based on SCr of 1.69 mg/dL (H)). Liver Function Tests:  Recent Labs Lab 11/28/16 2251  AST 14*  ALT 17  ALKPHOS 59  BILITOT 0.7  PROT 6.9  ALBUMIN 3.9   No results for input(s): LIPASE, AMYLASE in the last 168 hours. No results for input(s): AMMONIA in the last 168 hours. Coagulation Profile:  Recent Labs Lab 11/28/16 2251  INR 1.49   Cardiac Enzymes: No results for input(s): CKTOTAL, CKMB, CKMBINDEX, TROPONINI in the last 168 hours. BNP (last 3 results) No results for input(s): PROBNP in the last 8760 hours. HbA1C: No results for input(s): HGBA1C in the last 72 hours. CBG: No results for input(s): GLUCAP in the last 168 hours. Lipid Profile: No results for input(s): CHOL, HDL, LDLCALC, TRIG, CHOLHDL, LDLDIRECT in the last 72 hours. Thyroid Function Tests: No results for input(s): TSH, T4TOTAL, FREET4, T3FREE, THYROIDAB in the last 72 hours. Anemia Panel: No results for input(s): VITAMINB12,  FOLATE, FERRITIN, TIBC, IRON, RETICCTPCT in the last 72 hours. Urine analysis:    Component Value Date/Time   COLORURINE YELLOW 11/28/2016 2135   APPEARANCEUR CLOUDY (A) 11/28/2016 2135   LABSPEC 1.010 11/28/2016 2135   PHURINE 6.0 11/28/2016 2135   GLUCOSEU NEGATIVE 11/28/2016 2135   HGBUR NEGATIVE 11/28/2016 2135   BILIRUBINUR NEGATIVE 11/28/2016 2135   KETONESUR NEGATIVE 11/28/2016 2135   PROTEINUR NEGATIVE 11/28/2016 2135   NITRITE POSITIVE (A) 11/28/2016 2135   LEUKOCYTESUR SMALL (A) 11/28/2016 2135    Radiological Exams on Admission: Dg Chest 2 View  Result Date: 11/28/2016 CLINICAL DATA:  Acute onset of syncope and dizziness. Initial encounter. EXAM: CHEST  2 VIEW COMPARISON:  None. FINDINGS: The lungs are well-aerated and clear. There is no evidence of focal opacification, pleural effusion or pneumothorax. The heart is normal in size; the mediastinal contour is within normal limits. No acute osseous abnormalities are seen. IMPRESSION: No acute cardiopulmonary process seen. Electronically Signed   By: Roanna Raider M.D.   On: 11/28/2016 22:33   Ct Head Wo Contrast  Result Date: 11/28/2016 CLINICAL DATA:  Acute onset of dizziness, nausea and syncope. Initial encounter. EXAM: CT HEAD WITHOUT CONTRAST CT CERVICAL SPINE WITHOUT CONTRAST TECHNIQUE: Multidetector CT imaging of the head and cervical spine was performed following the standard protocol without intravenous contrast. Multiplanar CT image reconstructions of the cervical spine were also generated. COMPARISON:  MRI of the cervical spine performed 03/05/2014 FINDINGS: CT HEAD FINDINGS Brain: No evidence of acute infarction, hemorrhage, hydrocephalus, extra-axial collection or mass lesion/mass effect. The posterior fossa, including the cerebellum, brainstem and fourth ventricle, is within normal limits. The third and lateral ventricles, and basal ganglia are unremarkable in appearance. The cerebral hemispheres are symmetric in  appearance, with normal gray-white differentiation. No mass effect or midline shift is seen. Vascular: No hyperdense vessel or unexpected calcification. Skull: There is no evidence of fracture; visualized osseous structures are unremarkable in appearance. Sinuses/Orbits: The orbits are within normal limits. The paranasal sinuses and mastoid air cells are well-aerated. Other: No significant soft tissue abnormalities are seen. CT CERVICAL SPINE FINDINGS Alignment: There is minimal grade 1 anterolisthesis of C4 on C5. Skull base and vertebrae: No acute fracture. No primary bone lesion or focal pathologic process. There is incomplete fusion of the posterior arch of C1. Soft tissues and spinal canal: No prevertebral fluid or swelling. No visible canal hematoma. Disc levels:  Minimal disc space narrowing is noted at C5-C6. Facet disease is noted along the mid cervical spine. Upper chest: The visualized lung apices are clear. The thyroid gland is unremarkable. Other: No additional soft tissue abnormalities are seen. IMPRESSION: 1. No evidence of traumatic intracranial injury or fracture. 2. No evidence of fracture or subluxation along the cervical spine. 3. Minimal degenerative change along the mid cervical spine. Electronically Signed   By: Roanna Raider M.D.   On: 11/28/2016 23:35   Ct Cervical Spine Wo Contrast  Result Date: 11/28/2016 CLINICAL DATA:  Acute onset of dizziness, nausea and syncope. Initial encounter. EXAM: CT HEAD WITHOUT CONTRAST CT CERVICAL SPINE WITHOUT CONTRAST TECHNIQUE: Multidetector CT imaging of the head and cervical spine was performed following the standard protocol without intravenous contrast. Multiplanar CT image reconstructions of the cervical spine were also generated. COMPARISON:  MRI of the cervical spine performed 03/05/2014 FINDINGS: CT HEAD FINDINGS Brain: No evidence of acute infarction, hemorrhage, hydrocephalus, extra-axial collection or mass lesion/mass effect. The posterior  fossa, including the cerebellum, brainstem and fourth ventricle, is within normal limits. The third and lateral ventricles, and basal ganglia are unremarkable in appearance. The cerebral hemispheres are symmetric in appearance, with normal gray-white differentiation. No mass effect or midline shift is seen. Vascular: No hyperdense vessel or unexpected calcification. Skull: There is no evidence of fracture; visualized osseous structures are unremarkable in appearance. Sinuses/Orbits: The orbits are within normal limits. The paranasal sinuses and mastoid air cells are well-aerated. Other: No significant soft tissue abnormalities are seen. CT CERVICAL SPINE FINDINGS Alignment: There is minimal grade 1 anterolisthesis of C4 on C5. Skull base and vertebrae: No acute fracture. No primary bone lesion or focal pathologic process. There is incomplete fusion of the posterior arch of C1. Soft tissues and spinal canal: No prevertebral fluid or swelling. No visible canal hematoma. Disc levels: Minimal disc space narrowing is noted at C5-C6. Facet disease is noted along the mid cervical spine. Upper chest: The visualized lung apices are clear. The thyroid gland is unremarkable. Other: No additional soft tissue abnormalities are seen. IMPRESSION: 1. No evidence of traumatic intracranial injury or fracture. 2. No evidence of fracture or subluxation along the cervical spine. 3. Minimal degenerative change along the mid cervical spine. Electronically Signed   By: Roanna Raider M.D.   On: 11/28/2016 23:35    EKG: Independently reviewed. Vent. rate 47 BPM PR interval * ms QRS duration 101 ms QT/QTc 470/416 ms P-R-T axes 54 30 50 Sinus bradycardia Low voltage, precordial leads Since prior ECG, rate has slowed, no other significant changes  Assessment/Plan Principal Problem:   Syncope Observation/telemetry. Supplemental oxygen as needed. Continue IV fluids. Trend troponin levels. Hold antihypertensives. Check  carotid Dopplers and echocardiogram in a.m.  Active Problems:   Anxiety with depression Continue BuSpar 10 mg by mouth twice a day. Continue alprazolam as needed.    TOBACCO USER Encouraged to cease. Nicotine replacement therapy ordered. Tobacco cessation information to be provided before discharge.    Essential hypertension Hold lisinopril. Monitor blood pressure, renal function and electrolytes.    Herniated lumbar disc without myelopathy Continue morphine IR 15 mg by mouth every 4 hours as needed.    DVT (deep venous thrombosis) (HCC) Warfarin per pharmacy. Daily PT/INR    Chronic kidney disease, stage 3 Monitor renal function and electrolytes.    DVT prophylaxis: Lovenox SQ. Code Status: Full code. Family Communication:  Disposition Plan: Observation for syncope workup. Consults called:  Admission status: Observation/telemetry.  Bobette Moavid Manuel Ortiz MD [facility] Pager [phone].  If 7PM-7AM, please contact night-coverage [email or web address] Password [email or web address]  11/29/2016, 1:54 AM

## 2016-11-29 NOTE — Progress Notes (Signed)
  Echocardiogram 2D Echocardiogram has been performed.  Henley Boettner L Androw 11/29/2016, 1:10 PM

## 2016-11-29 NOTE — Progress Notes (Signed)
Discharge instructions (including medications) discussed with and copy provided to patient/caregiver along with prescription.

## 2016-11-29 NOTE — ED Notes (Signed)
Pt ambulatory to restroom

## 2016-11-29 NOTE — Progress Notes (Signed)
   11/29/16 1000  Clinical Encounter Type  Visited With Patient  Visit Type Initial  Referral From Nurse  Consult/Referral To Chaplain  Spiritual Encounters  Spiritual Needs Literature;Emotional  Stress Factors  Patient Stress Factors Health changes    Patient resting and didn't want to do ADHCPOA at this time. Contact on call chaplain if patient determines she is ready. Provided ministry of presence and emotional support. Nike Southers L. Salomon FickBanks, MDiv

## 2016-12-02 ENCOUNTER — Telehealth: Payer: Self-pay

## 2016-12-02 NOTE — Telephone Encounter (Signed)
Pt calls and states she was only given 21 of the msir at the pharm and will want the other 9 when she comes for her appt Concerned about her BP being low, she has chosen to continue to hold her bp med and is keeping a log of her bp readings, she is ask to bring it with her She states she had 1 brief moment of dizziness and weakness, she is informed to call 911 if she has another incident, she is agreeable

## 2016-12-02 NOTE — Telephone Encounter (Signed)
Needs to speak with a nurse about morphine (MSIR) 30 MG tablet. Please call pt back.

## 2016-12-06 ENCOUNTER — Ambulatory Visit: Payer: Medicaid Other

## 2016-12-08 ENCOUNTER — Ambulatory Visit (INDEPENDENT_AMBULATORY_CARE_PROVIDER_SITE_OTHER): Payer: Medicaid Other

## 2016-12-09 ENCOUNTER — Ambulatory Visit (INDEPENDENT_AMBULATORY_CARE_PROVIDER_SITE_OTHER): Payer: Medicaid Other | Admitting: Internal Medicine

## 2016-12-09 ENCOUNTER — Encounter: Payer: Self-pay | Admitting: Internal Medicine

## 2016-12-09 VITALS — BP 107/61 | HR 61 | Temp 98.6°F | Ht 65.0 in | Wt 197.0 lb

## 2016-12-09 DIAGNOSIS — M5126 Other intervertebral disc displacement, lumbar region: Secondary | ICD-10-CM

## 2016-12-09 DIAGNOSIS — I129 Hypertensive chronic kidney disease with stage 1 through stage 4 chronic kidney disease, or unspecified chronic kidney disease: Secondary | ICD-10-CM

## 2016-12-09 DIAGNOSIS — H9313 Tinnitus, bilateral: Secondary | ICD-10-CM

## 2016-12-09 DIAGNOSIS — I82509 Chronic embolism and thrombosis of unspecified deep veins of unspecified lower extremity: Secondary | ICD-10-CM | POA: Diagnosis not present

## 2016-12-09 DIAGNOSIS — I82402 Acute embolism and thrombosis of unspecified deep veins of left lower extremity: Secondary | ICD-10-CM

## 2016-12-09 DIAGNOSIS — R42 Dizziness and giddiness: Secondary | ICD-10-CM | POA: Insufficient documentation

## 2016-12-09 DIAGNOSIS — F17210 Nicotine dependence, cigarettes, uncomplicated: Secondary | ICD-10-CM

## 2016-12-09 DIAGNOSIS — N189 Chronic kidney disease, unspecified: Secondary | ICD-10-CM

## 2016-12-09 DIAGNOSIS — Z79891 Long term (current) use of opiate analgesic: Secondary | ICD-10-CM | POA: Diagnosis not present

## 2016-12-09 DIAGNOSIS — I10 Essential (primary) hypertension: Secondary | ICD-10-CM

## 2016-12-09 DIAGNOSIS — Z7901 Long term (current) use of anticoagulants: Secondary | ICD-10-CM | POA: Diagnosis not present

## 2016-12-09 DIAGNOSIS — Z79899 Other long term (current) drug therapy: Secondary | ICD-10-CM | POA: Diagnosis not present

## 2016-12-09 DIAGNOSIS — Z5189 Encounter for other specified aftercare: Secondary | ICD-10-CM | POA: Diagnosis not present

## 2016-12-09 LAB — POCT INR: INR: 2.1

## 2016-12-09 MED ORDER — MORPHINE SULFATE 15 MG PO TABS: 15.0000 mg | ORAL_TABLET | Freq: Three times a day (TID) | ORAL | 0 refills | Status: DC | PRN

## 2016-12-09 MED ORDER — RIVAROXABAN 15 MG PO TABS: 15.0000 mg | ORAL_TABLET | Freq: Two times a day (BID) | ORAL | 0 refills | Status: DC

## 2016-12-09 MED ORDER — RIVAROXABAN 20 MG PO TABS: 20.0000 mg | ORAL_TABLET | Freq: Every day | ORAL | 1 refills | Status: DC

## 2016-12-09 NOTE — Patient Instructions (Addendum)
Marie Willis it was nice meeting you today.  -STOP taking Coumadin  -START taking Xarelto 15 mg twice daily for 21 days. Then, take Xarelto 20 mg once daily with supper.   -I have referred you to ENT for the problem in your ears.  - I am giving you a new prescription for morphine.  -You can hold off from taking lisinopril at this time. If the upper number in your blood pressure is >130 or the lower number is >80, you can start taking lisinopril again.

## 2016-12-10 NOTE — Progress Notes (Signed)
   CC: Patient is here for a hospital follow-up. Vertigo, back pain, hypertension, and history of DVT were discussed during this visit.  HPI:  [name] is a 61 y.o. female with a past medical history of conditions listed below presenting to the clinic for a hospital follow-up. Vertigo, back pain, hypertension, and history of DVT were discussed during this visit. Please see problem based charting for the status of the patient's current and chronic medical conditions.   Past Medical History:  Diagnosis Date  . Anxiety   . Chronic kidney disease, stage 3 06/27/2014  . Chronic pain syndrome 01/02/2014  . Depression   . DVT (deep venous thrombosis) (HCC) 01/12/2011   DVT noted in R peroneal vein 01/11/11, started on lovenox & coumadin-1st episode. Treated for approx 6 mo. Reoccurrence 04/2013, on life-long Coumadin now.   . 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 Qualifier: Diagnosis of  By: Eben Burow MD, Ankit     . Hypertension   . Low back pain    Review of Systems: Pertinent positives mentioned in HPI. Remainder of all ROS negative.   Physical Exam:  Vitals:   12/09/16 0850  BP: 107/61  Pulse: 61  Temp: 98.6 F (37 C)  TempSrc: Oral  SpO2: 100%  Weight: 197 lb (89.4 kg)  Height: 5\' 5"  (1.651 m)   Physical Exam  Constitutional: She is oriented to person, place, and time. She appears well-developed and well-nourished. No distress.  HENT:  Head: Normocephalic and atraumatic.  Eyes: Right eye exhibits no discharge. Left eye exhibits no discharge.  Cardiovascular: Normal rate, regular rhythm and intact distal pulses.   Pulmonary/Chest: Effort normal and breath sounds normal. No respiratory distress. She has no wheezes. She has no rales.  Abdominal: Soft. Bowel sounds are normal. She exhibits no distension. There is no tenderness.  Musculoskeletal: She exhibits no edema.    Neurological: She is alert and oriented to person, place, and time.  Skin: Skin is warm and dry.  Psychiatric:  Sitting up on the examination table with her legs crossed filing her nails.    Assessment & Plan:   See Encounters Tab for problem based charting.  Patient discussed with Dr. Criselda Peaches
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
## 2016-12-10 NOTE — Assessment & Plan Note (Addendum)
History of present illness/ assessment Lisinopril was resumed on her hospital discharge, however, patient has not been taking this medication as her blood pressure readings were good at home. Blood pressure 107/61 at this visit. Although lisinopril is beneficial for its renoprotective effect in the setting of CKD, patient was recently admitted for syncope, as such, caution should be used with blood pressure lowering medications.  Plan -Advised her to resume lisinopril if her systolic is above 130 or diastolic above 80.

## 2016-12-10 NOTE — Assessment & Plan Note (Signed)
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 She is currently on warfarin but interested in switching to Xarelto because of the dietary restrictions associated with warfarin use. INR 2.1 at this visit. Based on recent labs, her creatinine clearance is 53 and it would be safe for her to take Xarelto.   Plan -Stop warfarin -Start Xarelto 15 mg twice daily for 21 days, then 20 mg daily

## 2016-12-10 NOTE — Assessment & Plan Note (Signed)
History of present illness/ assessment Patient is on chronic opiates (morphine IR 30 mg daily) for her lumbar back pain. She has a history of herniated lumbar disc without myelopathy. Patient is interested in getting off opioids. Upon hospital discharge, she received a prescription for morphine IR 30 mg one half tablet every 4 hours as needed. Per review of [place] controlled substance database, the prescription was filled on 11/29/2016 for 21 tablets. Plan is to do a monthly taper.  Plan -Gave her a prescription for morphine IR 15 mg every 8 hours as needed #90 -Continue tapering at future visit

## 2016-12-15 NOTE — Progress Notes (Signed)
Internal Medicine Clinic Attending  Case discussed with Dr. Rathoreat the time of the visit. We reviewed the resident's history and exam and pertinent patient test results. I agree with the assessment, diagnosis, and plan of care documented in the resident's note.  

## 2016-12-15 NOTE — Telephone Encounter (Signed)
Came to [facility], no complaints

## 2016-12-29 ENCOUNTER — Ambulatory Visit (INDEPENDENT_AMBULATORY_CARE_PROVIDER_SITE_OTHER): Payer: Medicaid Other | Admitting: Internal Medicine

## 2016-12-29 ENCOUNTER — Encounter: Payer: Self-pay | Admitting: Internal Medicine

## 2016-12-29 VITALS — BP 133/71 | HR 59 | Temp 98.1°F | Ht 66.0 in | Wt 193.2 lb

## 2016-12-29 DIAGNOSIS — G894 Chronic pain syndrome: Secondary | ICD-10-CM | POA: Diagnosis present

## 2016-12-29 DIAGNOSIS — I129 Hypertensive chronic kidney disease with stage 1 through stage 4 chronic kidney disease, or unspecified chronic kidney disease: Secondary | ICD-10-CM | POA: Diagnosis not present

## 2016-12-29 DIAGNOSIS — F17210 Nicotine dependence, cigarettes, uncomplicated: Secondary | ICD-10-CM | POA: Diagnosis not present

## 2016-12-29 DIAGNOSIS — M79642 Pain in left hand: Secondary | ICD-10-CM

## 2016-12-29 DIAGNOSIS — N183 Chronic kidney disease, stage 3 (moderate): Secondary | ICD-10-CM

## 2016-12-29 DIAGNOSIS — M79641 Pain in right hand: Secondary | ICD-10-CM

## 2016-12-29 NOTE — Progress Notes (Signed)
CC: bilateral hand numbness  HPI:    [name] is a 61 y.o. Pt here to address her bilateral hand numbness that hurts at night feels like her leg goes to sleep and would like a referral to a hand specialist.  She was referred to sports medicine in the past and was found to have some cervical spine disease: 1.  Grade 1 degenerative anterolisthesis of C4-5 with mild left foraminal narrowing .2.  Mild left foraminal narrowing at C2-3. 3.  Mild biforaminal narrowing at C3-4.  4.  Mild left foraminal stenosis at C6-7.  Sports medicine gave her a pressure relieving brace that was not very helpful.    Please see A&P for status of the patient's chronic medical conditions  Past Medical History:  Diagnosis Date  . Anxiety   . Chronic kidney disease, stage 3 06/27/2014  . Chronic pain syndrome 01/02/2014  . Depression   . DVT (deep venous thrombosis) (HCC) 01/12/2011   DVT noted in R peroneal vein 01/11/11, started on lovenox & coumadin-1st episode. Treated for approx 6 mo. Reoccurrence 04/2013, on life-long Coumadin now.   . 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 Qualifier: Diagnosis of  By: Eben Burow MD, Ankit     . Hypertension   . Low back pain    Review of Systems:  ROS: Pulmonary: pt denies increased work of breathing, shortness of breath,  Cardiac: pt denies palpitations, chest pain,  Abdominal: pt denies abdominal pain, nausea, vomiting, or diarrhea  Physical Exam: Physical Exam  Constitutional: She is oriented to person, place, and time. She appears well-developed and well-nourished.  HENT:  Head: Normocephalic and atraumatic.  Eyes: Pupils are equal, round, and reactive to light. EOM are normal. Right eye exhibits no discharge. Left eye exhibits no discharge. No scleral icterus.  Neck: No JVD present. No thyromegaly present.  Cardiovascular: Normal rate, regular rhythm and  normal heart sounds.  Exam reveals no gallop and no friction rub.   No murmur heard. Pulmonary/Chest: Effort normal and breath sounds normal. No stridor. No respiratory distress.  Musculoskeletal:       Arms: Patient reports numbness and pain in bilateral hands in a stocking formation. She has normal range of motion and intact sensation in bilateral upper extremities and hands. She has normal strength in both hands and in her biceps and triceps. She did have tenderness to palpation in her cervical spine at around the level of C6-C7  Neurological: She is alert and oriented to person, place, and time.  Skin: Skin is warm and dry.  Psychiatric: Her mood appears anxious. Her speech is rapid and/or pressured. She is aggressive. She expresses impulsivity.   Vitals:   12/29/16 1500  BP: 133/71  Pulse: (!) 59  Temp: 98.1 F (36.7 C)  TempSrc: Oral  SpO2: 98%  Weight: 193 lb 3.2 oz (87.6 kg)  Height: 5\' 6"  (1.676 m)     Social History   Social History  . Marital status: Married    Spouse name: N/A  . Number of children: N/A  . Years of education: N/A   Occupational History  . former hairdresser Unemployed   Social History Main Topics  . Smoking status: Current Every Day Smoker    Packs/day: 0.50    Types: Cigarettes  . Smokeless tobacco: Never Used     Comment: Stress lately.  Waiting to do with husband  . Alcohol use No  .  Drug use: No  . Sexual activity: Not on file   Other Topics Concern  . Not on file   Social History Narrative   Former Interior and spatial designerhairdresser, currently unemployed. Lives at home with husband Marie Willis and 3 dogs.      Financial assistance approved by Rudell Cobbeborah Hill [facility] and [facility] Nov [facility] 2011.    Family History  Problem Relation Age of Onset  . Adopted: Yes    Assessment & Plan:   See Encounters Tab for problem based charting.  Patient seen with Dr. Heide SparkNarendra

## 2016-12-29 NOTE — Assessment & Plan Note (Signed)
Physical exam is suggestive of a more radicular pain. She was referred to sports medicine number of years ago and was given a brace with very little improvement. She has prior imaging demonstrating foraminal and cervical spinal narrowing. I have placed a referral to neurology for nerve conduction studies to further evaluate the cause of the patient's pain and numbness the patient is agreeable to this.

## 2016-12-29 NOTE — Patient Instructions (Signed)
Please follow up with neurology about your bilateral hand pain

## 2016-12-29 NOTE — Assessment & Plan Note (Signed)
Patient was treated here for chronic pain in the past but broke her pain contract 2 years ago. She was recently seen here in the clinic and started back on morphine however. We discussed with the patient on tapering her off of the morphine and she was agreeable to this. We repeated a urine drug screen today to check for the presence of benzodiazepines. However she was reluctant to give an adequate sample and in a hurry to catch her ride I'm unsure if the sample will be able to accurately assess this. She cites the majority of her pain is in her lower back.

## 2016-12-31 NOTE — Progress Notes (Signed)
Internal Medicine Clinic Attending  I saw and evaluated the patient.  I personally confirmed the key portions of the history and exam documented by Dr. Frances FurbishWinfrey and I reviewed pertinent patient test results.  The assessment, diagnosis, and plan were formulated together and I agree with the documentation in the resident's note.  I am concerned that she did not give a urine sample prior to leaving. This is a red flag and if she refuses again we should stop prescribing opiates. I also think we need to taper her off morphine at 10% per month till she is off morphine completely irrespective of her tox screen. We discussed the importance of tapering this with the patient.

## 2016-12-31 NOTE — Addendum Note (Signed)
Addended by: Earl LagosNARENDRA, Raeann Offner on: 12/31/2016 10:47 AM   Modules accepted: Level of Service

## 2017-01-04 LAB — TOXASSURE SELECT,+ANTIDEPR,UR

## 2017-01-10 ENCOUNTER — Encounter: Payer: Self-pay | Admitting: *Deleted

## 2017-01-11 ENCOUNTER — Other Ambulatory Visit: Payer: Self-pay

## 2017-01-11 NOTE — Telephone Encounter (Signed)
morphine (MSIR) 15 MG tablet, refill request. Pt is out of med.

## 2017-01-12 ENCOUNTER — Other Ambulatory Visit: Payer: Self-pay | Admitting: *Deleted

## 2017-01-12 MED ORDER — MORPHINE SULFATE 15 MG PO TABS: 15.0000 mg | ORAL_TABLET | Freq: Three times a day (TID) | ORAL | 0 refills | Status: DC | PRN

## 2017-01-12 NOTE — Telephone Encounter (Signed)
Picked up rx today,  last rx was filled last Aug per patient. Appt w/ pcp 10.15.18

## 2017-01-17 NOTE — Telephone Encounter (Signed)
This was answered 9/19

## 2017-01-26 ENCOUNTER — Other Ambulatory Visit: Payer: Self-pay | Admitting: Internal Medicine

## 2017-01-31 NOTE — Addendum Note (Signed)
Addended by: Neomia Dear on: 01/31/2017 06:40 PM   Modules accepted: Orders

## 2017-02-07 ENCOUNTER — Ambulatory Visit (INDEPENDENT_AMBULATORY_CARE_PROVIDER_SITE_OTHER): Payer: Medicaid Other | Admitting: Internal Medicine

## 2017-02-07 ENCOUNTER — Encounter: Payer: Self-pay | Admitting: Internal Medicine

## 2017-02-07 VITALS — BP 139/82 | HR 70 | Temp 98.7°F | Ht 66.0 in | Wt 192.3 lb

## 2017-02-07 DIAGNOSIS — Z79899 Other long term (current) drug therapy: Secondary | ICD-10-CM | POA: Diagnosis not present

## 2017-02-07 DIAGNOSIS — F419 Anxiety disorder, unspecified: Secondary | ICD-10-CM

## 2017-02-07 DIAGNOSIS — M25511 Pain in right shoulder: Secondary | ICD-10-CM

## 2017-02-07 DIAGNOSIS — Z0000 Encounter for general adult medical examination without abnormal findings: Secondary | ICD-10-CM

## 2017-02-07 DIAGNOSIS — G894 Chronic pain syndrome: Secondary | ICD-10-CM | POA: Diagnosis not present

## 2017-02-07 DIAGNOSIS — M5412 Radiculopathy, cervical region: Secondary | ICD-10-CM | POA: Diagnosis not present

## 2017-02-07 DIAGNOSIS — F17210 Nicotine dependence, cigarettes, uncomplicated: Secondary | ICD-10-CM | POA: Diagnosis not present

## 2017-02-07 DIAGNOSIS — Z79891 Long term (current) use of opiate analgesic: Secondary | ICD-10-CM

## 2017-02-07 DIAGNOSIS — R197 Diarrhea, unspecified: Secondary | ICD-10-CM | POA: Diagnosis present

## 2017-02-07 DIAGNOSIS — R159 Full incontinence of feces: Secondary | ICD-10-CM

## 2017-02-07 DIAGNOSIS — Z23 Encounter for immunization: Secondary | ICD-10-CM

## 2017-02-07 DIAGNOSIS — I10 Essential (primary) hypertension: Secondary | ICD-10-CM | POA: Diagnosis not present

## 2017-02-07 MED ORDER — DICLOFENAC SODIUM 1 % TD GEL: 4.0000 g | Freq: Four times a day (QID) | TRANSDERMAL | 2 refills | Status: AC | PRN

## 2017-02-07 NOTE — Progress Notes (Signed)
   CC: Diarrhea, R shoulder pain, follow up on neck pain and finger numbness,  HTN   HPI:  [name] is a 61 y.o. female who comes into clinic today to discuss some intermittent diarrhea she has been having.  She also reports some new right shoulder pain.  During our last visit we discussed the patient's cervical radiculopathy resulting in numbness in her fingers.    Please see A&P for status of the patient's chronic medical conditions  Past Medical History:  Diagnosis Date  . Anxiety   . Chronic kidney disease, stage 3 (HCC) 06/27/2014  . Chronic pain syndrome 01/02/2014  . Depression   . DVT (deep venous thrombosis) (HCC) 01/12/2011   DVT noted in R peroneal vein 01/11/11, started on lovenox & coumadin-1st episode. Treated for approx 6 mo. Reoccurrence 04/2013, on life-long Coumadin now.   . 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 Qualifier: Diagnosis of  By: Eben Burow MD, Ankit     . Hypertension   . Low back pain    Review of Systems:  ROS: Pulmonary: pt denies increased work of breathing, shortness of breath,  Cardiac: pt denies palpitations, chest pain,  Abdominal: pt denies abdominal pain, nausea, vomiting, does endorse intermittent diarrhea.  Physical Exam:  Vitals:   02/07/17 1539  BP: 139/82  Pulse: 70  Temp: 98.7 F (37.1 C)  TempSrc: Oral  SpO2: 100%  Weight: 192 lb 4.8 oz (87.2 kg)  Height:  (1.676 m)   Physical Exam  Constitutional: She is oriented to person, place, and time. No distress.  Cardiovascular: Normal rate, regular rhythm and normal heart sounds.  Exam reveals no gallop and no friction rub.   No murmur heard. Pulmonary/Chest: Effort normal and breath sounds normal. No respiratory distress. She has no wheezes. She has no rales. She exhibits no tenderness.  Abdominal: Soft. Bowel sounds are normal. She exhibits no distension and no mass.  There is no tenderness. There is no rebound and no guarding.  Musculoskeletal:  Empty can positive on right shoulder  Neurological: She is alert and oriented to person, place, and time.  Skin: She is not diaphoretic.    Social History   Social History  . Marital status: Married    Spouse name: N/A  . Number of children: N/A  . Years of education: N/A   Occupational History  . former hairdresser Unemployed   Social History Main Topics  . Smoking status: Current Every Day Smoker    Packs/day: 0.50    Types: Cigarettes  . Smokeless tobacco: Never Used     Comment: Stress lately.  Waiting to do with husband  . Alcohol use No  . Drug use: No  . Sexual activity: Not on file   Other Topics Concern  . Not on file   Social History Narrative   Former Interior and spatial designer, currently unemployed. Lives at home with husband Marie Willis and 3 dogs.      Financial assistance approved by Rudell Cobb for 100 discount at [facility] and has [facility] card, Nov 4th 2011.    Family History  Problem Relation Age of Onset  . Adopted: Yes    Assessment & Plan:   See Encounters Tab for problem based charting.  Patient seen with Dr. Rogelia Boga

## 2017-02-07 NOTE — Patient Instructions (Addendum)
We have ordered some tests to help evaluate the cause of the intermittent diarrhea you've been having.  Please stop wearing your large purse on your shoulder and try and use the voltaren gel to help with your pain.  If you notice that you still have increased pain and weakness in that shoulder you can come back for re evaluation, otherwise we will reevaluate this at your visit in one month.

## 2017-02-08 LAB — TSH: TSH: 1.74 u[IU]/mL (ref 0.450–4.500)

## 2017-02-08 NOTE — Assessment & Plan Note (Signed)
Pt reports that she has been carrying a new purse on her right shoulder that is very large.  She feels like she has pain exacerbated by movement and feels a clicking sound in it.  I did not appreciate crepitus on my exam however, pt was empty can positive.    -Encouraged pt to switch purses  -Refilled pts voltaren gel, ice/heat, will continue this conservative management for now as I suspect this is a tendonitis of the supraspinatus tendon and re evaluate at next visit or earlier if no improvement.

## 2017-02-08 NOTE — Assessment & Plan Note (Signed)
Pt agreed to the flu shot today and also a FOBT immunochemical test, she refused colonoscopy.

## 2017-02-08 NOTE — Assessment & Plan Note (Signed)
BP Readings from Last 3 Encounters:  02/07/17 139/82  12/29/16 133/71  12/09/16 107/61  Pts blood pressure within goal today.  She mentions she rushed in to get checked in because she was late for her appointment and that it may be higher than it actually is.    -continue lisinopril 

## 2017-02-08 NOTE — Assessment & Plan Note (Signed)
Pt said this diarrhea began 4-6 weeks ago, about the time her buspar was increased to 3 times daily from 2 times daily by her psychiatrist.  She is reluctant to stop the buspar due to her intense anxiety.  She reports when she has an episode it often comes without warning.  She has additionally noticed some seepage.  On one occasion she recalls sitting on the couch and all of the sudden she went in her pants and had to go and get cleaned up.  Patient does use well water but no one else in the household has noticed any diarrhea.  Pt usually has constipation and defecates about once a week.    -TSH -GI pathogen panel -FOBT Immunochemical test for colon cancer screening.   -Will continue to follow up on this issue

## 2017-02-08 NOTE — Assessment & Plan Note (Signed)
Pt was extremely anxious today.  We discussed possibly tapering her morphine today but she was extremely opposed to this idea.  She feels like with the new shoulder pain on top of her existing pain it would be a poor time to taper.

## 2017-02-09 ENCOUNTER — Other Ambulatory Visit: Payer: Self-pay | Admitting: Internal Medicine

## 2017-02-09 NOTE — Addendum Note (Signed)
Addended by: Neomia DearPOWERS, Jones Viviani E on: 02/09/2017 04:59 PM   Modules accepted: Orders

## 2017-02-09 NOTE — Progress Notes (Incomplete)
Internal Medicine Clinic Attending  I saw and evaluated the patient.  I personally confirmed the key portions of the history and exam documented by Dr. Frances FurbishWinfrey and I reviewed pertinent patient test results.  The assessment, diagnosis, and plan were formulated together and I agree with the documentation in the resident's note. There are many reasons for her intermittent fecal incontinence. She normally only has one stool a week but will take miralax if she feels "backede up" or bloated. Her incontinence is indep of the laxative. She could have constipation with stool seepage (bowel regimen). Giardia is possible as drinks well water (check stool panel). A colon mass with stool leakage is possible (doesn't want colonoscopy - start with stool fecal cards). Hyperthyroidism is possible (check TSH).

## 2017-02-09 NOTE — Progress Notes (Signed)
Internal Medicine Clinic Attending  I saw and evaluated the patient.  I personally confirmed the key portions of the history and exam documented by Dr. Frances FurbishWinfrey and I reviewed pertinent patient test results.  The assessment, diagnosis, and plan were formulated together and I agree with the documentation in the resident's note. There are many reasons for her intermittent fecal incontinence. She normally only has one stool a week but will take miralax if she feels "backede up" or bloated. Her incontinence is indep of the laxative. She could have constipation with stool seepage (bowel regimen). Giardia is possible as drinks well water (check stool panel). A colon mass with stool leakage is possible (doesn't want colonoscopy - start with stool fecal cards). Hyperthyroidism is possible (check TSH). Other possibilities - Celiac, microscopic colitis, atypical IBD, SIBO - would need endoscopy if sxs cont.

## 2017-02-09 NOTE — Telephone Encounter (Signed)
Refill Request   Morphine (MSIR)

## 2017-02-10 ENCOUNTER — Encounter: Payer: Self-pay | Admitting: Internal Medicine

## 2017-02-10 ENCOUNTER — Other Ambulatory Visit: Payer: Self-pay | Admitting: *Deleted

## 2017-02-10 DIAGNOSIS — G894 Chronic pain syndrome: Secondary | ICD-10-CM

## 2017-02-10 MED ORDER — MORPHINE SULFATE 15 MG PO TABS: 15.0000 mg | ORAL_TABLET | Freq: Three times a day (TID) | ORAL | 0 refills | Status: DC | PRN

## 2017-02-10 NOTE — Telephone Encounter (Signed)
MSIR refilled per Dr Frances FurbishWinfrey - pt was called, left message.

## 2017-02-10 NOTE — Telephone Encounter (Signed)
Rx ready for pick-up; pt called, no answer, left message. 

## 2017-02-11 ENCOUNTER — Telehealth: Payer: Self-pay | Admitting: *Deleted

## 2017-02-11 NOTE — Telephone Encounter (Signed)
Empty note, closing

## 2017-02-11 NOTE — Telephone Encounter (Signed)
Pt calls very upset that PA is needed on mophine, explained medicaid is requiring this more and more and it will be addressed asap

## 2017-02-14 NOTE — Telephone Encounter (Signed)
Prior Authorization request form completed and faxed (under DrWinfrey's NPI) to Medicaid for review.  May take up to 48 hours for a decision.Kingsley SpittleGoldston, Darlene Cassady10/22/201810:01 AM  Medicaid PA Call Center: [phone] Fax # [phone] Pt Medicaid ID: [id number] L

## 2017-03-10 ENCOUNTER — Other Ambulatory Visit: Payer: Self-pay | Admitting: Internal Medicine

## 2017-03-10 DIAGNOSIS — G894 Chronic pain syndrome: Secondary | ICD-10-CM

## 2017-03-10 MED ORDER — MORPHINE SULFATE 15 MG PO TABS: 15.0000 mg | ORAL_TABLET | Freq: Three times a day (TID) | ORAL | 0 refills | Status: DC | PRN

## 2017-03-10 NOTE — Telephone Encounter (Signed)
Rx ready for p/u, LM on vmail.

## 2017-03-10 NOTE — Telephone Encounter (Signed)
Needs refill, msir 15mg  count 90

## 2017-03-11 ENCOUNTER — Telehealth: Payer: Self-pay | Admitting: *Deleted

## 2017-03-11 NOTE — Telephone Encounter (Signed)
rec'd a call from pt, she is upset because medicaid is rejecting her msir, sent to [facility]

## 2017-03-11 NOTE — Telephone Encounter (Signed)
Received notice that patient needs a PA on her "pain medication".  I contacted medicaid and was informed that PA had been denied by medicaid as "based on the information received patient does not meet criteria for his medication".  Medicaid also stated that patient should have received a letter in the mail about the denial.  Message left on pt's recorder.Kingsley SpittleGoldston, Darlene Cassady11/16/20185:06 PM     Notes below copied from previous note February 14, 2017  Me   10:00 AM  Note    Prior Authorization request form completed and faxed (under DrWinfrey's NPI) to Medicaid for review.  May take up to 48 hours for a decision.Kingsley SpittleGoldston, Darlene Cassady10/22/201810:01 AM  Medicaid PA Call Center: [phone] Fax # [phone] Pt Medicaid ID: [id number] L

## 2017-03-25 ENCOUNTER — Other Ambulatory Visit: Payer: Self-pay | Admitting: Internal Medicine

## 2017-03-28 ENCOUNTER — Other Ambulatory Visit: Payer: Self-pay | Admitting: *Deleted

## 2017-03-28 DIAGNOSIS — M25561 Pain in right knee: Principal | ICD-10-CM

## 2017-03-28 DIAGNOSIS — G8929 Other chronic pain: Secondary | ICD-10-CM

## 2017-03-28 MED ORDER — MELOXICAM 7.5 MG PO TABS: 7.5000 mg | ORAL_TABLET | Freq: Every day | ORAL | 0 refills | Status: AC

## 2017-03-28 MED ORDER — LISINOPRIL 20 MG PO TABS: ORAL_TABLET | ORAL | 1 refills | Status: DC

## 2017-03-28 NOTE — Telephone Encounter (Signed)
I will refill the lisinopril and write for a short refill on the mobic.  I would prefer to try some other options given her kidney disease.  If she is continuing to have knee pain, it would be [facility].

## 2017-03-28 NOTE — Telephone Encounter (Signed)
Pt states BP is up to 110/62.

## 2017-05-04 ENCOUNTER — Telehealth: Payer: Self-pay

## 2017-05-04 NOTE — Telephone Encounter (Signed)
Requesting PA on   morphine (MSIR) 15 MG tablet.   Would like needs to speak with a nurse about pain med. Please call pt back.

## 2017-05-04 NOTE — Telephone Encounter (Signed)
rtc to pt, lm for rtc, sending to gladys for PA

## 2017-05-06 ENCOUNTER — Telehealth: Payer: Self-pay | Admitting: Internal Medicine

## 2017-05-06 NOTE — Telephone Encounter (Signed)
[facility]  [facility].  [facility]  Angelina OkGladys Zaine Elsass, RN 05/06/2017 5:36 PM

## 2017-05-06 NOTE — Telephone Encounter (Signed)
Spoke w/ dr Frances Furbishwinfrey, will call and give verbal to fill 1/15

## 2017-05-06 NOTE — Telephone Encounter (Signed)
Patient is calling back regarding medicine refills

## 2017-05-06 NOTE — Telephone Encounter (Signed)
Pt calls and states she wants to make sure a PA has been done for her morphine and she wants it filled 1/15 because that will be 30 days- she states there were 31 days in December so she shouldn't have to wait until the 16th, triage will call pharm if you want the date changed, please advise

## 2017-05-09 ENCOUNTER — Other Ambulatory Visit: Payer: Self-pay | Admitting: Internal Medicine

## 2017-05-09 NOTE — Telephone Encounter (Signed)
Return call made to patient -wanted to know if rx can be filled on 01/15 (which has already been authorized by Dr Frances FurbishWinfrey).  Pt states she has been paying about $55 for the last 6 months for her morphine rx as medicaid has denied coverage.  Pt given Good Rx info and will attempt to use at pharmacy for a discount.Criss Alvine.Malaia Buchta Cassady1/14/20194:28 PM

## 2017-05-09 NOTE — Telephone Encounter (Signed)
Patient is calling about medicine, pls call patient

## 2017-06-06 ENCOUNTER — Other Ambulatory Visit: Payer: Self-pay | Admitting: Internal Medicine

## 2017-06-06 DIAGNOSIS — G894 Chronic pain syndrome: Secondary | ICD-10-CM

## 2017-06-06 NOTE — Telephone Encounter (Signed)
Needs refill on MSIR 15 MG, WILL PICK UP THURSDAY

## 2017-06-07 ENCOUNTER — Telehealth: Payer: Self-pay | Admitting: *Deleted

## 2017-06-07 MED ORDER — MORPHINE SULFATE 15 MG PO TABS: 15.0000 mg | ORAL_TABLET | Freq: Three times a day (TID) | ORAL | 0 refills | Status: DC | PRN

## 2017-06-07 NOTE — Telephone Encounter (Signed)
Spoke w/ dr Frances Furbishwinfrey and may fill pain med at exactly 30 days, 2/14. Informed pt. Stressed to pt if she does not come to next appt, narcotics will be stopped, taper will start at the 3/11 appt

## 2017-06-07 NOTE — Telephone Encounter (Signed)
Refilled but can pick up on the 15th not earlier, I put a note into the prescription

## 2017-06-07 NOTE — Telephone Encounter (Signed)
Called pt - no answer; left message "That is why I wrote it for the 15th and not the 16th" per Dr Frances FurbishWinfrey.

## 2017-06-07 NOTE — Telephone Encounter (Signed)
That is why I wrote it for the 15th and not the 16th, tell the patient to try counting again.  She continues to take more than prescribed and I got a report she has been taking her husband's opiates as well.

## 2017-06-07 NOTE — Telephone Encounter (Signed)
Last rx written 05/11/17. Last OV 02/07/17. Next OV  07/04/17 with PCP. UDS 12/29/16.

## 2017-06-07 NOTE — Telephone Encounter (Signed)
Pt informed rx sent electronically and do not fill until 2/15. Pt states 31 days in January so she will out Thursday morning. Thanks

## 2017-07-04 ENCOUNTER — Other Ambulatory Visit: Payer: Self-pay

## 2017-07-04 ENCOUNTER — Encounter: Payer: Self-pay | Admitting: Internal Medicine

## 2017-07-04 ENCOUNTER — Ambulatory Visit (INDEPENDENT_AMBULATORY_CARE_PROVIDER_SITE_OTHER): Payer: Medicaid Other | Admitting: Internal Medicine

## 2017-07-04 VITALS — BP 127/69 | HR 66 | Temp 98.2°F | Ht 66.0 in | Wt 206.0 lb

## 2017-07-04 DIAGNOSIS — N183 Chronic kidney disease, stage 3 unspecified: Secondary | ICD-10-CM

## 2017-07-04 DIAGNOSIS — I129 Hypertensive chronic kidney disease with stage 1 through stage 4 chronic kidney disease, or unspecified chronic kidney disease: Secondary | ICD-10-CM | POA: Diagnosis not present

## 2017-07-04 DIAGNOSIS — G894 Chronic pain syndrome: Secondary | ICD-10-CM | POA: Diagnosis not present

## 2017-07-04 DIAGNOSIS — Z560 Unemployment, unspecified: Secondary | ICD-10-CM | POA: Diagnosis not present

## 2017-07-04 DIAGNOSIS — E785 Hyperlipidemia, unspecified: Secondary | ICD-10-CM | POA: Diagnosis not present

## 2017-07-04 DIAGNOSIS — M4802 Spinal stenosis, cervical region: Secondary | ICD-10-CM

## 2017-07-04 DIAGNOSIS — F17210 Nicotine dependence, cigarettes, uncomplicated: Secondary | ICD-10-CM | POA: Diagnosis not present

## 2017-07-04 DIAGNOSIS — I10 Essential (primary) hypertension: Secondary | ICD-10-CM | POA: Diagnosis not present

## 2017-07-04 DIAGNOSIS — Z0000 Encounter for general adult medical examination without abnormal findings: Secondary | ICD-10-CM

## 2017-07-04 MED ORDER — ATORVASTATIN CALCIUM 40 MG PO TABS: 40.0000 mg | ORAL_TABLET | Freq: Every day | ORAL | 2 refills | Status: DC

## 2017-07-04 NOTE — Assessment & Plan Note (Addendum)
Lab Results  Component Value Date   CHOL 183 01/02/2014   HDL 32 (L) 01/02/2014   LDLCALC 102 (H) 01/02/2014   TRIG 246 (H) 01/02/2014   CHOLHDL 5.7 01/02/2014   ASCVD risk currently is 9.3%,currently she is not on statin therapy.  -will prescribe lipitor 40mg  daily -will check lipid panel

## 2017-07-04 NOTE — Progress Notes (Signed)
CC: neck pain, follow up on CKD [name], Hypertension, Health maintenance  HPI:  [name] is a 62 y.o. female with PMH below comes to clinic to address her continued neck pain, HTN, CKD [name], and address issues related to her health maintenance.  We discussed her smoking as well, she continues to smoke one pack per day and is uninterested in trying to quit at this time.    Please see A&P for status of the patient's chronic medical conditions  Past Medical History:  Diagnosis Date  . Anxiety   . Chronic kidney disease, stage 3 (HCC) 06/27/2014  . Chronic pain syndrome 01/02/2014  . Depression   . DVT (deep venous thrombosis) (HCC) 01/12/2011   DVT noted in R peroneal vein 01/11/11, started on lovenox & coumadin-1st episode. Treated for approx 6 mo. Reoccurrence 04/2013, on life-long Coumadin now.   . 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 Qualifier: Diagnosis of  By: Marie BurowGarg MD, Marie     . Hypertension   . Low back pain    Review of Systems:  ROS: Pulmonary: pt denies increased work of breathing, shortness of breath,  Cardiac: pt denies palpitations, chest pain,  Abdominal: pt denies abdominal pain, nausea, vomiting, or diarrhea  Physical Exam:  Vitals:   07/04/17 1419  BP: 127/69  Pulse: 66  Temp: 98.2 F (36.8 C)  TempSrc: Oral  SpO2: 100%  Weight: 206 lb (93.4 kg)  Height: 5\' 6"  (1.676 m)   Physical Exam  Constitutional: No distress.  HENT:  Head: Normocephalic and atraumatic.  Neck: No JVD present.  Cardiovascular: Normal rate, regular rhythm and normal heart sounds. Exam reveals no gallop and no friction rub.  No murmur heard. Pulmonary/Chest: Effort normal and breath sounds normal. No respiratory distress. She has no wheezes. She has no rales. She exhibits no tenderness.  Abdominal: Soft. Bowel sounds are normal. She exhibits no distension and no mass.  There is no tenderness. There is no rebound and no guarding.  Neurological: She is alert.  Skin: She is not diaphoretic.    Social History   Socioeconomic History  . Marital status: Married    Spouse name: Not on file  . Number of children: Not on file  . Years of education: Not on file  . Highest education level: Not on file  Social Needs  . Financial resource strain: Not on file  . Food insecurity - worry: Not on file  . Food insecurity - inability: Not on file  . Transportation needs - medical: Not on file  . Transportation needs - non-medical: Not on file  Occupational History  . Occupation: former hairdresser    Associate Professormployer: UNEMPLOYED  Tobacco Use  . Smoking status: Current Every Day Smoker    Packs/day: 0.50    Types: Cigarettes  . Smokeless tobacco: Never Used  . Tobacco comment: Stress lately.  Waiting to do with husband  Substance and Sexual Activity  . Alcohol use: No    Alcohol/week: 0.0 oz  . Drug use: No  . Sexual activity: Not on file  Other Topics Concern  . Not on file  Social History Narrative   Former Interior and spatial designerhairdresser, currently unemployed. Lives at home with husband Marie Willis and 3 dogs.      Financial assistance approved by Marie Cobbeborah Willis [facility] and [facility].    Family History  Adopted: Yes  Assessment & Plan:   See Encounters Tab for problem based charting.  Patient discussed with Dr. Lynnae Willis

## 2017-07-04 NOTE — Assessment & Plan Note (Signed)
Patient has continued to have what she describes as radicular pain from her cervical spinal stenosis. She has not followed up on her referral to neurology for EMG.  She violated her pain contract last year by testing positive for benzos despite no prescription for these,  unfortunately for her syncope admission she convinced the inpatient team that she was on morphine sulfate, and this prescription was continued outpatient.  Around December, her husband called in and reported she had been stealing his opioid prescriptions.  The patient was once again today not willing to taper her morphine sulfate and try alternative therapies.  She once again said her ride was here and could not finish the visit.    -will schedule pt [facility]

## 2017-07-04 NOTE — Assessment & Plan Note (Signed)
Referral placed for mammogram Referral placed for OB GYN as pt now sexually active again and wants to see an obgyn for pap Pt given sample container and a FOBT immunohistochemical test on her last appointment however she never returned a stool sample, asked that she return a sample after today's visit.

## 2017-07-04 NOTE — Assessment & Plan Note (Signed)
BMP Latest Ref Rng & Units 11/28/2016 04/05/2016 04/22/2015  Glucose 65 - 99 mg/dL 161(W133(H) 95 960(A123(H)  BUN 6 - 20 mg/dL 54(U21(H) 24 17  Creatinine 0.44 - 1.00 mg/dL 9.81(X1.69(H) 9.14(N1.70(H) 8.29(F1.26(H)  BUN/Creat Ratio 9 - 23 - 14 13  Sodium 135 - 145 mmol/L 140 138 142  Potassium 3.5 - 5.1 mmol/L 4.2 4.7 4.1  Chloride 101 - 111 mmol/L 109 101 99  CO2 22 - 32 mmol/L 22 19 23   Calcium 8.9 - 10.3 mg/dL 9.1 9.1 9.3   Will check CMP, and urinalysis.  -pt uninterested in lisinopril even at a lower dose due to feeling that it drops her blood pressure.

## 2017-07-04 NOTE — Assessment & Plan Note (Signed)
BP Readings from Last 3 Encounters:  07/04/17 127/69  02/07/17 139/82  12/29/16 133/71   Patient's blood pressure within goal today despite not taking her blood pressure medication.  She has been uninterested in taking her lisinopril since her admission for syncope last year.  Prior to this she was taking 20mg  daily.   -continue to monitor for now

## 2017-07-05 ENCOUNTER — Other Ambulatory Visit: Payer: Self-pay

## 2017-07-05 DIAGNOSIS — G894 Chronic pain syndrome: Secondary | ICD-10-CM

## 2017-07-05 LAB — LIPID PANEL
Chol/HDL Ratio: 5 ratio — ABNORMAL HIGH (ref 0.0–4.4)
Cholesterol, Total: 195 mg/dL (ref 100–199)
HDL: 39 mg/dL — ABNORMAL LOW (ref >39)
LDL Calculated: 121 mg/dL — ABNORMAL HIGH (ref 0–99)
Triglycerides: 173 mg/dL — ABNORMAL HIGH (ref 0–149)
VLDL Cholesterol Cal: 35 mg/dL (ref 5–40)

## 2017-07-05 LAB — CBC
HEMATOCRIT: 41.3 % (ref 34.0–46.6)
Hemoglobin: 14 g/dL (ref 11.1–15.9)
MCH: 30.7 pg (ref 26.6–33.0)
MCHC: 33.9 g/dL (ref 31.5–35.7)
MCV: 91 fL (ref 79–97)
Platelets: 236 10*3/uL (ref 150–379)
RBC: 4.56 x10E6/uL (ref 3.77–5.28)
RDW: 13.7 % (ref 12.3–15.4)
WBC: 8.7 10*3/uL (ref 3.4–10.8)

## 2017-07-05 LAB — URINALYSIS, COMPLETE
Bilirubin, UA: NEGATIVE
Glucose, UA: NEGATIVE
Ketones, UA: NEGATIVE
Nitrite, UA: POSITIVE — AB
PROTEIN UA: NEGATIVE
RBC UA: NEGATIVE
Specific Gravity, UA: 1.006 (ref 1.005–1.030)
UUROB: 0.2 mg/dL (ref 0.2–1.0)
pH, UA: 5.5 (ref 5.0–7.5)

## 2017-07-05 LAB — CMP14 + ANION GAP
ALBUMIN: 4.7 g/dL (ref 3.6–4.8)
ALT: 13 IU/L (ref 0–32)
AST: 9 IU/L (ref 0–40)
Albumin/Globulin Ratio: 2 (ref 1.2–2.2)
Alkaline Phosphatase: 84 IU/L (ref 39–117)
Anion Gap: 16 mmol/L (ref 10.0–18.0)
BUN/Creatinine Ratio: 9 — ABNORMAL LOW (ref 12–28)
BUN: 13 mg/dL (ref 8–27)
Bilirubin Total: 0.3 mg/dL (ref 0.0–1.2)
CO2: 24 mmol/L (ref 20–29)
CREATININE: 1.51 mg/dL — AB (ref 0.57–1.00)
Calcium: 9.7 mg/dL (ref 8.7–10.3)
Chloride: 100 mmol/L (ref 96–106)
GFR calc non Af Amer: 37 mL/min/{1.73_m2} — ABNORMAL LOW (ref >59)
GFR, EST AFRICAN AMERICAN: 43 mL/min/{1.73_m2} — AB (ref >59)
Globulin, Total: 2.3 g/dL (ref 1.5–4.5)
Glucose: 109 mg/dL — ABNORMAL HIGH (ref 65–99)
Potassium: 4 mmol/L (ref 3.5–5.2)
Sodium: 140 mmol/L (ref 134–144)
Total Protein: 7 g/dL (ref 6.0–8.5)

## 2017-07-05 LAB — HEMOGLOBIN A1C
Est. average glucose Bld gHb Est-mCnc: 128 mg/dL
Hgb A1c MFr Bld: 6.1 % — ABNORMAL HIGH (ref 4.8–5.6)

## 2017-07-05 LAB — MICROSCOPIC EXAMINATION: CASTS: NONE SEEN /LPF

## 2017-07-05 MED ORDER — MORPHINE SULFATE 15 MG PO TABS: 15.0000 mg | ORAL_TABLET | Freq: Three times a day (TID) | ORAL | 0 refills | Status: DC | PRN

## 2017-07-05 NOTE — Progress Notes (Signed)
Internal Medicine Clinic Attending  Case discussed with Dr. Frances FurbishWinfrey at the time of the visit.  We reviewed the resident's history and exam and pertinent patient test results.  I agree with the assessment, diagnosis, and plan of care documented in the resident's note. The reasons to consider a taper would inc prior contract violation, not going to referrals. If a taper is decided upon, we will need to offer non opioid pain relief inc duloxetine or pregabalin. Would also need freq assessment during taper to assess withdrawal sxs and reponse.

## 2017-07-05 NOTE — Telephone Encounter (Signed)
refilled 

## 2017-07-05 NOTE — Telephone Encounter (Signed)
morphine (MSIR) 15 MG tablet   [facility] @ [facility] [address].

## 2017-07-06 ENCOUNTER — Telehealth: Payer: Self-pay

## 2017-07-06 NOTE — Telephone Encounter (Signed)
Pt states the pharmacy will not filled    morphine (MSIR) 15 MG tablet   Until 07/08/2017. Pt states she will be out on 07/07/2017, please call pt back.

## 2017-07-09 LAB — TOXASSURE SELECT,+ANTIDEPR,UR

## 2017-07-10 ENCOUNTER — Emergency Department (HOSPITAL_COMMUNITY): Payer: Medicaid Other

## 2017-07-10 ENCOUNTER — Encounter (HOSPITAL_COMMUNITY): Payer: Self-pay

## 2017-07-10 ENCOUNTER — Other Ambulatory Visit: Payer: Self-pay

## 2017-07-10 ENCOUNTER — Emergency Department (HOSPITAL_COMMUNITY)
Admission: EM | Admit: 2017-07-10 | Discharge: 2017-07-10 | Disposition: A | Discharge status: Home / Self Care | Payer: Medicaid Other | Attending: Emergency Medicine | Admitting: Emergency Medicine

## 2017-07-10 DIAGNOSIS — Z7901 Long term (current) use of anticoagulants: Secondary | ICD-10-CM | POA: Diagnosis not present

## 2017-07-10 DIAGNOSIS — I129 Hypertensive chronic kidney disease with stage 1 through stage 4 chronic kidney disease, or unspecified chronic kidney disease: Secondary | ICD-10-CM | POA: Diagnosis not present

## 2017-07-10 DIAGNOSIS — W01198A Fall on same level from slipping, tripping and stumbling with subsequent striking against other object, initial encounter: Secondary | ICD-10-CM | POA: Insufficient documentation

## 2017-07-10 DIAGNOSIS — N183 Chronic kidney disease, stage 3 (moderate): Secondary | ICD-10-CM | POA: Insufficient documentation

## 2017-07-10 DIAGNOSIS — Z79899 Other long term (current) drug therapy: Secondary | ICD-10-CM | POA: Insufficient documentation

## 2017-07-10 DIAGNOSIS — Y9301 Activity, walking, marching and hiking: Secondary | ICD-10-CM | POA: Insufficient documentation

## 2017-07-10 DIAGNOSIS — S52125A Nondisplaced fracture of head of left radius, initial encounter for closed fracture: Secondary | ICD-10-CM | POA: Insufficient documentation

## 2017-07-10 DIAGNOSIS — Y999 Unspecified external cause status: Secondary | ICD-10-CM | POA: Diagnosis not present

## 2017-07-10 DIAGNOSIS — S59902A Unspecified injury of left elbow, initial encounter: Secondary | ICD-10-CM | POA: Diagnosis present

## 2017-07-10 DIAGNOSIS — Y929 Unspecified place or not applicable: Secondary | ICD-10-CM | POA: Diagnosis not present

## 2017-07-10 DIAGNOSIS — F17210 Nicotine dependence, cigarettes, uncomplicated: Secondary | ICD-10-CM | POA: Insufficient documentation

## 2017-07-10 NOTE — Discharge Instructions (Signed)
Get help right away if: You have severe pain when you stretch your fingers. You have fluid or a bad smell coming from your splint. Your hand or fingers get cold or turn pale or blue. You lose feeling in any part of your hand or arm.

## 2017-07-10 NOTE — ED Notes (Signed)
Pt returned from outside

## 2017-07-10 NOTE — ED Triage Notes (Signed)
Pt reports slipping on wet floor and slipping, falling backwards, and landing on left arm. Pt reports hitting the back of head on floor. Denies loc. A&Ox4. Pt reports pain to left elbow that radiates down to hand

## 2017-07-10 NOTE — ED Notes (Signed)
Pt adamant that she needed to step out and smoke a cigarette. Pt informed that it is not proper procedure, once you are roomed, that you step out. Pt informed that she needs to hurry back or she may lose her room. Pt verbalized understanding.

## 2017-07-10 NOTE — ED Notes (Signed)
Declined W/C at D/C and was escorted to lobby by RN. 

## 2017-07-10 NOTE — ED Provider Notes (Signed)
[facility] EMERGENCY DEPARTMENT Provider Note   CSN: [id number] Arrival date & time: 07/10/17  1046     History   Chief Complaint Chief Complaint  Patient presents with  . Arm Pain    HPI Marie Willis is a 62 y.o. female who presents emergency department chief complaint of left elbow pain.  2 days ago patient slipped on an oil spot her husband had spilled some oil on the floor.  She fell backward and slammed her left arm against the floor.  She also hit her head but denies losing consciousness.  She states that she did not have much pain until later when her left elbow began to swell.  She had difficulty ranging the elbow, pain radiating into the wrist, no numbness or tingling.  Patient is under pain control with her physician.  She is unable to take NSAIDs and due to chronic kidney disease and she is on a blood thinner.  HPI  Past Medical History:  Diagnosis Date  . Anxiety   . Chronic kidney disease, stage 3 (HCC) 06/27/2014  . Chronic pain syndrome 01/02/2014  . Dehydration   . Depression   . DVT (deep venous thrombosis) (HCC) 01/12/2011   DVT noted in R peroneal vein 01/11/11, started on lovenox & coumadin-1st episode. Treated for approx 6 mo. Reoccurrence 04/2013, on life-long Coumadin now.   . H/O dental abscess 10/05/2012  . 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 Qualifier: Diagnosis of  By: Eben Burow MD, Ankit     . Hypertension   . Low back pain   . Thigh cramp 03/08/2016    Patient Active Problem List   Diagnosis Date Noted  . Hyperlipidemia 07/04/2017  . Right shoulder pain 02/07/2017  . Bilateral hand pain 12/29/2016  . Vertigo 12/09/2016  . Syncope and collapse 11/29/2016  . Right knee pain 07/22/2016  . Left knee pain 08/27/2015  . Chronic anticoagulation 06/27/2014  . Chronic kidney disease, stage 3 (HCC) 06/27/2014  . Chronic pain  syndrome 01/02/2014  . DVT (deep venous thrombosis) (HCC) 01/12/2011  . Healthcare maintenance 01/12/2011  . TOBACCO USER 02/09/2010  . Anxiety with depression 07/16/2009  . Essential hypertension 07/16/2009  . Herniated lumbar disc without myelopathy 07/16/2009  . Depression 07/16/2009    Past Surgical History:  Procedure Laterality Date  . LUMBAR LAMINECTOMY      OB History    No data available       Home Medications    Prior to Admission medications   Medication Sig Start Date End Date Taking? Authorizing Provider  acetaminophen (TYLENOL) 500 MG tablet Take 1,000 mg by mouth every 6 (six) hours as needed for mild pain.    [provider]  atorvastatin (LIPITOR) 40 MG tablet Take 1 tablet (40 mg total) by mouth daily. 07/04/17   Angelita Ingles, MD  busPIRone (BUSPAR) 10 MG tablet Take 10 mg by mouth 3 (three) times daily.     [provider]  cetirizine (ZYRTEC) 10 MG tablet Take 10 mg by mouth daily.    [provider]  diclofenac sodium (VOLTAREN) 1 % GEL Apply 4 g topically 4 (four) times daily as needed (pain). 02/07/17   Angelita Ingles, MD  lisinopril (PRINIVIL,ZESTRIL) 20 MG tablet TAKE 1 TABLET (20 MG TOTAL) BY MOUTH DAILY. Patient not taking: Reported on 07/04/2017 03/28/17   Angelita Ingles, MD  meloxicam (MOBIC) 7.5 MG  tablet Take 1 tablet (7.5 mg total) by mouth daily. As needed for pain.  Do not use more than 4 days per week. 03/28/17   Angelita Ingles, MD  morphine (MSIR) 15 MG tablet Take 1 tablet (15 mg total) by mouth every 8 (eight) hours as needed for severe pain. Do not fill before 06/10/17 07/05/17 08/04/17  Angelita Ingles, MD  Olopatadine HCl (PATADAY) 0.2 % SOLN Apply 1-2 drops to eye 2 (two) times daily as needed (allergies).    [provider]  XARELTO 20 MG TABS tablet TAKE 1 TABLET(20 MG) BY MOUTH DAILY WITH SUPPER 03/25/17   Angelita Ingles, MD    Family History Family History  Adopted: Yes     Social History Social History   Tobacco Use  . Smoking status: Current Every Day Smoker    Packs/day: 0.50    Types: Cigarettes  . Smokeless tobacco: Never Used  . Tobacco comment: Stress lately.  Waiting to do with husband  Substance Use Topics  . Alcohol use: No    Alcohol/week: 0.0 oz  . Drug use: No     Allergies   Tramadol hcl; Amoxicillin; Celexa [citalopram hydrobromide]; Elavil [amitriptyline hcl]; Gabapentin; Paroxetine hcl; Sulfonamide derivatives; Trazodone and nefazodone; and Zolpidem tartrate   Review of Systems Review of Systems  Ten systems reviewed and are negative for acute change, except as noted in the HPI.   Physical Exam Updated Vital Signs BP (!) 131/91 (BP Location: Right Arm)   Pulse 74   Temp 98.4 F (36.9 C) (Oral)   Resp 18   Ht 5\' 6"  (1.676 m)   Wt 93.4 kg (206 lb)   LMP 08/14/2010   SpO2 97%   BMI 33.25 kg/m   Physical Exam Physical Exam  Nursing note and vitals reviewed. Constitutional: She is oriented to person, place, and time. She appears well-developed and well-nourished. No distress.  HENT:  Head: Normocephalic and atraumatic.  Eyes: Conjunctivae normal and EOM are normal. Pupils are equal, round, and reactive to light. No scleral icterus.  Neck: Normal range of motion.  Cardiovascular: Normal rate, regular rhythm and normal heart sounds.  Exam reveals no gallop and no friction rub.   No murmur heard. Pulmonary/Chest: Effort normal and breath sounds normal. No respiratory distress.  Abdominal: Soft. Bowel sounds are normal. She exhibits no distension and no mass. There is no tenderness. There is no guarding.  Neurological: She is alert and oriented to person, place, and time.  Musculoskeletal: Left elbow with obvious effusion, tender to palpation.  Patient has difficulty with flexion extension but pain is worse with supination and pronation.  She is unable to supinate or pronate fully. Skin: Skin is warm and dry. She is not  diaphoretic.     ED Treatments / Results  Labs (all labs ordered are listed, but only abnormal results are displayed) Labs Reviewed - No data to display  EKG  EKG Interpretation None       Radiology Dg Elbow Complete Left  Result Date: 07/10/2017 CLINICAL DATA:  Pain after fall. EXAM: LEFT ELBOW - COMPLETE 3+ VIEW COMPARISON:  None. FINDINGS: There is a nondisplaced fracture through the radial head best seen on oblique imaging. The distal humerus and proximal ulna are intact. A joint effusion is identified, consistent with the visualized fracture. IMPRESSION: Nondisplaced fracture through the radial head.  Joint effusion. Electronically Signed   By: Gerome Sam [name] M.D   On: 07/10/2017 12:43   Dg Wrist Complete  Left  Result Date: 07/10/2017 CLINICAL DATA:  Pain after trauma EXAM: LEFT WRIST - COMPLETE 3+ VIEW COMPARISON:  None. FINDINGS: There is no evidence of fracture or dislocation. There is no evidence of arthropathy or other focal bone abnormality. Soft tissues are unremarkable. IMPRESSION: Negative. Electronically Signed   By: Gerome Samavid  Williams [name] M.D   On: 07/10/2017 12:44    Procedures Procedures (including critical care time)  Medications Ordered in ED Medications - No data to display   Initial Impression / Assessment and Plan / ED Course  I have reviewed the triage vital signs and the nursing notes.  Pertinent labs & imaging results that were available during my care of the patient were reviewed by me and considered in my medical decision making (see chart for details).     Patient x-ray with posterior fat pad sign.  Obvious radial head fracture that is on the placed.  She is a normal wrist x-ray with full range of motion.  The patient will be placed in sling immobilizer and discharged to follow-up with hand specialist on call today.  She has pain control with her PCP and I have advised the patient to follow-up should she need more.  She appears appropriate for  discharge at this time  Final Clinical Impressions(s) / ED Diagnoses   Final diagnoses:  Closed nondisplaced fracture of head of left radius, initial encounter    ED Discharge Orders    None       Arthor CaptainHarris, Rohail Klees, PA-C 07/10/17 2034    Raeford RazorKohut, Stephen, MD 07/10/17 2158

## 2017-07-11 ENCOUNTER — Telehealth: Payer: Self-pay | Admitting: Internal Medicine

## 2017-07-11 NOTE — Telephone Encounter (Signed)
I will try to get in touch with her tomorrow evening.  I am currently working night shifts.

## 2017-07-11 NOTE — Telephone Encounter (Signed)
Pt Called about her Pain Medications.  Patient states she broke her arm in 2 places over the weekend,  and visited the ED and was told to call her PCP about her pain meds.  Please call patient back.

## 2017-07-12 MED ORDER — MORPHINE SULFATE 15 MG PO TABS: 15.0000 mg | ORAL_TABLET | ORAL | 0 refills | Status: DC | PRN

## 2017-07-12 NOTE — Telephone Encounter (Signed)
Called pt will write prescription for 8 additional Morphine sulfate tabs

## 2017-07-12 NOTE — Telephone Encounter (Signed)
discussed fractured radius and need for additional MSIR will write for 8 additional tablets.  Discussed with pt to not take more than one every four hours which is what she has been doing on her own and alternative therapies for pain relief.

## 2017-07-25 ENCOUNTER — Other Ambulatory Visit: Payer: Self-pay

## 2017-07-25 DIAGNOSIS — G894 Chronic pain syndrome: Secondary | ICD-10-CM

## 2017-07-25 NOTE — Telephone Encounter (Signed)
        morphine (MSIR) 15 MG tablet   [facility] [address]

## 2017-07-26 MED ORDER — MORPHINE SULFATE 15 MG PO TABS: 15.0000 mg | ORAL_TABLET | Freq: Three times a day (TID) | ORAL | 0 refills | Status: DC | PRN

## 2017-07-26 NOTE — Telephone Encounter (Signed)
refilled 

## 2017-07-29 ENCOUNTER — Ambulatory Visit (INDEPENDENT_AMBULATORY_CARE_PROVIDER_SITE_OTHER): Payer: Medicaid Other | Admitting: Internal Medicine

## 2017-07-29 DIAGNOSIS — G894 Chronic pain syndrome: Secondary | ICD-10-CM | POA: Diagnosis not present

## 2017-07-29 DIAGNOSIS — N183 Chronic kidney disease, stage 3 (moderate): Secondary | ICD-10-CM | POA: Diagnosis not present

## 2017-07-29 DIAGNOSIS — I129 Hypertensive chronic kidney disease with stage 1 through stage 4 chronic kidney disease, or unspecified chronic kidney disease: Secondary | ICD-10-CM | POA: Diagnosis not present

## 2017-07-29 DIAGNOSIS — Z79891 Long term (current) use of opiate analgesic: Secondary | ICD-10-CM

## 2017-07-29 DIAGNOSIS — I10 Essential (primary) hypertension: Secondary | ICD-10-CM

## 2017-07-29 DIAGNOSIS — M549 Dorsalgia, unspecified: Secondary | ICD-10-CM

## 2017-07-29 DIAGNOSIS — S52125D Nondisplaced fracture of head of left radius, subsequent encounter for closed fracture with routine healing: Secondary | ICD-10-CM

## 2017-07-29 DIAGNOSIS — S52122A Displaced fracture of head of left radius, initial encounter for closed fracture: Secondary | ICD-10-CM | POA: Insufficient documentation

## 2017-07-29 DIAGNOSIS — Z7901 Long term (current) use of anticoagulants: Secondary | ICD-10-CM | POA: Diagnosis not present

## 2017-07-29 DIAGNOSIS — Z86718 Personal history of other venous thrombosis and embolism: Secondary | ICD-10-CM | POA: Diagnosis not present

## 2017-07-29 DIAGNOSIS — W19XXXD Unspecified fall, subsequent encounter: Secondary | ICD-10-CM

## 2017-07-29 NOTE — Assessment & Plan Note (Addendum)
BP 133/81 - Continue to monitor - RTC on 08/25/2017 for visit with PCP

## 2017-07-29 NOTE — Assessment & Plan Note (Signed)
Assessment Presented to the ER on 3/17 after a fall, found to have a nondisplaced left radial head fracture. She received an extra 8 pills of MSIR on top of her MSIR 15mg  q8h PRN chronic prescription. She reports that she is out of her MSIR because she required 5-6 doses daily for a few days after the fall. She continues to endorse left arm pain and on exam, had decreased strength, however I strongly suspect inadequate patient effort. Her acute pain should be almost completely resolved at this point 3 weeks after the initial injury. She requests a refill of her MSIR because she states that she ran out 1 week early due to requiring increased doses surrounding the fall.  Her PCP provided her with a prescription of 30-day supply for MSIR on 4/2. Her pharmacy would not fill this until 4/12 due to the timing of her previous 30-day supply. I called her pharmacy to allow her to pick up the MSIR today.  Plan - Called pharmacy to allow patient to pick up #90 pills for 30-day supply of MSIR 15mg  q8h PRN

## 2017-07-29 NOTE — Patient Instructions (Addendum)
FOLLOW-UP INSTRUCTIONS When: 08/25/2017 (appointment already scheduled) For: BP and health maintenance What to bring: medications   Ms. Marie Willis,  It was a pleasure to meet you today.  A refill of your morphine [facility] [address] You can go pick this up.  Please return to see us on 08/25/2017 for an appointment with your primary doctor.

## 2017-07-29 NOTE — Progress Notes (Signed)
CC: left arm pain  HPI:  [name] is a 62 y.o. 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  She was seen in the ER on 3/17 after a fall. She was found to have a nondisplaced left radial head fracture and was instructed to follow up with her PCP for pain medications. She is on MSIR 15mg  q8h PRN typically for chronic back pain. She called our office and she was provided an extra 2-day prescription of MSIR and instructed to take it every 4 hours for the next couple days for the acute flare up of pain.   She reports that since the fall, she has had increased left arm pain. She cannot quite recall how many extra doses of morphine she was taking and for how long, but states that immediately after the fall, she was taking 5-6 doses of MSIR daily. She reports she took 3 yesterday. She states she is now out of her MSIR and does not have another refill available until next week and is requesting a refill of her pain medication.  On chart review, she called our clinic on 4/1 asking for a refill of her MSIR. This was refilled for a 30-day supply by her PCP. She called her pharmacy while in clinic and was told that she was unable to pick the medication up until 4/12 because she just picked up a 30 day supply on 3/15. I called her pharmacy to allow her to pick this medication up today.  On chart review, she does have some red flags. She broke a pain contract in 2016 when she was found to be positive for Xanax that was not prescribed by our office. She has also left several visits without providing a urine sample despite being asked for one.  Past Medical History:  Diagnosis Date  . Anxiety   . Chronic kidney disease, stage 3 (HCC) 06/27/2014  . Chronic pain syndrome 01/02/2014  . Dehydration   . Depression   . DVT (deep venous thrombosis) (HCC) 01/12/2011   DVT noted in R peroneal vein 01/11/11, started on lovenox  & coumadin-1st episode. Treated for approx 6 mo. Reoccurrence 04/2013, on life-long Coumadin now.   . H/O dental abscess 10/05/2012  . 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 Qualifier: Diagnosis of  By: Eben BurowGarg MD, Ankit     . Hypertension   . Low back pain   . Thigh cramp 03/08/2016   Review of Systems:   GEN: Negative for fevers MSK: Positive for left arm pain and back pain NEURO: Negative for left arm numbness. Positive for intermittent sharp shooting pain down bilateral LE  Physical Exam:  Vitals:   07/29/17 1107  BP: 133/81  Pulse: 74  Temp: 98.1 F (36.7 C)  TempSrc: Oral  SpO2: 99%  Weight: 202 lb 9.6 oz (91.9 kg)  Height: 5\' 6"  (1.676 m)   GEN: Sitting in chair in NAD CV: NR & RR, no m/r/g PULM: CTAB, no wheezes or rales MSK: 3/5 strength on LUE (although exam limited by patient effort), 5/5 strength on RUE. No numbness of BUE. BACK: Mild paraspinal tenderness of bilateral lower back. No focal spinal tenderness PSYCH: Anxious, talkative, mildly pressured speech, thought is tangential  Assessment & Plan:   See Encounters Tab for problem based charting.  Patient discussed with Dr. Bea LauraE.  Heber De[place]

## 2017-07-29 NOTE — Assessment & Plan Note (Signed)
Assessment Several red flags, including not providing urine samples when asked and violated pain contract in 2016 by testing for non-prescribed benzodiazepines.  Plan - Continue attempting to taper morphine

## 2017-07-29 NOTE — Progress Notes (Signed)
133/81 

## 2017-08-01 NOTE — Progress Notes (Signed)
Internal Medicine Clinic Attending  Case discussed with Dr. Huang at the time of the visit.  We reviewed the resident's history and exam and pertinent patient test results.  I agree with the assessment, diagnosis, and plan of care documented in the resident's note. 

## 2017-08-19 ENCOUNTER — Encounter: Payer: Medicaid Other | Admitting: Nurse Practitioner

## 2017-08-19 ENCOUNTER — Other Ambulatory Visit: Payer: Self-pay | Admitting: Internal Medicine

## 2017-08-19 DIAGNOSIS — Z1231 Encounter for screening mammogram for malignant neoplasm of breast: Secondary | ICD-10-CM

## 2017-08-22 ENCOUNTER — Other Ambulatory Visit: Payer: Self-pay

## 2017-08-22 ENCOUNTER — Encounter: Payer: Self-pay | Admitting: Internal Medicine

## 2017-08-22 ENCOUNTER — Ambulatory Visit: Payer: Medicaid Other | Admitting: Internal Medicine

## 2017-08-22 VITALS — BP 137/87 | HR 96 | Temp 99.1°F | Ht 66.0 in | Wt 204.9 lb

## 2017-08-22 DIAGNOSIS — N183 Chronic kidney disease, stage 3 (moderate): Secondary | ICD-10-CM | POA: Diagnosis not present

## 2017-08-22 DIAGNOSIS — Z7901 Long term (current) use of anticoagulants: Secondary | ICD-10-CM

## 2017-08-22 DIAGNOSIS — G894 Chronic pain syndrome: Secondary | ICD-10-CM | POA: Diagnosis present

## 2017-08-22 DIAGNOSIS — I129 Hypertensive chronic kidney disease with stage 1 through stage 4 chronic kidney disease, or unspecified chronic kidney disease: Secondary | ICD-10-CM | POA: Diagnosis not present

## 2017-08-22 DIAGNOSIS — M542 Cervicalgia: Secondary | ICD-10-CM | POA: Diagnosis not present

## 2017-08-22 DIAGNOSIS — Z79891 Long term (current) use of opiate analgesic: Secondary | ICD-10-CM | POA: Diagnosis not present

## 2017-08-22 DIAGNOSIS — Z86718 Personal history of other venous thrombosis and embolism: Secondary | ICD-10-CM | POA: Diagnosis not present

## 2017-08-22 DIAGNOSIS — M549 Dorsalgia, unspecified: Secondary | ICD-10-CM

## 2017-08-22 MED ORDER — ACETAMINOPHEN-CODEINE #3 300-30 MG PO TABS: 1.0000 | ORAL_TABLET | Freq: Three times a day (TID) | ORAL | 0 refills | Status: AC | PRN

## 2017-08-22 NOTE — Patient Instructions (Addendum)
FOLLOW-UP INSTRUCTIONS When: 08/25/2017 (appointment already scheduled) For: BP management What to bring: medications   Ms. Drone,  It was good to see you today.  Like we discussed, it is our clinic's policy that we cannot give refills of pain medications early. You have an appointment with your primary doctor on May 2. You can discuss getting a refill with him, and we will be able to complete a pain contract for continuing to receive the medications from our clinic at that time.

## 2017-08-22 NOTE — Progress Notes (Signed)
   CC: f/u of chronic back and neck pain  HPI:  [name] is a 62 y.o. 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 She reports that since filling this prescription, she has had increased back and neck pain and thinks she is taking her MSIR three times daily, but admits that she may have taken them four times in a day due to the pain.  She is out of the [facility]. She reports being under significant stress recently and has an upcoming court date on Wednesday.  She has an appointment scheduled with PCP in 3 days. She is afraid of withdrawing from the medication.  Please see the assessment and plan below for the status of the patient's chronic medical problems.  Past Medical History:  Diagnosis Date  . Anxiety   . Chronic kidney disease, stage 3 (HCC) 06/27/2014  . Chronic pain syndrome 01/02/2014  . Dehydration   . Depression   . DVT (deep venous thrombosis) (HCC) 01/12/2011   DVT noted in R peroneal vein 01/11/11, started on lovenox & coumadin-1st episode. Treated for approx 6 mo. Reoccurrence 04/2013, on life-long Coumadin now.   . H/O dental abscess 10/05/2012  . 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 Qualifier: Diagnosis of  By: Eben Burow MD, Ankit     . Hypertension   . Low back pain   . Thigh cramp 03/08/2016   Review of Systems:   GEN: Negative for fevers CV: Negative for chest pain  PULM: Negative for cough or SOB MSK: Positive for neck and back pain. No radiculopathy  Physical Exam:  Vitals:   08/22/17 1541  BP: 137/87  Pulse: 96  Temp: 99.1 F (37.3 C)  TempSrc: Oral  SpO2: 99%  Weight: 204 lb 13.9 oz (92.9 kg)  Height:  (1.676 m)   GEN: Sitting comfortably in chair in  NAD CV: NR & RR, no m/r/g PULM: CTAB, no wheezes or rales ABD: Soft, NT, ND, +BS MSK: TTP of left posterior neck and bilateral lower paraspinal muscles.  Assessment & Plan:   See Encounters Tab for problem based charting.  Patient seen with Dr. Josem Kaufmann

## 2017-08-22 NOTE — Assessment & Plan Note (Addendum)
Assessment Reports running out of MSIR  q8h PRN. Per [place] database, last Rx for 30 day supply of MSIR filled on 07/29/2017. States she has had increased back and neck pain recently and may have taken MSIR 4 times daily on some days due to the increased pain. She requests refill of MSIR.   Discussed with patient our clinic's policy that we cannot refill pain medications early. She expressed understanding, however states she is afraid of withdrawing. Will obtain ToxAssure today and have prescribed 3 days of tylenol #3 to get her to PCP's appointment on 08/25/2017.   Plan - UDS today - Tylenol #3  q8h PRN x3 days to prevent withdrawal (10 tablets provided) - F/u with PCP 08/25/2017 - Discuss tapering morphine at PCP visit - Complete pain contract at PCP visit

## 2017-08-25 ENCOUNTER — Telehealth: Payer: Self-pay | Admitting: Internal Medicine

## 2017-08-25 ENCOUNTER — Ambulatory Visit: Payer: Medicaid Other | Admitting: Internal Medicine

## 2017-08-25 ENCOUNTER — Other Ambulatory Visit: Payer: Self-pay

## 2017-08-25 VITALS — BP 143/80 | HR 85 | Temp 98.4°F | Wt 203.8 lb

## 2017-08-25 DIAGNOSIS — R7303 Prediabetes: Secondary | ICD-10-CM

## 2017-08-25 DIAGNOSIS — I10 Essential (primary) hypertension: Secondary | ICD-10-CM

## 2017-08-25 DIAGNOSIS — M5412 Radiculopathy, cervical region: Secondary | ICD-10-CM | POA: Diagnosis not present

## 2017-08-25 DIAGNOSIS — F17210 Nicotine dependence, cigarettes, uncomplicated: Secondary | ICD-10-CM

## 2017-08-25 DIAGNOSIS — G894 Chronic pain syndrome: Secondary | ICD-10-CM | POA: Diagnosis not present

## 2017-08-25 DIAGNOSIS — E785 Hyperlipidemia, unspecified: Secondary | ICD-10-CM | POA: Diagnosis not present

## 2017-08-25 DIAGNOSIS — Z79891 Long term (current) use of opiate analgesic: Secondary | ICD-10-CM

## 2017-08-25 DIAGNOSIS — Z560 Unemployment, unspecified: Secondary | ICD-10-CM | POA: Diagnosis not present

## 2017-08-25 DIAGNOSIS — M4802 Spinal stenosis, cervical region: Secondary | ICD-10-CM | POA: Diagnosis not present

## 2017-08-25 DIAGNOSIS — Z79899 Other long term (current) drug therapy: Secondary | ICD-10-CM

## 2017-08-25 DIAGNOSIS — F17200 Nicotine dependence, unspecified, uncomplicated: Secondary | ICD-10-CM

## 2017-08-25 DIAGNOSIS — Z0000 Encounter for general adult medical examination without abnormal findings: Secondary | ICD-10-CM

## 2017-08-25 MED ORDER — ROSUVASTATIN CALCIUM 20 MG PO TABS
20.0000 mg | ORAL_TABLET | Freq: Every day | ORAL | 3 refills | Status: AC
Start: 2017-08-25 — End: ?

## 2017-08-25 MED ORDER — MORPHINE SULFATE 15 MG PO TABS: 15.0000 mg | ORAL_TABLET | Freq: Three times a day (TID) | ORAL | 0 refills | Status: DC | PRN

## 2017-08-25 MED ORDER — NICOTINE 14 MG/24HR TD PT24: 14.0000 mg | MEDICATED_PATCH | Freq: Every day | TRANSDERMAL | 0 refills | Status: AC

## 2017-08-25 NOTE — Progress Notes (Signed)
CC: follow up on HTN, prediabetes, HLD, smoking  HPI:  [name] is a 62 y.o. female with PMH below.  She reports doing well overall.  No new complaints.  Therefore we focused on her overall health.  Please see A&P for status of the patient's chronic medical conditions  Past Medical History:  Diagnosis Date  . Anxiety   . Chronic kidney disease, stage 3 (HCC) 06/27/2014  . Chronic pain syndrome 01/02/2014  . Dehydration   . Depression   . DVT (deep venous thrombosis) (HCC) 01/12/2011   DVT noted in R peroneal vein 01/11/11, started on lovenox & coumadin-1st episode. Treated for approx 6 mo. Reoccurrence 04/2013, on life-long Coumadin now.   . H/O dental abscess 10/05/2012  . 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 Qualifier: Diagnosis of  By: Eben Burow MD, Ankit     . Hypertension   . Low back pain   . Thigh cramp 03/08/2016   Review of Systems:  ROS: Pulmonary: pt denies increased work of breathing, shortness of breath,  Cardiac: pt denies palpitations, chest pain,  Abdominal: pt denies abdominal pain, nausea, vomiting, or diarrhea  Physical Exam:  Vitals:   08/25/17 1529  BP: (!) 143/80  Pulse: 85  Temp: 98.4 F (36.9 C)  TempSrc: Oral  SpO2: 100%  Weight: 203 lb 12.8 oz (92.4 kg)   Physical Exam  Constitutional: No distress.  Cardiovascular: Normal rate, regular rhythm and normal heart sounds. Exam reveals no gallop and no friction rub.  No murmur heard. Pulmonary/Chest: Effort normal and breath sounds normal. No respiratory distress. She has no wheezes. She has no rales. She exhibits no tenderness.  Abdominal: Soft. Bowel sounds are normal. She exhibits no distension and no mass. There is no tenderness. There is no rebound and no guarding.  Musculoskeletal: She exhibits tenderness (cervical spine). She exhibits no edema.  Neurological: She is alert.  Skin:  She is not diaphoretic.    Social History   Socioeconomic History  . Marital status: Married    Spouse name: Not on file  . Number of children: Not on file  . Years of education: Not on file  . Highest education level: Not on file  Occupational History  . Occupation: former hairdresser    Associate Professor: UNEMPLOYED  Social Needs  . Financial resource strain: Not on file  . Food insecurity:    Worry: Not on file    Inability: Not on file  . Transportation needs:    Medical: Not on file    Non-medical: Not on file  Tobacco Use  . Smoking status: Current Every Day Smoker    Packs/day: 0.50    Types: Cigarettes  . Smokeless tobacco: Never Used  . Tobacco comment: Stress lately.  Waiting to do with husband  Substance and Sexual Activity  . Alcohol use: No    Alcohol/week: 0.0 oz  . Drug use: No  . Sexual activity: Not on file  Lifestyle  . Physical activity:    Days per week: Not on file    Minutes per session: Not on file  . Stress: Not on file  Relationships  . Social connections:    Talks on phone: Not on file    Gets together: Not on file    Attends religious service: Not on file    Active member of club or organization: Not on file    Attends meetings of clubs or  organizations: Not on file    Relationship status: Not on file  . Intimate partner violence:    Fear of current or ex partner: Not on file    Emotionally abused: Not on file    Physically abused: Not on file    Forced sexual activity: Not on file  Other Topics Concern  . 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      Financial assistance approved by Rudell Cobb for 100 discount at [facility] and has [facility] card, Nov 4th 2011.    Family History  Adopted: Yes    Assessment & Plan:   See Encounters Tab for problem based charting.  Patient discussed with Dr. Sandre Kitty

## 2017-08-25 NOTE — Patient Instructions (Addendum)
Good to see you today, we will switch you to a higher intensity cholesterol medication based on your last lipid pane.  Nicotene patches were ordered to help you quit smoking.  Please bring a log of blood pressures twice daily for a week one in morning and one in the evening.  Please follow up with neurology as instructed at your multiple prior visits.  I will resubmit a referral to OBGYN for your pap smear.  Please bring back your stool sample for your fecal immunohistochemical test which is a substitute for a colonoscopy to fulfill your colon cancer screening.

## 2017-08-25 NOTE — Telephone Encounter (Signed)
   Reason for call:   I received a call from Ms. Ebony Cargo at 5:30 PM indicating that [facility] and they refused to give her today as they cannot dispense before 1 month and she can get it tomorrow, patient do not want it to [facility] tomorrow as she does not drive and going to pharmacy cost her some money.   Pertinent Data:   Patient is on long-term morphine use because of her pain, gets her monthly prescription, apparently got her prescription today when seen in clinic and according to patient she was told that she can get her prescription today but [facility] refused to [facility] it [facility] until tomorrow.   Assessment / Plan / Recommendations:   I advised the patient to pick it up tomorrow morning as I will not be able to help at this time and prescription has already been sent.  Pharmacies normally do not filled up those prescriptions before 1 month.  As always, pt is advised that if symptoms worsen or new symptoms arise, they should go to an urgent care facility or to to ER for further evaluation.   Arnetha Courser, MD   08/25/2017, 5:48 PM

## 2017-08-26 ENCOUNTER — Encounter: Payer: Self-pay | Admitting: Internal Medicine

## 2017-08-26 ENCOUNTER — Telehealth: Payer: Self-pay | Admitting: *Deleted

## 2017-08-26 NOTE — Telephone Encounter (Signed)
Information was faxed to [facility] for PA for Morphine.  Angelina Ok, RN 08/26/2017 4:50 PM

## 2017-08-26 NOTE — Assessment & Plan Note (Signed)
Patient is in a heavy smoker for some time.  Smokes 1 pack/day currently, smoked more in the past.  This is her single biggest risk factor currently.  Every visit she is never receptive to quitting smoking.  This visit, she is willing to begin to try to stop smoking.    -We will prescribe patient nicotine patch to help facilitate this process.

## 2017-08-26 NOTE — Assessment & Plan Note (Addendum)
BP Readings from Last 3 Encounters:  08/25/17 (!) 143/80  08/22/17 137/87  07/29/17 133/81  bp within goal last two visits, patient reports checking her blood pressure at home and is been running low 130s to high 120s systolic.  Asked that she bring in a log of those blood pressures next visit.  -Does not want to take lisinopril at this time feels like it makes her hypotensive -With her syncope history she is very reluctant to take blood pressure medicine.

## 2017-08-26 NOTE — Assessment & Plan Note (Signed)
Lab Results  Component Value Date   CHOL 195 07/04/2017   HDL 39 (L) 07/04/2017   LDLCALC 121 (H) 07/04/2017   TRIG 173 (H) 07/04/2017   CHOLHDL 5.0 (H) 07/04/2017   Pt with high cholesterol despite atorvastatin  daily.  Pt now prediabetic, has HTN, Heavy smoking history.    -opted to switch to rosuvastatin  daily, will follow up with repeat lipid panel.

## 2017-08-26 NOTE — Assessment & Plan Note (Signed)
Pt with continued radicular pain from cervical spinal stenosis.  Continues to not follow up with neurology.    -refilled pts pain medicine morphine sulfate  TID PRN -getting urine tox screen sample

## 2017-08-26 NOTE — Assessment & Plan Note (Signed)
Still not returned stool sample, no follow-up for mammogram.  Says never contacted for OB-gyn referral for pap I doubt this is true but will resubmit referral

## 2017-08-27 LAB — TOXASSURE SELECT,+ANTIDEPR,UR

## 2017-08-27 NOTE — Progress Notes (Signed)
I saw and evaluated the patient. I personally confirmed the key portions of Dr. Huang's history and exam and reviewed pertinent patient test results. The assessment, diagnosis, and plan were formulated together and I agree with the documentation in the resident's note. 

## 2017-08-29 ENCOUNTER — Encounter: Payer: Self-pay | Admitting: Student

## 2017-08-29 ENCOUNTER — Telehealth: Payer: Self-pay | Admitting: *Deleted

## 2017-08-29 NOTE — Telephone Encounter (Signed)
I agree. Thank you.

## 2017-08-29 NOTE — Telephone Encounter (Signed)
Call made to [facility] to follow up PA request- additional info (strength/qty) needed-form refaxed to [employer].Criss Alvine, Darlene Cassady5/6/20199:10 AM

## 2017-08-29 NOTE — Telephone Encounter (Signed)
Pt calls and states she started having itching when she started taking the crestor, she stopped atking it after 3 days and the itching has just about resolved, states she started taking her old statin and will need refills in a few days, please advise

## 2017-08-29 NOTE — Telephone Encounter (Signed)
Called pt she states she cannot come back in until next week, [facility] 5/14 at 1445 per pt choice

## 2017-08-29 NOTE — Progress Notes (Signed)
Internal Medicine Clinic Attending  Case discussed with Dr. Frances Furbish  at the time of the visit.  We reviewed the resident's history and exam and pertinent patient test results.  I agree with the assessment, diagnosis, and plan of care documented in the resident's note.  Encouraging that she is now considering quitting smoking, as this would be the single most important thing we could do for her health. Will want to follow closely and use motivational interviewing to address barriers and promote success.   Anne Shutter, MD

## 2017-08-29 NOTE — Telephone Encounter (Signed)
Please have her follow up in [facility] so we can assess her symptoms better as this does not appear to be an allergic reaction since she tolerates statins in general

## 2017-09-06 ENCOUNTER — Encounter: Payer: Self-pay | Admitting: Internal Medicine

## 2017-09-06 ENCOUNTER — Ambulatory Visit: Payer: Medicaid Other

## 2017-09-12 ENCOUNTER — Telehealth: Payer: Self-pay | Admitting: *Deleted

## 2017-09-12 NOTE — Telephone Encounter (Signed)
Pt called and states she has just [facility] this [facility] on her morphine appr 45 pills, [facility] and pharmacist states he has not spoken to her but that according to their records she rec'd #90 5/3. He states their count is right She is concerned about withdrawal and ask for assistance unless a script can be done tomorrow

## 2017-09-12 NOTE — Telephone Encounter (Signed)
She can speak to the pharmacist about it if she wishes but I will not be refilling her prescription early.  She has demonstrated a consistent pattern of deceit and almost certainly has been diverting pills.  It would be unlikely she would not notice that half her pills are gone until now.  This is the same lady who counts each month out to the exact day so she is not one day short of pills.  I think she may be better served with a pain management specialist from here on out.

## 2017-09-13 ENCOUNTER — Telehealth: Payer: Self-pay | Admitting: Internal Medicine

## 2017-09-13 NOTE — Telephone Encounter (Signed)
Attempted to call pt, no answer, no vmail 

## 2017-09-13 NOTE — Telephone Encounter (Signed)
Pt wants to see a doctor, "that nice looking grey curly headed head doctor" "he is always so helpful with these things" appt 5/22 1415 [facility]

## 2017-09-13 NOTE — Telephone Encounter (Signed)
Patient is calling back, please call the number in the contacts field patient phone is off

## 2017-09-14 ENCOUNTER — Ambulatory Visit: Payer: Medicaid Other | Admitting: Internal Medicine

## 2017-09-14 ENCOUNTER — Other Ambulatory Visit: Payer: Self-pay

## 2017-09-14 VITALS — BP 184/78 | HR 80 | Temp 98.7°F | Ht 66.5 in | Wt 202.7 lb

## 2017-09-14 DIAGNOSIS — Z79899 Other long term (current) drug therapy: Secondary | ICD-10-CM

## 2017-09-14 DIAGNOSIS — G894 Chronic pain syndrome: Secondary | ICD-10-CM | POA: Diagnosis present

## 2017-09-14 DIAGNOSIS — I10 Essential (primary) hypertension: Secondary | ICD-10-CM | POA: Diagnosis not present

## 2017-09-14 DIAGNOSIS — Z9119 Patient's noncompliance with other medical treatment and regimen: Secondary | ICD-10-CM

## 2017-09-14 DIAGNOSIS — M5412 Radiculopathy, cervical region: Secondary | ICD-10-CM

## 2017-09-14 DIAGNOSIS — Z8781 Personal history of (healed) traumatic fracture: Secondary | ICD-10-CM | POA: Diagnosis not present

## 2017-09-14 DIAGNOSIS — Z79891 Long term (current) use of opiate analgesic: Secondary | ICD-10-CM

## 2017-09-14 DIAGNOSIS — F17210 Nicotine dependence, cigarettes, uncomplicated: Secondary | ICD-10-CM | POA: Diagnosis not present

## 2017-09-14 DIAGNOSIS — E785 Hyperlipidemia, unspecified: Secondary | ICD-10-CM

## 2017-09-14 MED ORDER — MORPHINE SULFATE 15 MG PO TABS: ORAL_TABLET | ORAL | 0 refills | Status: AC

## 2017-09-14 NOTE — Progress Notes (Addendum)
CC: "I need more pills"  HPI:  [name] is a 62 y.o. female with PMH below.  She is here for more morphine sulfate tablets.  She is also willing to address her problems with statin therapy for her hyperlipidemia.    Please see A&P for status of the patient's chronic medical conditions  Past Medical History:  Diagnosis Date  . Anxiety   . Chronic kidney disease, stage 3 (HCC) 06/27/2014  . Chronic pain syndrome 01/02/2014  . Dehydration   . Depression   . DVT (deep venous thrombosis) (HCC) 01/12/2011   DVT noted in R peroneal vein 01/11/11, started on lovenox & coumadin-1st episode. Treated for approx 6 mo. Reoccurrence 04/2013, on life-long Coumadin now.   . H/O dental abscess 10/05/2012  . 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 Qualifier: Diagnosis of  By: Eben Burow MD, Ankit     . Hypertension   . Low back pain   . Thigh cramp 03/08/2016   Review of Systems:  ROS: Pulmonary: pt denies increased work of breathing, shortness of breath,  Cardiac: pt denies palpitations, chest pain,  Abdominal: pt denies abdominal pain, nausea, vomiting, or diarrhea  Physical Exam:  Vitals:   09/14/17 1414  BP: (!) 184/78  Pulse: 80  Temp: 98.7 F (37.1 C)  TempSrc: Oral  SpO2: 100%  Weight: 202 lb 11.2 oz (91.9 kg)  Height: 5' 6.5" (1.689 m)   Physical Exam  Constitutional: No distress.  Cardiovascular: Normal rate, regular rhythm and normal heart sounds. Exam reveals no gallop and no friction rub.  No murmur heard. Pulmonary/Chest: Effort normal and breath sounds normal. No respiratory distress. She has no wheezes. She has no rales. She exhibits no tenderness.  Abdominal: Soft. Bowel sounds are normal. She exhibits no distension and no mass. There is no tenderness. There is no rebound and no guarding.  Neurological: She is alert.  Skin: She is not diaphoretic.    Social  History   Socioeconomic History  . Marital status: Married    Spouse name: Not on file  . Number of children: Not on file  . Years of education: Not on file  . Highest education level: Not on file  Occupational History  . Occupation: former hairdresser    Associate Professor: UNEMPLOYED  Social Needs  . Financial resource strain: Not on file  . Food insecurity:    Worry: Not on file    Inability: Not on file  . Transportation needs:    Medical: Not on file    Non-medical: Not on file  Tobacco Use  . Smoking status: Current Every Day Smoker    Packs/day: 0.50    Types: Cigarettes  . Smokeless tobacco: Never Used  . Tobacco comment: Stress lately.  Waiting to do with husband  Substance and Sexual Activity  . Alcohol use: No    Alcohol/week: 0.0 oz  . Drug use: No  . Sexual activity: Not on file  Lifestyle  . Physical activity:    Days per week: Not on file    Minutes per session: Not on file  . Stress: Not on file  Relationships  . Social connections:    Talks on phone: Not on file    Gets together: Not on file    Attends religious service: Not on file    Active member of club or organization: Not on file    Attends meetings of clubs or  organizations: Not on file    Relationship status: Not on file  . Intimate partner violence:    Fear of current or ex partner: Not on file    Emotionally abused: Not on file    Physically abused: Not on file    Forced sexual activity: Not on file  Other Topics Concern  . 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      Financial assistance approved by Rudell Cobb for 100 discount at [facility] and has [facility] card, Nov 4th 2011.    Family History  Adopted: Yes    Assessment & Plan:   See Encounters Tab for problem based charting.  Patient discussed with Dr. Heide Spark

## 2017-09-14 NOTE — Patient Instructions (Signed)
We will taper your pain medicine to prevent you from withdrawal.  Please take as prescribed.  Please try lipitor again at the lower dose and see if you still have itching.

## 2017-09-15 NOTE — Assessment & Plan Note (Signed)
Pt hypertensive today, upset, claims she hasn't taken her morphine in 2 days as well.  Still not willing to take blood pressure medicine.  -will follow up after taper

## 2017-09-15 NOTE — Assessment & Plan Note (Signed)
Patient reports itchiness when switching to rosuvastatin, so she stopped taking it. She is unsure if she also had itchiness on the atorvastatin. She reports no myalgias or other symptoms.  I expect her pruritis is more likely related to her opioid use.  She had liver function testing last visit which was normal.    -pt agreed to restart the atorvastatin  daily -will recheck liver function during next visit.

## 2017-09-15 NOTE — Assessment & Plan Note (Addendum)
Patient states the pharmacy shorted her 45 pills on her last prescription but she only noticed halfway through the month when she had about 6 pills left.  The pharmacy was called and they verified that the patient was given 90 pills and they have a strict count and there is no way she received only 45 pills.  There have been several aberrancies in regards to the patient's pain management here at our clinic. There is concern that the patient has been diverting her pills.  Additionally, on multiple occasions she has stated she is able to get extra pills from her now ex husband on a consistent basis.  She mentions this again during this visit and that she would come here first in an attempt to do the right thing before  going to her ex husband to get them.  She broke her pain contract last year, immediately after she was admitted for a syncopal episode.  At that time the patient was retested and found to be opioid positive and in the inpatient service's mind verified that she was supposed to be on morphine sulfate.  Therefore the patient was restarted on pain meds on the inpatient service and discharged with a 30 day supply.  This occurred before I had my first encounter with the patient and was found on chart review.  Additionally, I have received a report from a colleague that takes care of her husband that she was "stealing pills from her husband" according to her husband at the time.  Just recently she was seen in the ED for a wrist fracture and required increased pain medicine. Tylenol with codeine from the [facility] was also given for increased pain and running out of pills due to pain on 4/29.  I have referred the patient to neurology in the past and spoken with her several times about going and getting a nerve conduction study to assess the degree of her cervical radiculopathy and continue to work up the issue for potential reversibility.  Unfortunately the patient has refused and has ignored the referrals which have  been ordered electronically and been given to her physically in the office.  The patient will need to be tapered off opioids today for her own safety and to prevent further abberrancy and violations.  -will give pt a two week taper of morphine sulfate.  When pt was told this, she did not seem worried at all and accepted the decision indicating she likely has a large supply of pills and access to more.   -She is not to be prescribed any further opioid prescriptions from this clinic.

## 2017-09-16 NOTE — Progress Notes (Signed)
Internal Medicine Clinic Attending  Case discussed with Dr. Frances Furbish at the time of the visit.  We reviewed the resident's history and exam and pertinent patient test results.  I agree with the assessment, diagnosis, and plan of care documented in the resident's note.  Patient has violated her pain contract on multiple occasions and admits to taking pills from other sources as well.  Will give her a short taper of pain medication at this time.  Patient will not be prescribed any further pain medication prescriptions from our clinic.

## 2017-09-28 ENCOUNTER — Encounter: Payer: Medicaid Other | Admitting: Student

## 2017-09-28 ENCOUNTER — Encounter: Payer: Self-pay | Admitting: General Practice

## 2017-09-28 NOTE — Addendum Note (Signed)
Addended by: Neomia DearPOWERS, Lakya Schrupp E on: 09/28/2017 04:44 PM   Modules accepted: Orders

## 2017-09-29 ENCOUNTER — Telehealth: Payer: Self-pay | Admitting: Internal Medicine

## 2017-09-29 NOTE — Telephone Encounter (Signed)
Please call patient back .  rosuvastatin (CRESTOR) 20 MG tablet  Medication she recently started is making her itch.  Pt does not want to take it.

## 2017-09-30 NOTE — Telephone Encounter (Signed)
We have already discussed this in detail and is available on the after visit instructions. Will call sometime next week

## 2017-10-26 ENCOUNTER — Encounter: Payer: Self-pay | Admitting: Internal Medicine

## 2017-12-03 ENCOUNTER — Other Ambulatory Visit: Payer: Self-pay | Admitting: Internal Medicine

## 2017-12-06 NOTE — Telephone Encounter (Signed)
refilled 

## 2018-01-19 ENCOUNTER — Other Ambulatory Visit: Payer: Self-pay | Admitting: Internal Medicine

## 2018-01-19 NOTE — Telephone Encounter (Signed)
Refilled, pt needs next available appointment with me for change of PCP appointment

## 2018-02-07 ENCOUNTER — Other Ambulatory Visit (HOSPITAL_COMMUNITY): Payer: Self-pay | Admitting: Family Medicine

## 2018-02-07 DIAGNOSIS — E118 Type 2 diabetes mellitus with unspecified complications: Secondary | ICD-10-CM

## 2018-02-13 ENCOUNTER — Encounter: Payer: Medicaid Other | Admitting: Internal Medicine

## 2018-02-13 ENCOUNTER — Encounter: Payer: Self-pay | Admitting: Internal Medicine

## 2018-04-20 ENCOUNTER — Other Ambulatory Visit: Payer: Self-pay | Admitting: Internal Medicine

## 2018-04-21 NOTE — Telephone Encounter (Signed)
Refilled one month supply, continues to no show for change of PCP appointment

## 2018-05-20 ENCOUNTER — Other Ambulatory Visit: Payer: Self-pay | Admitting: Internal Medicine

## 2018-05-23 NOTE — Telephone Encounter (Signed)
Will do one month further.  Pt continues to miss our change of PCP appointments consider assigning without this appointment vs dismissal from our clinic.

## 2018-05-24 ENCOUNTER — Ambulatory Visit: Payer: Medicaid Other

## 2018-05-24 ENCOUNTER — Encounter: Payer: Self-pay | Admitting: Internal Medicine

## 2018-05-30 ENCOUNTER — Encounter: Payer: Self-pay | Admitting: Internal Medicine

## 2018-09-13 ENCOUNTER — Other Ambulatory Visit: Payer: Self-pay | Admitting: Internal Medicine

## 2019-08-18 IMAGING — DX DG WRIST COMPLETE 3+V*L*
4 series · 4 of 4 positions shown · non-contrast
Comparison: None.

CLINICAL DATA: Pain after trauma

EXAM:
LEFT WRIST - COMPLETE 3+ VIEW

[wrist pa]
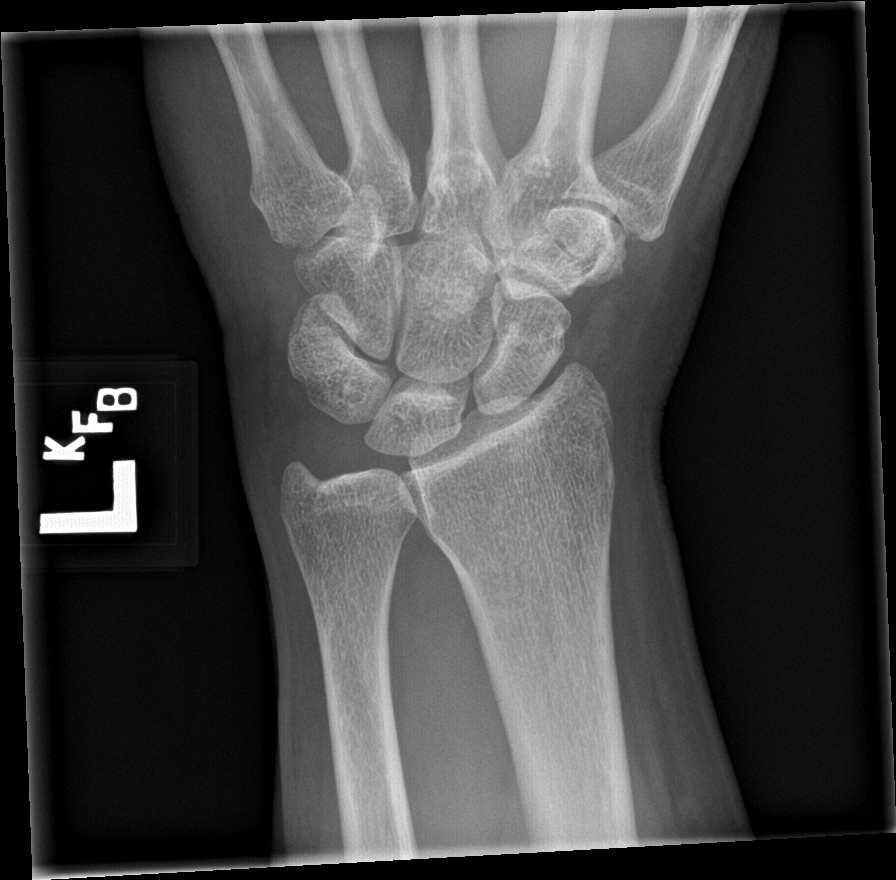

[wrist obl]
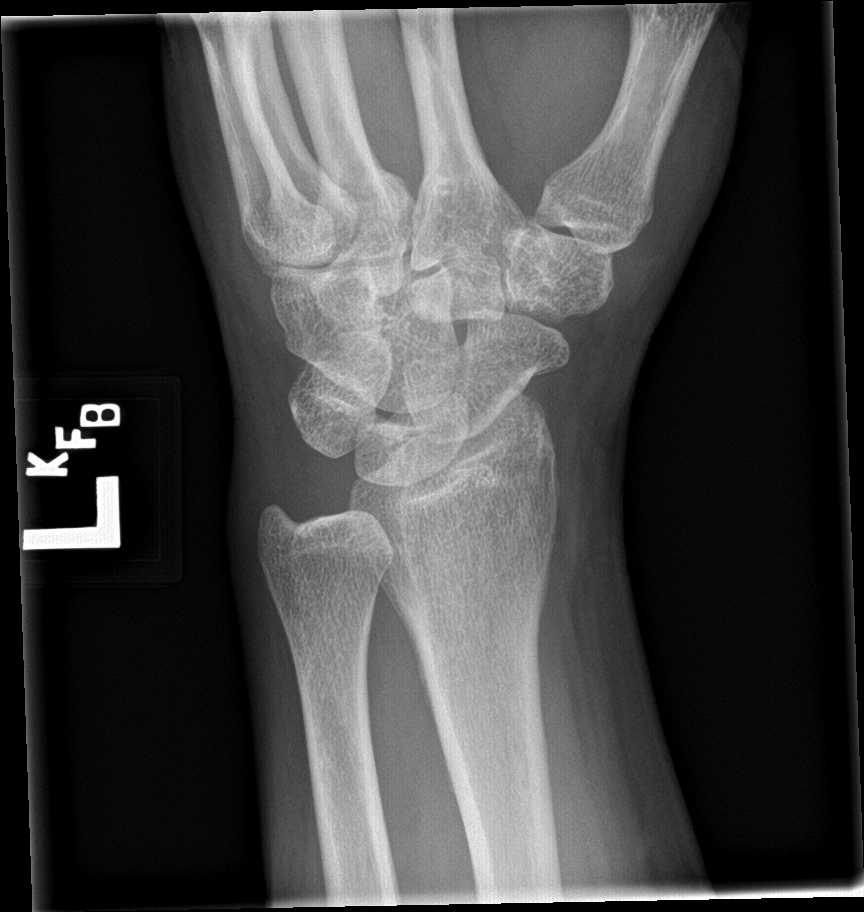

[wrist lat]
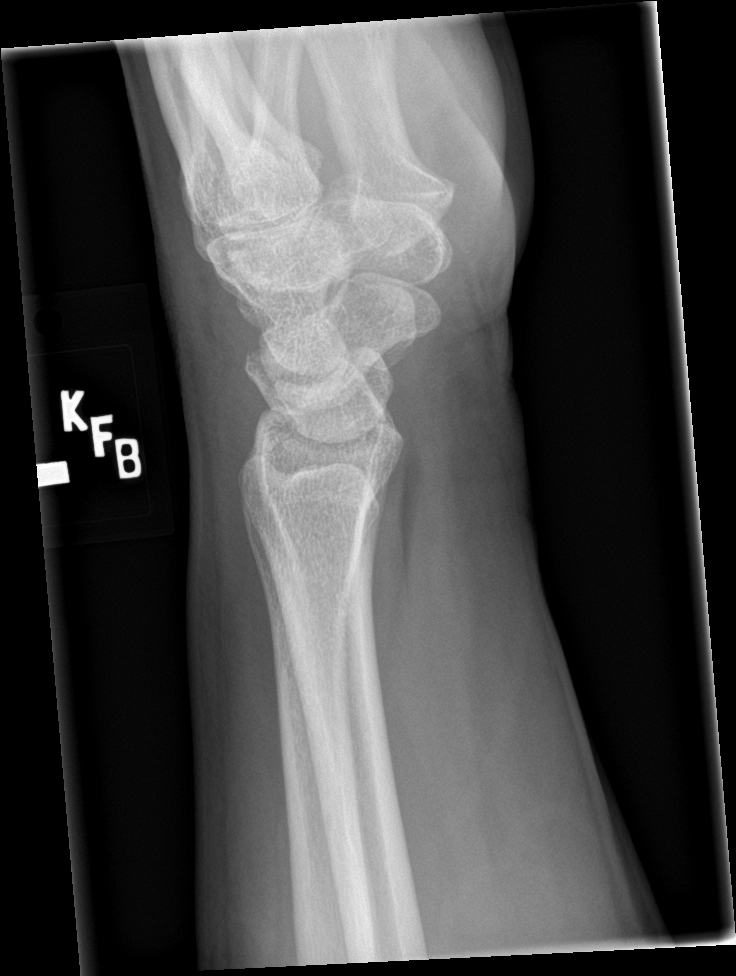

[wrist navicular]
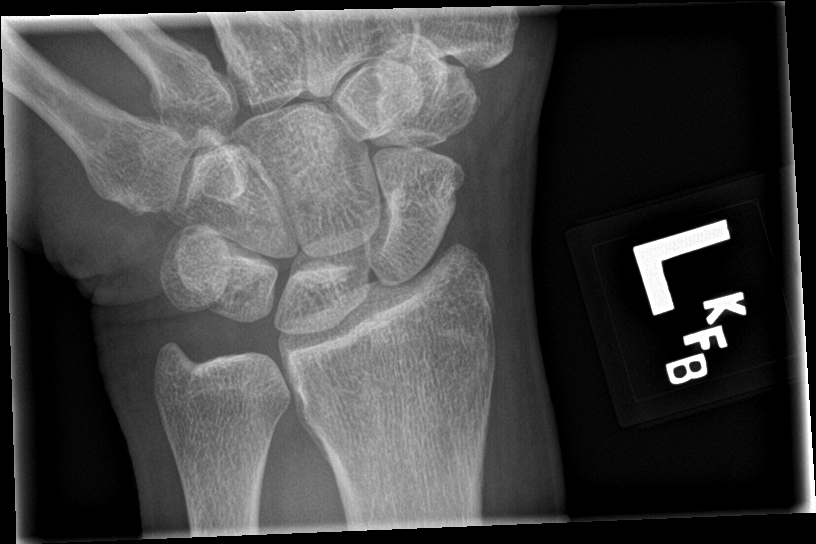

[4 of 4 positions shown; findings below may reference images not displayed]

FINDINGS: There is no evidence of fracture or dislocation. There is no
evidence of arthropathy or other focal bone abnormality. Soft
tissues are unremarkable.
IMPRESSION: Negative.

## 2019-08-18 IMAGING — DX DG ELBOW COMPLETE 3+V*L*
4 series · 4 of 4 positions shown · non-contrast
Comparison: None.

CLINICAL DATA: Pain after fall.

EXAM:
LEFT ELBOW - COMPLETE 3+ VIEW

[elbow ap]
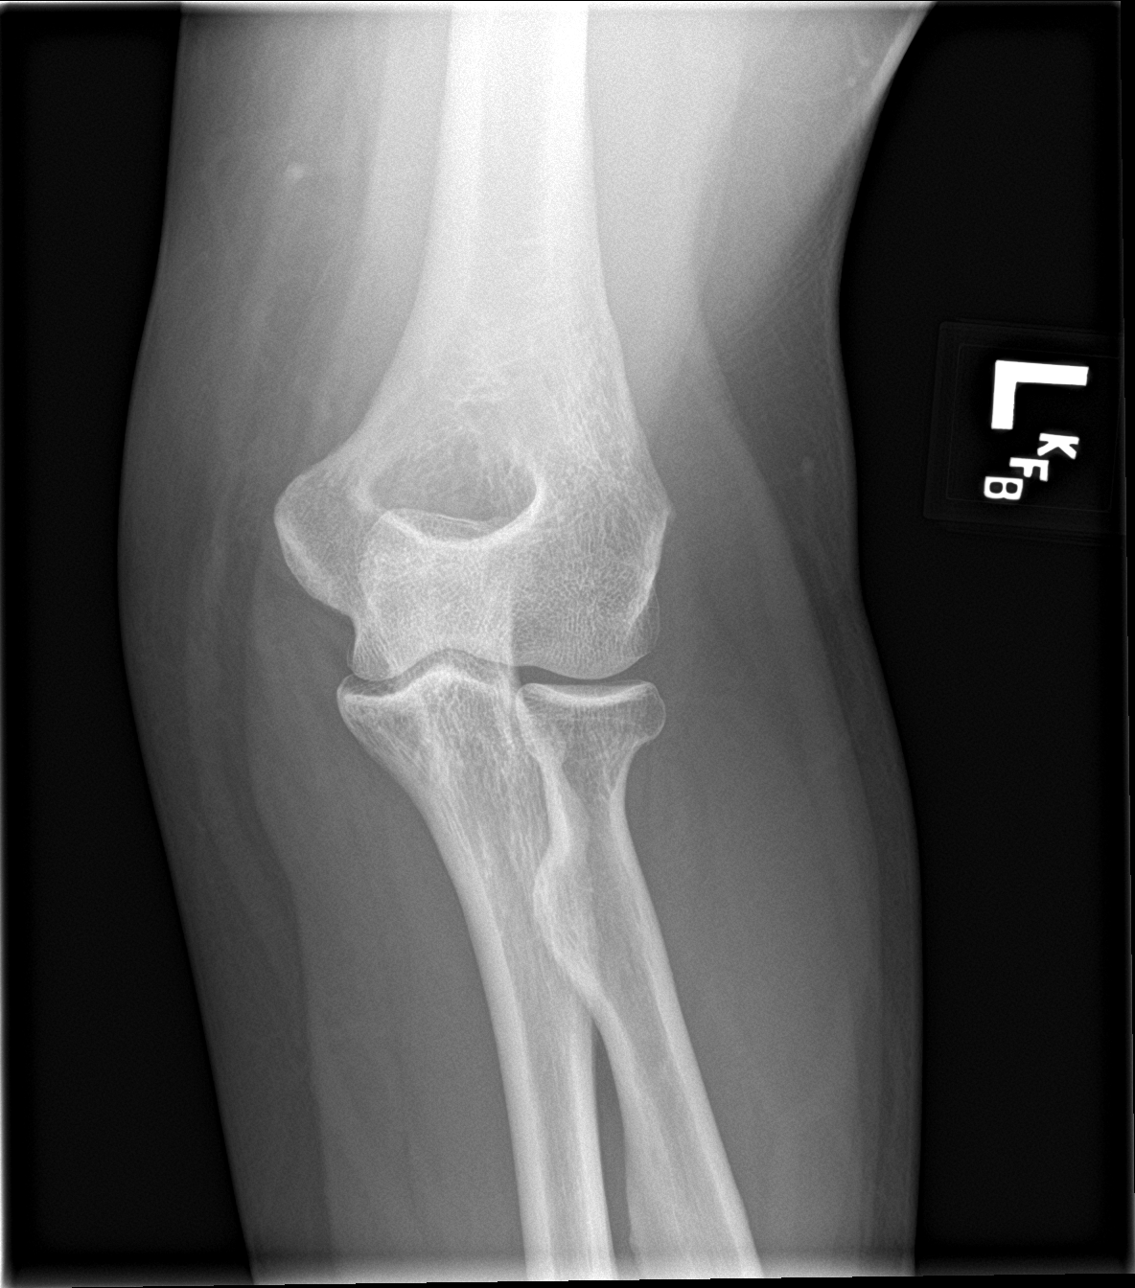

[elbow obl (1 of 2)]
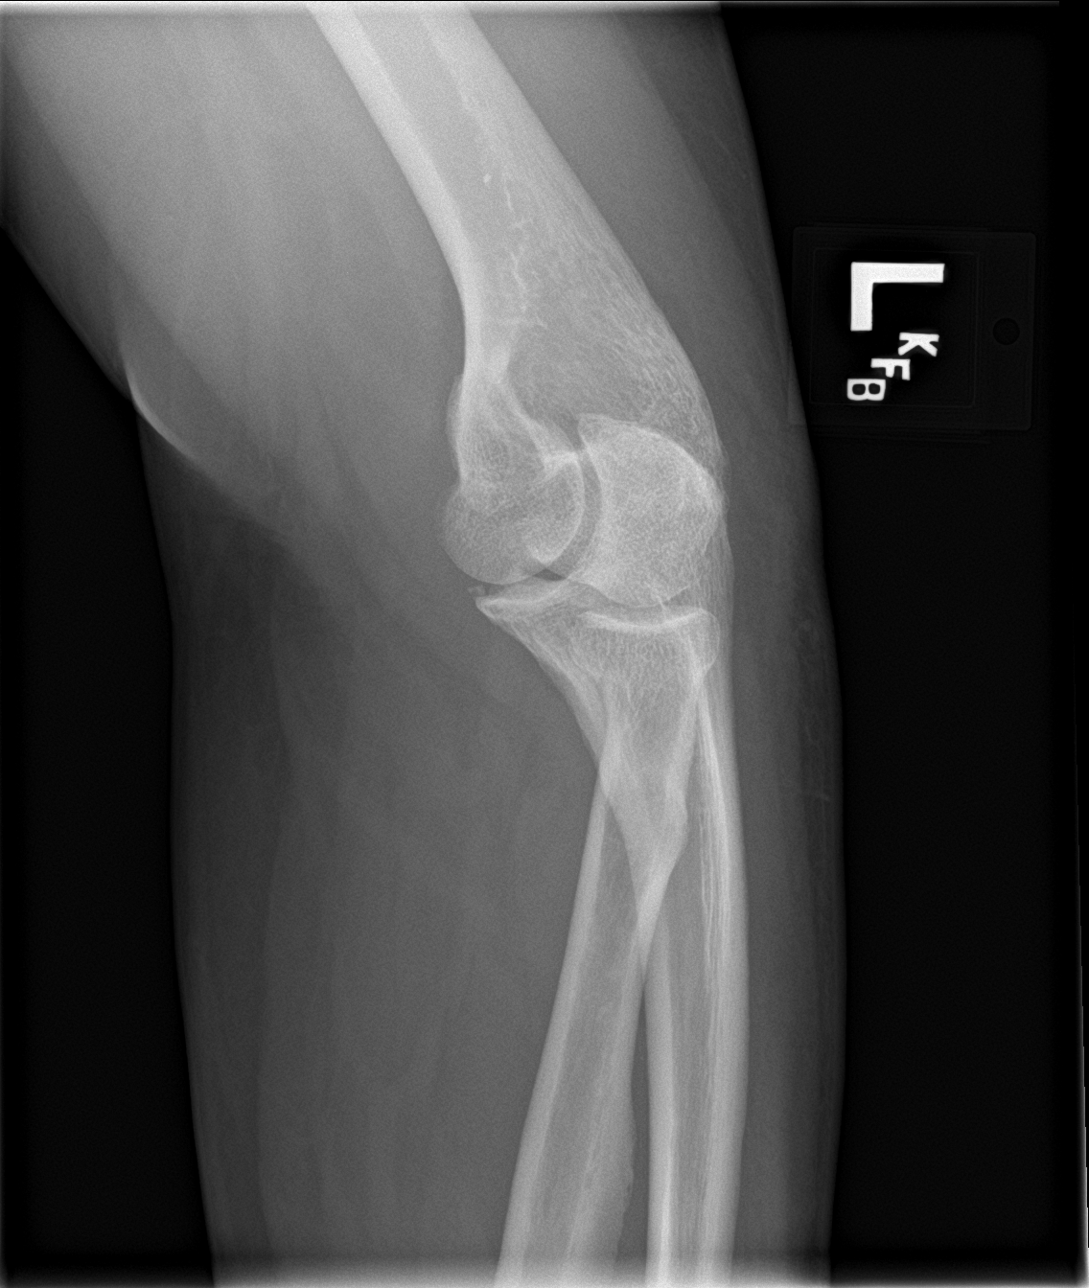

[elbow obl (2 of 2)]
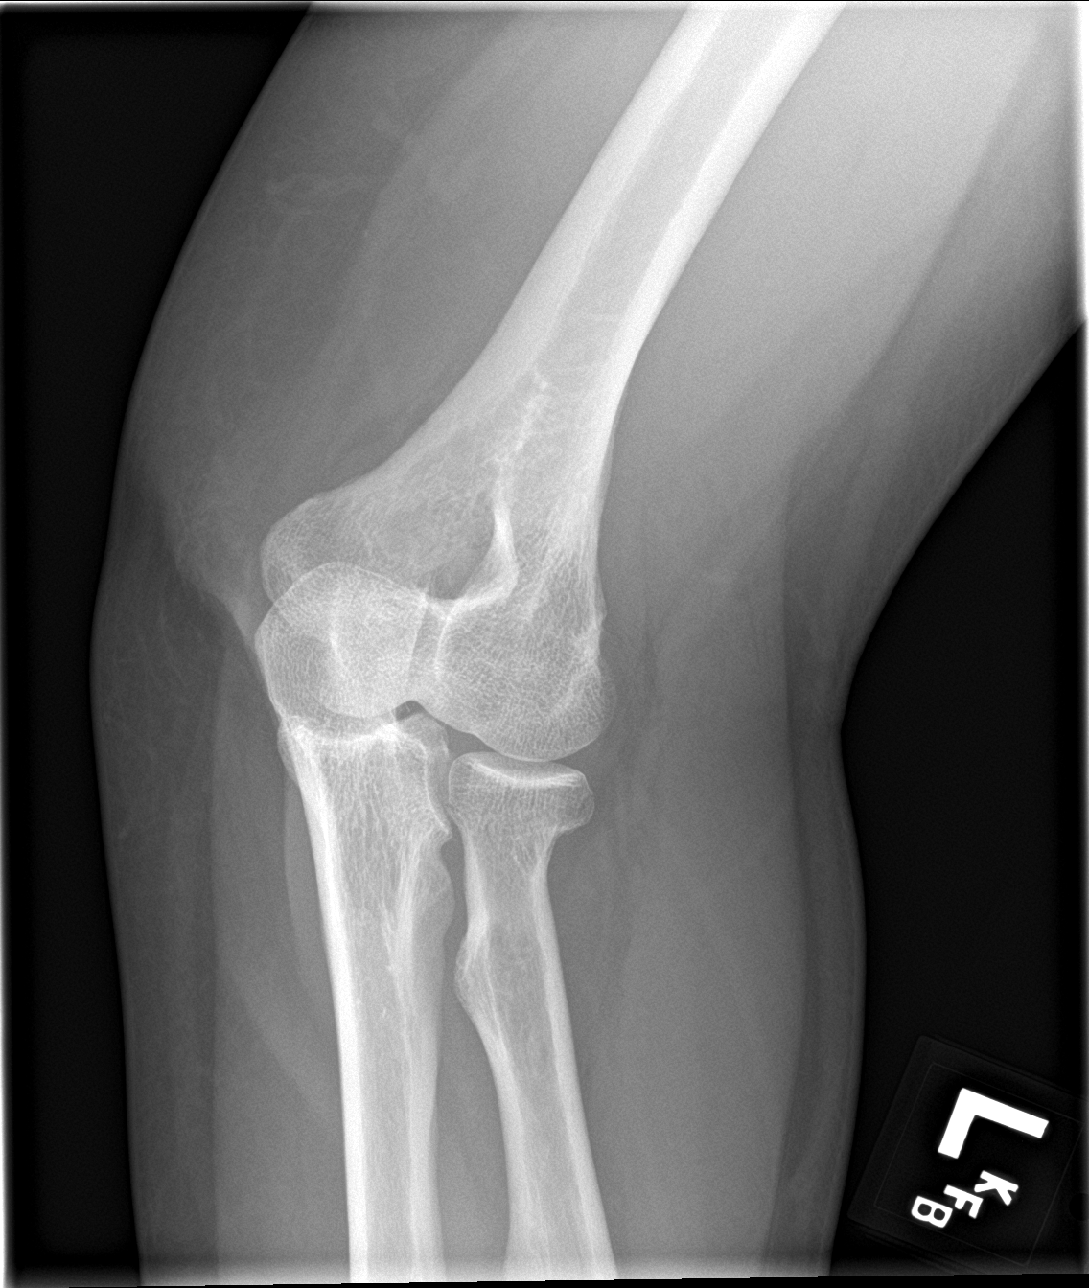

[elbow lat]
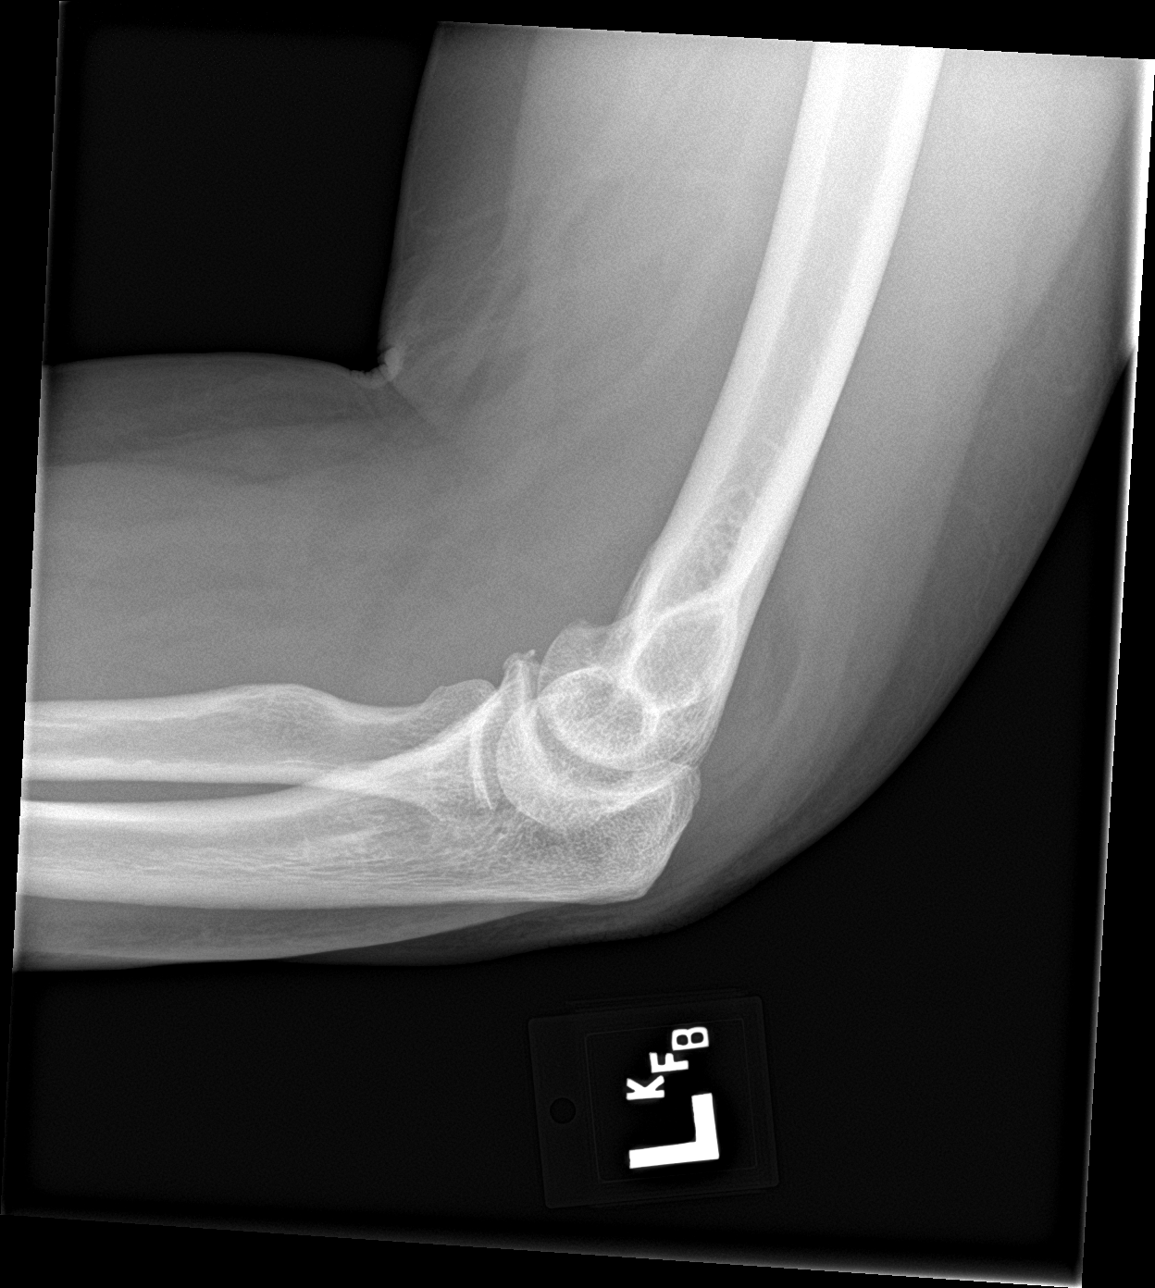

[4 of 4 positions shown; findings below may reference images not displayed]

FINDINGS: There is a nondisplaced fracture through the radial head best seen
on oblique imaging. The distal humerus and proximal ulna are intact.
A joint effusion is identified, consistent with the visualized
fracture.
IMPRESSION: Nondisplaced fracture through the radial head.  Joint effusion.

## 2021-03-26 ENCOUNTER — Other Ambulatory Visit: Payer: Self-pay | Admitting: Nurse Practitioner

## 2021-03-26 DIAGNOSIS — Z1231 Encounter for screening mammogram for malignant neoplasm of breast: Secondary | ICD-10-CM

## 2021-05-19 ENCOUNTER — Ambulatory Visit: Payer: Medicaid Other

## 2021-05-20 ENCOUNTER — Ambulatory Visit: Payer: Medicaid Other

## 2021-05-27 ENCOUNTER — Ambulatory Visit: Payer: Self-pay

## 2021-06-10 ENCOUNTER — Ambulatory Visit: Payer: Self-pay

## 2021-06-19 ENCOUNTER — Ambulatory Visit: Payer: Self-pay
